# Patient Record
Sex: Female | Born: 1950 | Race: White | Hispanic: No | Marital: Single | Smoking: Former smoker
Health system: Southern US, Community
[De-identification: ages and names within clinical notes are randomized; demographics above are authoritative.]

## PROBLEM LIST (undated history)

## (undated) DIAGNOSIS — M1990 Unspecified osteoarthritis, unspecified site: Secondary | ICD-10-CM

## (undated) DIAGNOSIS — E785 Hyperlipidemia, unspecified: Secondary | ICD-10-CM

## (undated) DIAGNOSIS — K219 Gastro-esophageal reflux disease without esophagitis: Secondary | ICD-10-CM

## (undated) DIAGNOSIS — N840 Polyp of corpus uteri: Secondary | ICD-10-CM

## (undated) DIAGNOSIS — T884XXA Failed or difficult intubation, initial encounter: Secondary | ICD-10-CM

## (undated) DIAGNOSIS — F419 Anxiety disorder, unspecified: Secondary | ICD-10-CM

## (undated) DIAGNOSIS — R600 Localized edema: Secondary | ICD-10-CM

## (undated) HISTORY — DX: Polyp of corpus uteri: N84.0

## (undated) HISTORY — DX: Morbid (severe) obesity due to excess calories: E66.01

## (undated) HISTORY — PX: BREAST SURGERY: SHX581

## (undated) HISTORY — PX: KNEE ARTHROSCOPY: SUR90

## (undated) HISTORY — PX: DILATION AND CURETTAGE OF UTERUS: SHX78

## (undated) HISTORY — PX: HYSTEROSCOPY: SHX211

## (undated) HISTORY — PX: JOINT REPLACEMENT: SHX530

## (undated) HISTORY — PX: TONSILLECTOMY: SUR1361

## (undated) HISTORY — PX: BREAST CYST EXCISION: SHX579

---

## 2004-09-19 ENCOUNTER — Ambulatory Visit: Payer: Self-pay | Admitting: General Practice

## 2005-04-11 ENCOUNTER — Ambulatory Visit: Payer: Self-pay | Admitting: Anesthesiology

## 2006-09-23 ENCOUNTER — Ambulatory Visit: Payer: Self-pay | Admitting: Family Medicine

## 2007-02-19 ENCOUNTER — Ambulatory Visit: Payer: Self-pay | Admitting: General Practice

## 2007-07-06 ENCOUNTER — Ambulatory Visit: Payer: Self-pay | Admitting: General Practice

## 2009-05-15 ENCOUNTER — Ambulatory Visit: Payer: Self-pay | Admitting: General Practice

## 2009-05-30 ENCOUNTER — Inpatient Hospital Stay: Payer: Self-pay | Admitting: General Practice

## 2009-10-20 ENCOUNTER — Ambulatory Visit: Payer: Self-pay | Admitting: General Practice

## 2013-04-29 ENCOUNTER — Ambulatory Visit: Payer: Self-pay | Admitting: Internal Medicine

## 2016-10-24 ENCOUNTER — Encounter: Payer: Self-pay | Admitting: Certified Nurse Midwife

## 2016-10-24 ENCOUNTER — Ambulatory Visit (INDEPENDENT_AMBULATORY_CARE_PROVIDER_SITE_OTHER): Payer: Medicare HMO | Admitting: Certified Nurse Midwife

## 2016-10-24 VITALS — BP 129/56 | HR 83 | Ht 64.0 in | Wt 312.0 lb

## 2016-10-24 DIAGNOSIS — N950 Postmenopausal bleeding: Secondary | ICD-10-CM | POA: Insufficient documentation

## 2016-10-24 DIAGNOSIS — Z124 Encounter for screening for malignant neoplasm of cervix: Secondary | ICD-10-CM

## 2016-10-24 HISTORY — DX: Morbid (severe) obesity due to excess calories: E66.01

## 2016-10-24 NOTE — Patient Instructions (Signed)
Postmenopausal Bleeding Postmenopausal bleeding is any bleeding after menopause. Menopause is when a woman's period stops. Any type of bleeding after menopause is concerning. It should be checked by your doctor. Any treatment will depend on the cause. Follow these instructions at home: Watch your condition for any changes.  Avoid the use of tampons and douches as told by your doctor.  Change your pads often.  Get regular pelvic exams and Pap tests.  Keep all appointments for tests as told by your doctor.  Contact a doctor if:  Your bleeding lasts for more than 1 week.  You have belly (abdominal) pain.  You have bleeding after sex (intercourse). Get help right away if:  You have a fever, chills, a headache, dizziness, muscle aches, and bleeding.  You have strong pain with bleeding.  You have clumps of blood (blood clots) coming from your vagina.  You have bleeding and need more than 1 pad an hour.  You feel like you are going to pass out (faint). This information is not intended to replace advice given to you by your health care provider. Make sure you discuss any questions you have with your health care provider. Document Released: 03/12/2008 Document Revised: 11/09/2015 Document Reviewed: 12/31/2012 Elsevier Interactive Patient Education  2017 Elsevier Inc.  

## 2016-10-24 NOTE — Progress Notes (Signed)
New pt is here with c/o PMB. Started slow beginning of May and became heavier around 10/18/16 X1 week. Denies pain. LPS unknown.

## 2016-10-24 NOTE — Progress Notes (Addendum)
GYN ENCOUNTER NOTE  Subjective:       Michelle Marks is a 66 y.o. G37P1001 female is here for gynecologic evaluation of the following issues: Post menopausal bleeding. [name] became menopausal in her early forty's. On 5/4-5/5 had 2 heavy days of vaginal bleeding that then got lighter and has now stopped. She denies any cramps or aggrevating factors.  She states that she first thought it was blood in her urine. She went to her primary care to have her urine evaluated. She was then referred to [facility] for further evaluation.     Gynecologic History No LMP recorded. Patient is postmenopausal. Contraception: none Last Pap:" 6-8 yrs ago". Results were: normal per pt.  She denies any family history of endometrial cancer.   Obstetric Historyd OB History  Gravida Para Term Preterm AB Living  1 1 1     1   SAB TAB Ectopic Multiple Live Births          1    # Outcome Date GA Lbr Len/2nd Weight Sex Delivery Anes PTL Lv  1 Term 31    M Vag-Spont   LIV      History reviewed. No pertinent past medical history.  Past Surgical History:  Procedure Laterality Date  . DILATION AND CURETTAGE OF UTERUS    . HYSTEROSCOPY      No current outpatient prescriptions on file prior to visit.   No current facility-administered medications on file prior to visit.     Allergies  Allergen Reactions  . Sulfa Antibiotics     Social History   Social History  . Marital status: Single    Spouse name: N/A  . Number of children: N/A  . Years of education: N/A   Occupational History  . Not on file.   Social History Main Topics  . Smoking status: Former Games developer  . Smokeless tobacco: Never Used  . Alcohol use Yes     Comment: occas  . Drug use: No  . Sexual activity: Not Currently   Other Topics Concern  . Not on file   Social History Narrative  . No narrative on file    Family History  Problem Relation Age of Onset  . Cancer Mother   . Diabetes Mother   . Hypertension Mother   .  Cancer Sister     The following portions of the patient's history were reviewed and updated as appropriate: allergies, current medications, past family history, past medical history, past social history, past surgical history and problem list.  Review of Systems Review of Systems - General ROS: negative for - chills, fatigue, fever, hot flashes, malaise or night sweats Hematological and Lymphatic ROS: negative for - bleeding problems or swollen lymph nodes Gastrointestinal ROS: negative for - abdominal pain, blood in stools, change in bowel habits and nausea/vomiting Musculoskeletal ROS: negative for - joint pain, muscle pain or muscular weakness Genito-Urinary ROS: positive for post menopausal bleeding  Objective:   BP (!) 129/56   Pulse 83   Ht 5\' 4"  (1.626 m)   Wt (!) 312 lb (141.5 kg)   BMI 53.55 kg/m  CONSTITUTIONAL: Well-developed, morbidly obese female in no acute distress.  HENT:  Normocephalic, atraumatic.  NECK: Normal range of motion SKIN: Skin is warm and dry. No rash noted. Not diaphoretic. No erythema. No pallor. NEUROLGIC: Alert and oriented to person, place, and time.  PSYCHIATRIC: Normal mood and affect. Normal behavior. Normal judgment and thought content. CARDIOVASCULAR:Not Examined RESPIRATORY: Not Examined BREASTS: Not Examined  ABDOMEN: Soft, non distended; Non tender. Exam compromised due to body habitus PELVIC:  External Genitalia: Normal  Vagina: Normal, no blood visualized.   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 use of cane and swelling bilaterally on both legs.   Assessment:   Post menopausal bleeding   Plan:  Pap smear today, Schedule ultrasound as soon as possible. Discuss possible need for  endometrial biopsy. Will follow up with results.   Doreene BurkeAnnie Adri Schloss, CNM

## 2016-10-25 ENCOUNTER — Other Ambulatory Visit: Payer: Self-pay | Admitting: Certified Nurse Midwife

## 2016-10-25 DIAGNOSIS — N950 Postmenopausal bleeding: Secondary | ICD-10-CM

## 2016-10-29 ENCOUNTER — Encounter: Payer: Self-pay | Admitting: Certified Nurse Midwife

## 2016-10-29 LAB — PAP IG W/ RFLX HPV ASCU: PAP SMEAR COMMENT: 0

## 2016-10-30 ENCOUNTER — Ambulatory Visit (INDEPENDENT_AMBULATORY_CARE_PROVIDER_SITE_OTHER): Payer: Medicare HMO

## 2016-10-30 DIAGNOSIS — N950 Postmenopausal bleeding: Secondary | ICD-10-CM

## 2016-11-06 ENCOUNTER — Encounter: Payer: Self-pay | Admitting: Obstetrics and Gynecology

## 2016-11-06 ENCOUNTER — Ambulatory Visit (INDEPENDENT_AMBULATORY_CARE_PROVIDER_SITE_OTHER): Payer: Medicare HMO | Admitting: Obstetrics and Gynecology

## 2016-11-06 VITALS — BP 142/84 | HR 86 | Ht 64.0 in | Wt 312.0 lb

## 2016-11-06 DIAGNOSIS — N950 Postmenopausal bleeding: Secondary | ICD-10-CM | POA: Diagnosis not present

## 2016-11-06 NOTE — Progress Notes (Signed)
GYNECOLOGY PROGRESS NOTE  Subjective:    Patient ID: Michelle Marks, female    DOB: 1951-01-17, 66 y.o.   MRN: [id number]  HPI  Patient is a 66 y.o. G42P1001 female who presents for follow up of ultrasound results and further evaluation for postmenopausal bleeding. Patient was referred from Michelle Marks, CNM. Patient notes that she has had no further episodes since early May when the initial episode occurred.   The following portions of the patient's history were reviewed and updated as appropriate: allergies, current medications, past family history, past medical history, past social history, past surgical history and problem list.  Review of Systems Pertinent items noted in HPI and remainder of comprehensive ROS otherwise negative.   Objective:   Blood pressure (!) 142/84, pulse 86, height 5\' 4"  (1.626 m), weight (!) 312 lb (141.5 kg). Body mass index is 53.55 kg/m.  General appearance: alert and no distress, morbid obesity Abdomen: soft, non-tender; bowel sounds normal; no masses,  no organomegaly Pelvic: external genitalia normal, rectovaginal septum normal.  Vagina without discharge.  Cervix normal appearing, no lesions and no motion tenderness.  Uterus mobile, nontender, normal shape and size.  Adnexae non-palpable, nontender bilaterally.  Extremities: extremities normal, atraumatic, no cyanosis or edema Neurologic: Grossly normal    Cytology:  Pap (10/24/2016): NEGATIVE FOR INTRAEPITHELIAL LESION AND MALIGNANCY.    Imaging (Pelvic Ultrasound 10/30/2016):  ULTRASOUND REPORT  Location: [facility] Date of Service: 10/30/16   Indications: PMB Findings:  The uterus is anteverted and measures 9.8 x 5.9 x 7.0 cm, slightly enlarged for age. Echo texture is homogenous without evidence of focal masses.  The Endometrium is thickened and measures 26.4 mm.  Right and left ovaries were not visualized. Survey of the adnexa demonstrates no adnexal  masses. There is no free fluid in the cul de sac.  * Exam limited by patient body habitus*  Impression: 1. Thickened endometrium in a post menopausal patient.   Recommendations: 1.Clinical correlation with the patient's History and Physical Exam. 2. Patient to follow up with Dr. Valentino Saxon for possible endometrial biopsy.   Assessment:   Postmenopausal bleeding Morbid obesity  Plan:   Discussed etiologies of postmenopausal bleeding, concern about precancerous/hyperplasia or cancerous etiology (5 to 10% percent of cases). Also discussed role of unopposed estrogen exposure in leading to thickened or proliferative endometrium; and its possible correlation with endometrial hyperplasia/carcinoma.  Discussed that obesity is linked to endometrial pathology given that adipose cells produce extra estrogen (estrone) which can cause the endometrium to have a significant amount of estrogen exposure.  However, she was reassured that endometrial atrophy and endometrial polyps are the most common causes of postmenopausal bleeding.  Uterine bleeding in postmenopausal women is usually light and self-limited. Exclusion of cancer is the main objective; therefore, treatment is usually unnecessary once cancer has been excluded.  Discussed the need to evaluate with endometrial biopsy.     Endometrial Biopsy Procedure Note  The patient is positioned on the exam table in the dorsal lithotomy position. Bimanual exam confirms uterine position and size. A Graves speculum is placed into the vagina. A single toothed tenaculum is placed onto the anterior lip of the cervix. The pipette is placed into the endocervical canal and is advanced to the uterine fundus. Using a piston like technique, with vacuum created by withdrawing the stylus, the endometrial specimen is obtained and transferred to the biopsy container. Minimal bleeding is encountered. The procedure is well tolerated.   Uterine Position: posterior  Uterine  Length: 7  cm   Uterine Specimen:  Average   Post procedure instructions are given. The patient is scheduled for follow up appointment.     [name], [name], MD [facility]

## 2016-11-06 NOTE — Patient Instructions (Addendum)

## 2016-11-08 LAB — PATHOLOGY

## 2016-11-15 ENCOUNTER — Ambulatory Visit (INDEPENDENT_AMBULATORY_CARE_PROVIDER_SITE_OTHER): Payer: Medicare HMO | Admitting: Obstetrics and Gynecology

## 2016-11-15 VITALS — BP 138/69 | HR 89

## 2016-11-15 DIAGNOSIS — N950 Postmenopausal bleeding: Secondary | ICD-10-CM

## 2016-11-15 DIAGNOSIS — N840 Polyp of corpus uteri: Secondary | ICD-10-CM | POA: Diagnosis not present

## 2016-11-15 DIAGNOSIS — R938 Abnormal findings on diagnostic imaging of other specified body structures: Secondary | ICD-10-CM

## 2016-11-15 DIAGNOSIS — R9389 Abnormal findings on diagnostic imaging of other specified body structures: Secondary | ICD-10-CM

## 2016-11-15 NOTE — Patient Instructions (Signed)
You are scheduled for surgery on 12/02/2016.  Nothing to eat after midnight on day prior to surgery.  Do not take any medications unless recommended by your provider on day prior to surgery.  Do not take NSAIDs (Motrin, Aleve) or aspirin 7 days prior to surgery.  You may take Tylenol products for minor aches and pains.  You will receive a prescription for pain medications post-operatively.  You will be contacted by phone approximately 1 week prior to surgery to schedule pre-operative appointment.  Please call the office if you have any questions regarding your upcoming surgery.     Hysteroscopy Hysteroscopy is a procedure used for looking inside the womb (uterus). It may be done for various reasons, including:  To evaluate abnormal bleeding, fibroid (benign, noncancerous) tumors, polyps, scar tissue (adhesions), and possibly cancer of the uterus.  To look for lumps (tumors) and other uterine growths.  To look for causes of why a woman cannot get pregnant (infertility), causes of recurrent loss of pregnancy (miscarriages), or a lost intrauterine device (IUD).  To perform a sterilization by blocking the fallopian tubes from inside the uterus.  In this procedure, a thin, flexible tube with a tiny light and camera on the end of it (hysteroscope) is used to look inside the uterus. A hysteroscopy should be done right after a menstrual period to be sure you are not pregnant. LET [facility] CARE PROVIDER KNOW ABOUT:  Any allergies you have.  All medicines you are taking, including vitamins, herbs, eye drops, creams, and over-the-counter medicines.  Previous problems you or members of your family have had with the use of anesthetics.  Any blood disorders you have.  Previous surgeries you have had.  Medical conditions you have. RISKS AND COMPLICATIONS Generally, this is a safe procedure. However, as with any procedure, complications can occur. Possible complications include:  Putting a  hole in the uterus.  Excessive bleeding.  Infection.  Damage to the cervix.  Injury to other organs.  Allergic reaction to medicines.  Too much fluid used in the uterus for the procedure.  BEFORE THE PROCEDURE  Ask your health care provider about changing or stopping any regular medicines.  Do not take aspirin or blood thinners for 1 week before the procedure, or as directed by your health care provider. These can cause bleeding.  If you smoke, do not smoke for 2 weeks before the procedure.  In some cases, a medicine is placed in the cervix the day before the procedure. This medicine makes the cervix have a larger opening (dilate). This makes it easier for the instrument to be inserted into the uterus during the procedure.  Do not eat or drink anything for at least 8 hours before the surgery.  Arrange for someone to take you home after the procedure. PROCEDURE  You may be given a medicine to relax you (sedative). You may also be given one of the following: ? A medicine that numbs the area around the cervix (local anesthetic). ? A medicine that makes you sleep through the procedure (general anesthetic).  The hysteroscope is inserted through the vagina into the uterus. The camera on the hysteroscope sends a picture to a TV screen. This gives the surgeon a good view inside the uterus.  During the procedure, air or a liquid is put into the uterus, which allows the surgeon to see better.  Sometimes, tissue is gently scraped from inside the uterus. These tissue samples are sent to a lab for testing. What to  expect after the procedure  If you had a general anesthetic, you may be groggy for a couple hours after the procedure.  If you had a local anesthetic, you will be able to go home as soon as you are stable and feel ready.  You may have some cramping. This normally lasts for a couple days.  You may have bleeding, which varies from light spotting for a few days to  menstrual-like bleeding for 3-7 days. This is normal.  If your test results are not back during the visit, make an appointment with your health care provider to find out the results. This information is not intended to replace advice given to you by your health care provider. Make sure you discuss any questions you have with your health care provider. Document Released: 09/09/2000 Document Revised: 11/09/2015 Document Reviewed: 12/31/2012 Elsevier Interactive Patient Education  2017 [employer]

## 2016-11-15 NOTE — Progress Notes (Signed)
    GYNECOLOGY PROGRESS NOTE  Subjective:    Patient ID: Michelle Marks, female    DOB: 08-20-1950, 66 y.o.   MRN: [id number]  HPI  Patient is a 66 y.o. 391P1001 female who presents for f/u of endometrial biopsy results.  Patient currently undergoing workup for PMB  Ultrasound performed previously noted thickened endometrium. Patient notes she has had some light bleeding/spotting since the biopsy but "nothing major".   The following portions of the patient's history were reviewed and updated as appropriate: allergies, current medications, past family history, past medical history, past social history, past surgical history and problem list.  Review of Systems Pertinent items noted in HPI and remainder of comprehensive ROS otherwise negative.   Objective:   Blood pressure 138/69, pulse 89.    Wt Readings from Last 3 Encounters:  11/06/16 (!) 312 lb (141.5 kg)  10/24/16 (!) 312 lb (141.5 kg)    General appearance: alert and no distress, morbidly obese.  Remainder of exam deferred.    Pathology:  ENDOMETRIUM, BIOPSY:  POLYPOID FRAGMENTS OF PROLIFERATIVE TYPE ENDOMETRIUM SUGGESTIVE  OF BENIGN ENDOMETRIAL POLYPS. STRIPS OF BENIGN SURFACE  ENDOMETRIAL EPITHELIUM. NO HYPERPLASIA OR CARCINOMA  Assessment:   Postmenopausal bleeding Thickened endometrium Endometrial polyp  Plan:   Discussion had regarding endometrial biopsy results. Discussed that endometrial polyps are usually benign, however recommendations for removal are due to the small possibility of malignancy.  Also discussed that removal of the polyps would likely resolve her symptoms of PMB.  Discussed need for a Hysteroscopy D&C with polypectomy.  Discussed risks, benefits of procedure including bleeding, infection, uterine perforation, thromboembolic events, and need for further surgery.  Patient notes that she had difficulties with intubation with a prior surgery some time ago (notes small airway).  Discussed that it is  possible to perform under spinal anesthesia if necessary, but will need to be discussed with Anesthesiology.  The postoperative expectations were also discussed in detail. The patient also understands the alternative treatment options which were discussed in full. All questions were answered.  She was told that she will be contacted by our surgical scheduler regarding the time and date of her surgery; routine preoperative instructions of having nothing to eat or drink after midnight on the day prior to surgery and also coming to the hospital 1.5 hours prior to her time of surgery were also emphasized.  She was told she may be called for a preoperative appointment about a week prior to surgery and will be given further preoperative instructions at that visit. Printed patient education handouts about the procedure were given to the patient to review at home.  Surgery tentatively scheduled for 12/02/2016.    A total of 25 minutes were spent face-to-face with the patient during this encounter and over half of that time dealt with counseling and coordination of care.   [name], Tradarius Reinwald, MD [facility]

## 2016-11-17 ENCOUNTER — Encounter: Payer: Self-pay | Admitting: Obstetrics and Gynecology

## 2016-11-17 NOTE — H&P (Signed)
Subjective:    Patient is a 65 y.o. 21P1001 female scheduled for Hyste77roscopy D&C with polypectomy. Indications for procedure are post-menopausal bleeding, thickened endometrium, and endometrial polyps.   Pertinent Gynecological History: Menses: post-menopausal Bleeding: post menopausal bleeding Last mammogram: normal Date: 04/2013.  Ordered.  Last pap: normal Date: 10/24/2016  Discussed Blood/Blood Products: no   Menstrual History: OB History    Gravida Para Term Preterm AB Living   1 1 1     1    SAB TAB Ectopic Multiple Live Births           1      Menarche age: [age] No LMP recorded. Patient is postmenopausal.    Past Medical History:  Diagnosis Date  . Morbid obesity (HCC) 10/24/2016    Past Surgical History:  Procedure Laterality Date  . DILATION AND CURETTAGE OF UTERUS    . HYSTEROSCOPY      OB History  Gravida Para Term Preterm AB Living  1 1 1     1   SAB TAB Ectopic Multiple Live Births          1    # Outcome Date GA Lbr Len/2nd Weight Sex Delivery Anes PTL Lv  1 Term 631983    M Vag-Spont   LIV      Social History   Social History  . Marital status: Single    Spouse name: N/A  . Number of children: N/A  . Years of education: N/A   Social History Main Topics  . Smoking status: Former Games developermoker  . Smokeless tobacco: Never Used  . Alcohol use Yes     Comment: occas  . Drug use: No  . Sexual activity: Not Currently    Birth control/ protection: None   Other Topics Concern  . Not on file   Social History Narrative  . No narrative on file    Family History  Problem Relation Age of Onset  . Cancer Mother   . Diabetes Mother   . Hypertension Mother   . Cancer Sister      (Not in a hospital admission)  Allergies  Allergen Reactions  . Sulfa Antibiotics     Review of Systems Constitutional: No recent fever/chills/sweats Respiratory: No recent cough/bronchitis Cardiovascular: No chest pain Gastrointestinal: No recent  nausea/vomiting/diarrhea Genitourinary: No UTI symptoms Hematologic/lymphatic:No history of coagulopathy or recent blood thinner use    Objective:    BP 138/69 (BP Location: Right Arm, Patient Position: Sitting)   Pulse 89   Wt Readings from Last 3 Encounters:  11/06/16 (!) 312 lb (141.5 kg)  10/24/16 (!) 312 lb (141.5 kg)     General:   Normal, morbidly obese  Skin:   normal  HEENT:  Normal  Neck:  Supple without Adenopathy or Thyromegaly  Lungs:   Heart:              Breasts:   Abdomen:  Pelvis:  M/S   Extremeties:  Neuro:    clear to auscultation bilaterally   Normal without murmur   Not Examined   soft, non-tender; bowel sounds normal; no masses,  no organomegaly   Exam deferred to OR  No CVAT  Warm/Dry   Normal          Assessment:    Postmenopausal bleeding Thickened endometrium Endometrial polyp   Plan:   Counseling: Procedure, risks, reasons, benefits and complications (including injury to bowel, bladder, major blood vessel, ureter, bleeding, possibility of transfusion, infection, or fistula formation) reviewed in detail.  Consent to be signed. Preop testing ordered. Instructions reviewed, including NPO after midnight. Scheduled for surgery 12/02/2016.     Hildred Laser, MD [facility]

## 2016-11-26 ENCOUNTER — Encounter
Admission: RE | Admit: 2016-11-26 | Discharge: 2016-11-26 | Disposition: A | Discharge status: Home / Self Care | Payer: Medicare HMO | Source: Ambulatory Visit | Attending: Obstetrics and Gynecology | Admitting: Obstetrics and Gynecology

## 2016-11-26 ENCOUNTER — Ambulatory Visit (INDEPENDENT_AMBULATORY_CARE_PROVIDER_SITE_OTHER): Payer: Medicare HMO | Admitting: Obstetrics and Gynecology

## 2016-11-26 ENCOUNTER — Other Ambulatory Visit: Payer: Self-pay

## 2016-11-26 VITALS — BP 144/79 | HR 76 | Ht 64.0 in | Wt 312.0 lb

## 2016-11-26 DIAGNOSIS — I4510 Unspecified right bundle-branch block: Secondary | ICD-10-CM | POA: Diagnosis not present

## 2016-11-26 DIAGNOSIS — R938 Abnormal findings on diagnostic imaging of other specified body structures: Secondary | ICD-10-CM

## 2016-11-26 DIAGNOSIS — N950 Postmenopausal bleeding: Secondary | ICD-10-CM

## 2016-11-26 DIAGNOSIS — Z01812 Encounter for preprocedural laboratory examination: Secondary | ICD-10-CM | POA: Diagnosis not present

## 2016-11-26 DIAGNOSIS — N840 Polyp of corpus uteri: Secondary | ICD-10-CM

## 2016-11-26 DIAGNOSIS — Z01818 Encounter for other preprocedural examination: Secondary | ICD-10-CM | POA: Diagnosis not present

## 2016-11-26 DIAGNOSIS — Z01810 Encounter for preprocedural cardiovascular examination: Secondary | ICD-10-CM | POA: Diagnosis present

## 2016-11-26 DIAGNOSIS — R9389 Abnormal findings on diagnostic imaging of other specified body structures: Secondary | ICD-10-CM

## 2016-11-26 HISTORY — DX: Anxiety disorder, unspecified: F41.9

## 2016-11-26 HISTORY — DX: Gastro-esophageal reflux disease without esophagitis: K21.9

## 2016-11-26 HISTORY — DX: Hyperlipidemia, unspecified: E78.5

## 2016-11-26 HISTORY — DX: Unspecified osteoarthritis, unspecified site: M19.90

## 2016-11-26 HISTORY — DX: Localized edema: R60.0

## 2016-11-26 HISTORY — DX: Failed or difficult intubation, initial encounter: T88.4XXA

## 2016-11-26 LAB — CBC
HEMATOCRIT: 40.8 % (ref 35.0–47.0)
HEMOGLOBIN: 14 g/dL (ref 12.0–16.0)
MCH: 29.3 pg (ref 26.0–34.0)
MCHC: 34.3 g/dL (ref 32.0–36.0)
MCV: 85.5 fL (ref 80.0–100.0)
Platelets: 251 10*3/uL (ref 150–440)
RBC: 4.77 MIL/uL (ref 3.80–5.20)
RDW: 14.6 % — AB (ref 11.5–14.5)
WBC: 7.5 10*3/uL (ref 3.6–11.0)

## 2016-11-26 LAB — POTASSIUM: Potassium: 3.1 mmol/L — ABNORMAL LOW (ref 3.5–5.1)

## 2016-11-26 NOTE — Progress Notes (Signed)
    GYNECOLOGY PROGRESS NOTE  Subjective:    Patient ID: Michelle Marks, female    DOB: 09/05/1950, 66 y.o.   MRN: [id number]  HPI  Patient is a 66 y.o. 81P1001 female who presents for pre-operative exam.  Patient is scheduled for Hysteroscopy D&C with polypectomy for endometrial thickening, PMB, and endometrial polyp.  Patient denies complaints today.   The following portions of the patient's history were reviewed and updated as appropriate: allergies, current medications, past family history, past medical history, past social history, past surgical history and problem list.  Review of Systems Pertinent items noted in HPI and remainder of comprehensive ROS otherwise negative.   Objective:   Blood pressure (!) 144/79, pulse 76, height 5\' 4"  (1.626 m), weight (!) 312 lb (141.5 kg). General appearance: alert and no distress See H&P for detailed exam.    Assessment:    Postmenopausal bleeding Thickened endometrium Endometrial polyp Morbid obesity  Plan:   Counseling: Procedure, risks, reasons, benefits and complications (including injury to bowel, bladder, major blood vessel, bleeding, possibility of transfusion, infection, or fistula formation) reviewed in detail.  Preop testing ordered. Patient notes that she has had difficulties with intubation in the past, notes "narrow airway". Discussed possibility of performing under spinal anesthesia if necessary, but will need to discuss with Anesthesiology.  Instructions reviewed, including NPO after midnight.  Patient notes issues with certain pain medications in the past.  Notes that she can take Aleve and Percocet without difficulty. Will note when prescription medications needed.     [name], Duchess Armendarez, MD [facility]

## 2016-11-26 NOTE — H&P (Signed)
GYNECOLOGY PRE-OPERATIVE HISTORY AND PHYSICAL  Subjective:    Patient is a 66 y.o. G1P1046female scheduled for Hysteroscopy D&C with polypectomy. Indications for procedure are postmenopausal bleeding, thickened endometrium, and endometrial polyp.   Pertinent Gynecological History: Menses: post-menopausal Bleeding: post menopausal bleeding (1 episode, lasted several days) Last mammogram: normal Date: 04/2013 Last pap: normal Date: 10/2016  Discussed Blood/Blood Products: no   Menstrual History: Menarche age: 89 No LMP recorded. Patient is postmenopausal.    OB History  Gravida Para Term Preterm AB Living  1 1 1     1   SAB TAB Ectopic Multiple Live Births          1    # Outcome Date GA Lbr Len/2nd Weight Sex Delivery Anes PTL Lv  1 Term 58    M Vag-Spont   LIV      Past Medical History:  Diagnosis Date  . Morbid obesity (HCC) 10/24/2016    Past Surgical History:  Procedure Laterality Date  . DILATION AND CURETTAGE OF UTERUS    . HYSTEROSCOPY      Family History  Problem Relation Age of Onset  . Cancer Mother   . Diabetes Mother   . Hypertension Mother   . Cancer Sister   . Diabetes Father   . Pancreatitis Father     Social History   Social History  . Marital status: Single    Spouse name: N/A  . Number of children: N/A  . Years of education: N/A   Social History Main Topics  . Smoking status: Former Games developer  . Smokeless tobacco: Never Used  . Alcohol use Yes     Comment: occas  . Drug use: No  . Sexual activity: Not Currently    Birth control/ protection: None   Other Topics Concern  . Not on file   Social History Narrative  . No narrative on file    Current Outpatient Prescriptions on File Prior to Visit  Medication Sig Dispense Refill  . atorvastatin (LIPITOR) 10 MG tablet Take 10 mg by mouth every evening.     . RABEprazole (ACIPHEX) 20 MG tablet Take 20 mg by mouth every evening.     . torsemide (DEMADEX) 100 MG tablet Take 100  mg by mouth daily.     No current facility-administered medications on file prior to visit.      Allergies  Allergen Reactions  . Sulfa Antibiotics     Review of Systems Constitutional: No recent fever/chills/sweats Respiratory: No recent cough/bronchitis Cardiovascular: No chest pain Gastrointestinal: No recent nausea/vomiting/diarrhea Genitourinary: No UTI symptoms.  Positive for postmenopausal bleeding Hematologic/lymphatic:No history of coagulopathy or recent blood thinner use    Objective:    BP (!) 144/79 (BP Location: Left Arm, Patient Position: Sitting, Cuff Size: Large)   Pulse 76   Ht 5\' 4"  (1.626 m)   Wt (!) 312 lb (141.5 kg)   BMI 53.55 kg/m   General:   Normal, morbidly obese  Skin:   normal  HEENT:  Normal  Neck:  Supple without Adenopathy or Thyromegaly  Lungs:   Heart:              Breasts:   Abdomen:  Pelvis:  M/S   Extremeties:  Neuro:    clear to auscultation bilaterally   Normal without murmur   Not Examined   soft, non-tender; bowel sounds normal; no masses,  no organomegaly   Exam deferred to OR  No CVAT  Warm/Dry  Normal  Assessment:     Postmenopausal bleeding Thickened endometrium Endometrial polyp Morbid obesity  Plan:    Counseling: Procedure, risks, reasons, benefits and complications (including injury to bowel, bladder, major blood vessel, bleeding, possibility of transfusion, infection, or fistula formation) reviewed in detail. Consent signed. Preop testing ordered. Patient notes that she has had difficulties with intubation in the past, notes "narrow airway". Discussed possibility of performing under spinal anesthesia if necessary, but will need to discuss with Anesthesiology.  Instructions reviewed, including NPO after midnight.   [name], Jillyan Plitt, MD [facility]

## 2016-11-26 NOTE — Patient Instructions (Signed)
  Your procedure is scheduled on: December 02, 2016 [facility](MONDAY) Report to Same Day Surgery 2nd floor medical mall (Medical Mall Entrance-take elevator on left to 2nd floor.  Check in with surgery information desk.) To find out your arrival time please call [phone] between 1PM - 3PM on November 29, 2016 (FRIDAY)  Remember: Instructions that are not followed completely may result in serious medical risk, up to and including death, or upon the discretion of your surgeon and anesthesiologist your surgery may need to be rescheduled.    _x___ 1. Do not eat food or drink liquids after midnight. No gum chewing or hard candies                                 __x__ 2. No Alcohol for 24 hours before or after surgery.   __x__3. No Smoking for 24 prior to surgery.   ____  4. Bring all medications with you on the day of surgery if instructed.    __x__ 5. Notify your doctor if there is any change in your medical condition     (cold, fever, infections).     Do not wear jewelry, make-up, hairpins, clips or nail polish.  Do not wear lotions, powders, or perfumes. You may wear deodorant.  Do not shave 48 hours prior to surgery. Men may shave face and neck.  Do not bring valuables to the hospital.    [facility] is [facility] responsible for any belongings or valuables.               Contacts, dentures or bridgework may not be worn into surgery.  Leave your suitcase in the car. After surgery it may be brought to your room.  For patients admitted to the hospital, discharge time is determined by your  treatment team                       Patients discharged the day of surgery will not be allowed to drive home.  You will need someone to drive you home and stay with you the night of your procedure.    Please read over the following fact sheets that you were given:   [facility] Preparing for [facility] and or MRSA Information    _x___ Take the following medications with a sip of water the morning of surgery :  1.  ACIPHEX  2.  3.  4.  5.  6.  ____Fleets enema or Magnesium Citrate as directed.   ___ Use CHG Soap or sage wipes as directed on instruction sheet   ____ Use inhalers on the day of surgery and bring to hospital day of surgery  ____ Stop Metformin and Janumet 2 days prior to surgery.    ____ Take 1/2 of usual insulin dose the night before surgery and none on the morning surgery       _x___ Follow recommendations from Cardiologist, Pulmonologist or PCP regarding          stopping Aspirin, Coumadin, Pllavix ,Eliquis, Effient, or Pradaxa, and Pletal.  X____Stop Anti-inflammatories such as Advil, Aleve, Ibuprofen, Motrin, Naproxen, Naprosyn, Goodies powders or aspirin products. OK to take Tylenol   _x___ Stop supplements until after surgery.  But may continue Vitamin D, Vitamin B, and multivitamin.      ____ Bring C-Pap to the hospital.

## 2016-11-26 NOTE — Pre-Procedure Instructions (Signed)
Patient stated she was "difficult to intubate with previous hysteroscopy surgery " . Dr. Priscella MannPenwarden made aware , and also of patient BMI.

## 2016-11-27 NOTE — Pre-Procedure Instructions (Signed)
EKG COMPARED WITH OLD BY DR P CARROLL AND OK

## 2016-11-28 ENCOUNTER — Other Ambulatory Visit: Payer: Self-pay

## 2016-11-28 ENCOUNTER — Telehealth: Payer: Self-pay

## 2016-11-28 DIAGNOSIS — E876 Hypokalemia: Secondary | ICD-10-CM

## 2016-11-28 MED ORDER — POTASSIUM CHLORIDE CRYS ER 20 MEQ PO TBCR: 40.0000 meq | EXTENDED_RELEASE_TABLET | Freq: Every day | ORAL | 0 refills | Status: DC

## 2016-11-28 MED ORDER — POTASSIUM CHLORIDE ER 8 MEQ PO CPCR: 8.0000 meq | ORAL_CAPSULE | Freq: Every day | ORAL | 0 refills | Status: DC

## 2016-11-28 NOTE — Telephone Encounter (Signed)
Pt calls regarding rx that was sent in for her (potassium 20MEQ) pt states that due to a small throat she cannot take capsules. Pt asks can she take 8MEQ capsules 5 times daily as a substitute. After consulting with Dr.David Logan BoresEvans about this he states that this should be fine. New RX sent in for capsules. Pt and pharmacy informed.

## 2016-11-28 NOTE — Telephone Encounter (Signed)
-----   Message from Hildred LaserAnika Cherry, MD sent at 11/27/2016 11:33 PM EDT ----- Please inform patient of need for potassium supplementation prior to surgery.  Please have her take 40 mg of K-Dur daily x 3 days.

## 2016-11-28 NOTE — Telephone Encounter (Signed)
Called pt, no answer. LM for pt informing her of information below. RX sent in.

## 2016-12-02 ENCOUNTER — Ambulatory Visit: Payer: Medicare HMO | Admitting: Anesthesiology

## 2016-12-02 ENCOUNTER — Encounter
Admission: RE | Disposition: A | Discharge status: Home / Self Care | Payer: Self-pay | Source: Ambulatory Visit | Attending: Obstetrics and Gynecology

## 2016-12-02 ENCOUNTER — Ambulatory Visit
Admission: RE | Admit: 2016-12-02 | Discharge: 2016-12-02 | Disposition: A | Discharge status: Home / Self Care | Payer: Medicare HMO | Source: Ambulatory Visit | Attending: Obstetrics and Gynecology | Admitting: Obstetrics and Gynecology

## 2016-12-02 ENCOUNTER — Encounter: Payer: Self-pay | Admitting: *Deleted

## 2016-12-02 DIAGNOSIS — Z6841 Body Mass Index (BMI) 40.0 and over, adult: Secondary | ICD-10-CM | POA: Insufficient documentation

## 2016-12-02 DIAGNOSIS — N950 Postmenopausal bleeding: Secondary | ICD-10-CM

## 2016-12-02 DIAGNOSIS — Z79899 Other long term (current) drug therapy: Secondary | ICD-10-CM | POA: Insufficient documentation

## 2016-12-02 DIAGNOSIS — N840 Polyp of corpus uteri: Secondary | ICD-10-CM | POA: Diagnosis not present

## 2016-12-02 DIAGNOSIS — Z87891 Personal history of nicotine dependence: Secondary | ICD-10-CM | POA: Insufficient documentation

## 2016-12-02 DIAGNOSIS — R938 Abnormal findings on diagnostic imaging of other specified body structures: Secondary | ICD-10-CM | POA: Diagnosis not present

## 2016-12-02 HISTORY — PX: HYSTEROSCOPY WITH D & C: SHX1775

## 2016-12-02 HISTORY — PX: CERVICAL POLYPECTOMY: SHX88

## 2016-12-02 LAB — POCT I-STAT 4, (NA,K, GLUC, HGB,HCT)
Glucose, Bld: 125 mg/dL — ABNORMAL HIGH (ref 65–99)
HEMATOCRIT: 40 % (ref 36.0–46.0)
HEMOGLOBIN: 13.6 g/dL (ref 12.0–15.0)
Potassium: 3.6 mmol/L (ref 3.5–5.1)
SODIUM: 140 mmol/L (ref 135–145)

## 2016-12-02 SURGERY — DILATATION AND CURETTAGE /HYSTEROSCOPY
Anesthesia: General | Wound class: Clean Contaminated

## 2016-12-02 MED ORDER — FENTANYL CITRATE (PF) 100 MCG/2ML IJ SOLN
25.0000 ug | INTRAMUSCULAR | Status: DC | PRN
  Administered 2016-12-02 (×2): 25 ug via INTRAVENOUS

## 2016-12-02 MED ORDER — DEXAMETHASONE SODIUM PHOSPHATE 10 MG/ML IJ SOLN
INTRAMUSCULAR | Status: AC
  Filled 2016-12-02: qty 1

## 2016-12-02 MED ORDER — FENTANYL CITRATE (PF) 100 MCG/2ML IJ SOLN
INTRAMUSCULAR | Status: DC | PRN
  Administered 2016-12-02: 100 ug via INTRAVENOUS

## 2016-12-02 MED ORDER — MIDAZOLAM HCL 2 MG/2ML IJ SOLN
INTRAMUSCULAR | Status: DC | PRN
  Administered 2016-12-02 (×2): 1 mg via INTRAVENOUS

## 2016-12-02 MED ORDER — KETOROLAC TROMETHAMINE 30 MG/ML IJ SOLN
INTRAMUSCULAR | Status: AC
  Filled 2016-12-02: qty 1

## 2016-12-02 MED ORDER — ROCURONIUM BROMIDE 100 MG/10ML IV SOLN
INTRAVENOUS | Status: DC | PRN
  Administered 2016-12-02: 20 mg via INTRAVENOUS

## 2016-12-02 MED ORDER — DEXAMETHASONE SODIUM PHOSPHATE 10 MG/ML IJ SOLN
INTRAMUSCULAR | Status: DC | PRN
  Administered 2016-12-02: 5 mg via INTRAVENOUS

## 2016-12-02 MED ORDER — MEPERIDINE HCL 50 MG/ML IJ SOLN: 6.2500 mg | INTRAMUSCULAR | Status: DC | PRN

## 2016-12-02 MED ORDER — FENTANYL CITRATE (PF) 100 MCG/2ML IJ SOLN
INTRAMUSCULAR | Status: AC
  Administered 2016-12-02: 25 ug via INTRAVENOUS
  Filled 2016-12-02: qty 2

## 2016-12-02 MED ORDER — OXYCODONE-ACETAMINOPHEN 5-325 MG PO TABS: 1.0000 | ORAL_TABLET | Freq: Four times a day (QID) | ORAL | 0 refills | Status: DC | PRN

## 2016-12-02 MED ORDER — NAPROXEN 500 MG PO TABS: 500.0000 mg | ORAL_TABLET | Freq: Two times a day (BID) | ORAL | 1 refills | Status: DC

## 2016-12-02 MED ORDER — ACETAMINOPHEN 10 MG/ML IV SOLN
INTRAVENOUS | Status: DC | PRN
  Administered 2016-12-02: 1000 mg via INTRAVENOUS

## 2016-12-02 MED ORDER — PROPOFOL 10 MG/ML IV BOLUS
INTRAVENOUS | Status: AC
  Filled 2016-12-02: qty 20

## 2016-12-02 MED ORDER — PROPOFOL 10 MG/ML IV BOLUS
INTRAVENOUS | Status: DC | PRN
  Administered 2016-12-02: 120 mg via INTRAVENOUS

## 2016-12-02 MED ORDER — PROMETHAZINE HCL 25 MG/ML IJ SOLN: 6.2500 mg | INTRAMUSCULAR | Status: DC | PRN

## 2016-12-02 MED ORDER — OXYCODONE HCL 5 MG PO TABS: 5.0000 mg | ORAL_TABLET | Freq: Once | ORAL | Status: DC | PRN

## 2016-12-02 MED ORDER — OXYCODONE HCL 5 MG/5ML PO SOLN: 5.0000 mg | Freq: Once | ORAL | Status: DC | PRN

## 2016-12-02 MED ORDER — LACTATED RINGERS IV SOLN
INTRAVENOUS | Status: DC
  Administered 2016-12-02: 09:00:00 via INTRAVENOUS

## 2016-12-02 MED ORDER — SUCCINYLCHOLINE CHLORIDE 20 MG/ML IJ SOLN
INTRAMUSCULAR | Status: AC
  Filled 2016-12-02: qty 1

## 2016-12-02 MED ORDER — FENTANYL CITRATE (PF) 100 MCG/2ML IJ SOLN
INTRAMUSCULAR | Status: AC
  Filled 2016-12-02: qty 2

## 2016-12-02 MED ORDER — SUGAMMADEX SODIUM 500 MG/5ML IV SOLN
INTRAVENOUS | Status: AC
  Filled 2016-12-02: qty 5

## 2016-12-02 MED ORDER — LACTATED RINGERS IV SOLN: INTRAVENOUS | Status: DC

## 2016-12-02 MED ORDER — SUGAMMADEX SODIUM 500 MG/5ML IV SOLN
INTRAVENOUS | Status: DC | PRN
  Administered 2016-12-02: 280 mg via INTRAVENOUS

## 2016-12-02 MED ORDER — ONDANSETRON HCL 4 MG/2ML IJ SOLN
INTRAMUSCULAR | Status: AC
  Filled 2016-12-02: qty 2

## 2016-12-02 MED ORDER — MIDAZOLAM HCL 2 MG/2ML IJ SOLN
INTRAMUSCULAR | Status: AC
  Filled 2016-12-02: qty 2

## 2016-12-02 MED ORDER — KETOROLAC TROMETHAMINE 30 MG/ML IJ SOLN
INTRAMUSCULAR | Status: DC | PRN
  Administered 2016-12-02: 30 mg via INTRAVENOUS

## 2016-12-02 MED ORDER — ONDANSETRON HCL 4 MG/2ML IJ SOLN
INTRAMUSCULAR | Status: DC | PRN
  Administered 2016-12-02: 4 mg via INTRAVENOUS

## 2016-12-02 MED ORDER — SUCCINYLCHOLINE CHLORIDE 20 MG/ML IJ SOLN
INTRAMUSCULAR | Status: DC | PRN
  Administered 2016-12-02: 100 mg via INTRAVENOUS

## 2016-12-02 MED ORDER — ACETAMINOPHEN NICU IV SYRINGE 10 MG/ML
INTRAVENOUS | Status: AC
  Filled 2016-12-02: qty 1

## 2016-12-02 MED ORDER — LIDOCAINE HCL (CARDIAC) 20 MG/ML IV SOLN
INTRAVENOUS | Status: DC | PRN
  Administered 2016-12-02: 50 mg via INTRAVENOUS

## 2016-12-02 SURGICAL SUPPLY — 17 items
BAG INFUSER PRESSURE 100CC (MISCELLANEOUS) ×3 IMPLANT
CATH ROBINSON RED A/P 16FR (CATHETERS) ×3 IMPLANT
DRAPE INCISE 23X17 IOBAN STRL (DRAPES) ×4
DRAPE INCISE IOBAN 23X17 STRL (DRAPES) ×2 IMPLANT
GLOVE BIO SURGEON STRL SZ 6.5 (GLOVE) ×2 IMPLANT
GLOVE BIO SURGEONS STRL SZ 6.5 (GLOVE) ×1
GLOVE INDICATOR 7.0 STRL GRN (GLOVE) ×3 IMPLANT
GOWN STRL REUS W/ TWL LRG LVL3 (GOWN DISPOSABLE) ×2 IMPLANT
GOWN STRL REUS W/TWL LRG LVL3 (GOWN DISPOSABLE) ×4
IV LACTATED RINGERS 1000ML (IV SOLUTION) ×3 IMPLANT
KIT RM TURNOVER CYSTO AR (KITS) ×3 IMPLANT
PACK DNC HYST (MISCELLANEOUS) ×3 IMPLANT
PAD OB MATERNITY 4.3X12.25 (PERSONAL CARE ITEMS) ×3 IMPLANT
PAD PREP 24X41 OB/GYN DISP (PERSONAL CARE ITEMS) ×3 IMPLANT
SOL PREP PVP 2OZ (MISCELLANEOUS) ×3
SOLUTION PREP PVP 2OZ (MISCELLANEOUS) ×1 IMPLANT
SURGILUBE 2OZ TUBE FLIPTOP (MISCELLANEOUS) ×3 IMPLANT

## 2016-12-02 NOTE — Op Note (Signed)
Procedure(s): DILATATION AND CURETTAGE /HYSTEROSCOPY ENDOMETRIAL POLYPECTOMY Procedure Note  Michelle Marks female 66 y.o. 12/02/2016  Indications: The patient is a 66 y.o. 441P1001 female with endometrial polyp, thickened endometrium, postmenopausal bleeding, morbid obesity  Pre-operative Diagnosis: Endometrial polyp, thickened endometrium, postmenopausal bleeding, morbid obesity  Post-operative Diagnosis: Same  Surgeon: Hildred LaserAnika Phuong Moffatt, MD  Assistants: None  Anesthesia: General endotracheal anesthesia   Procedure Details: The patient was seen in the Holding Room. The risks, benefits, complications, treatment options, and expected outcomes were discussed with the patient.  The patient concurred with the proposed plan, giving informed consent.  The site of surgery properly noted/marked. The patient was taken to the Operating Room, identified as Michelle Marks and the procedure verified as Procedure(s) (LRB): DILATATION AND CURETTAGE /HYSTEROSCOPY (N/A), ENDOMETRIAL POLYPECTOMY (N/A). A Time Out was held and the above information confirmed.  The patient was then placed under general anesthesia without difficulty.  She was then prepped and draped in the normal sterile fashion, and placed in the dorsal lithotomy position.  An exam under anesthesia was performed with the findings noted above.  Straight catheterization was performed. A sterile speculum was inserted into vagina. A single-tooth tenaculum was used to grasp the anterior lip of the cervix. Cervical dilation was performed. A 5 mm hysteroscope was introduced into the uterus under direct visualization. The cavity was allowed to fill, and then the entire cavity was explored with the findings described above. The hysteroscope was removed, and polyp forceps were used to attempt to grasp the polypoid tissue.  Next, a sharp curette was then passed into the uterus and endometrial sampling was collected for pathology. The hysteroscope was then  re-introduced for final survey, with removal of the polyps noted.  The hysteroscope was removed from the patient's uterine cavity. The tenaculum was removed and excellent hemostasis was noted. The speculum was removed from the vagina.   All instrument and sponge counts were correct at the end of the procedure x 2.  The patient tolerated the procedure well.  She was awakened and taken to the PACU in stable condition.   Findings: Uterus sounded to 12.5 cm Proliferative endometrium.  Tubal ostia were not visualized bilaterally. Several endometrial polyps present.    Estimated Blood Loss:  20 ml      Drains: straight catheterization with 10 cc at start of procedure.          Total IV Fluids:  400 ml  Specimens:  Endometrial curettings, endometrial polyp         Implants: None         Complications:  None; patient tolerated the procedure well.         Disposition: PACU - hemodynamically stable.         Condition: stable   Hildred LaserAnika Irl Bodie, MD [facility]

## 2016-12-02 NOTE — H&P (Signed)
UPDATE TO PREVIOUS HISTORY AND PHYSICAL  The patient has been seen and examined.  H&P is up to date, no changes noted. Michelle Marks  is a 66 y.o. [id number] scheduled for Hysteroscopy D&C with polypectomy. Indications for procedure are postmenopausal bleeding, thickened endometrium, and endometrial polyp. All questions answered. Patient can proceed to the OR for scheduled procedure.   [name], Tyrease Vandeberg, MD 12/02/2016 9:59 AM

## 2016-12-02 NOTE — Anesthesia Postprocedure Evaluation (Signed)
Anesthesia Post Note  Patient: Michelle Marks  Procedure(s) Performed: Procedure(s) (LRB): DILATATION AND CURETTAGE /HYSTEROSCOPY (N/A) CERVICAL POLYPECTOMY (N/A)  Patient location during evaluation: PACU Anesthesia Type: General Level of consciousness: awake and alert and oriented Pain management: pain level controlled Vital Signs Assessment: post-procedure vital signs reviewed and stable Respiratory status: spontaneous breathing, nonlabored ventilation and respiratory function stable Cardiovascular status: blood pressure returned to baseline and stable Postop Assessment: no signs of nausea or vomiting Anesthetic complications: no     Last Vitals:  Vitals:   12/02/16 1208 12/02/16 1213  BP:  (!) 131/51  Pulse: 67 69  Resp: 12 17  Temp:  36.7 C    Last Pain:  Vitals:   12/02/16 1208  TempSrc:   PainSc: 4                  Flavio Lindroth

## 2016-12-02 NOTE — Anesthesia Preprocedure Evaluation (Signed)
Anesthesia Evaluation  Patient identified by MRN, date of birth, ID band Patient awake    Reviewed: Allergy & Precautions, NPO status , Patient's Chart, lab work & pertinent test results  History of Anesthesia Complications (+) DIFFICULT AIRWAY and history of anesthetic complications (hx of difficult intubation 20 yrs ago)  Airway Mallampati: II  TM Distance: <3 FB Neck ROM: Full    Dental  (+) Poor Dentition, Missing   Pulmonary neg sleep apnea, neg COPD, former smoker,    breath sounds clear to auscultation- rhonchi (-) wheezing      Cardiovascular Exercise Tolerance: Good (-) hypertension(-) CAD and (-) Past MI  Rhythm:Regular Rate:Normal - Systolic murmurs and - Diastolic murmurs    Neuro/Psych Anxiety negative neurological ROS     GI/Hepatic Neg liver ROS, GERD  ,  Endo/Other  negative endocrine ROSneg diabetes  Renal/GU negative Renal ROS     Musculoskeletal  (+) Arthritis ,   Abdominal (+) + obese,   Peds  Hematology negative hematology ROS (+)   Anesthesia Other Findings Past Medical History: No date: Anxiety No date: Arthritis No date: Difficult intubation No date: Edema, leg No date: GERD (gastroesophageal reflux disease) No date: Hyperlipidemia 10/24/2016: Morbid obesity (HCC)   Reproductive/Obstetrics                             Anesthesia Physical Anesthesia Plan  ASA: III  Anesthesia Plan: General   Post-op Pain Management:    Induction: Intravenous  PONV Risk Score and Plan: 2 and Ondansetron and Dexamethasone  Airway Management Planned: Oral ETT and Video Laryngoscope Planned  Additional Equipment:   Intra-op Plan:   Post-operative Plan: Extubation in OR  Informed Consent: I have reviewed the patients History and Physical, chart, labs and discussed the procedure including the risks, benefits and alternatives for the proposed anesthesia with the patient  or authorized representative who has indicated his/her understanding and acceptance.   Dental advisory given  Plan Discussed with: CRNA and Anesthesiologist  Anesthesia Plan Comments:         Anesthesia Quick Evaluation

## 2016-12-02 NOTE — Discharge Instructions (Signed)
Dilation and Curettage or Vacuum Curettage, Care After °These instructions give you information about caring for yourself after your procedure. Your doctor may also give you more specific instructions. Call your doctor if you have any problems or questions after your procedure. °Follow these instructions at home: °Activity °· Do not drive or use heavy machinery while taking prescription pain medicine. °· For 24 hours after your procedure, avoid driving. °· Take short walks often, followed by rest periods. Ask your doctor what activities are safe for you. After one or two days, you may be able to return to your normal activities. °· Do not lift anything that is heavier than 10 lb (4.5 kg) until your doctor approves. °· For at least 2 weeks, or as long as told by your doctor: °? Do not douche. °? Do not use tampons. °? Do not have sex. °General instructions °· Take over-the-counter and prescription medicines only as told by your doctor. This is very important if you take blood thinning medicine. °· Do not take baths, swim, or use a hot tub until your doctor approves. Take showers instead of baths. °· Wear compression stockings as told by your doctor. °· It is up to you to get the results of your procedure. Ask your doctor when your results will be ready. °· Keep all follow-up visits as told by your doctor. This is important. °Contact a doctor if: °· You have very bad cramps that get worse or do not get better with medicine. °· You have very bad pain in your belly (abdomen). °· You cannot drink fluids without throwing up (vomiting). °· You get pain in a different part of the area between your belly and thighs (pelvis). °· You have bad-smelling discharge from your vagina. °· You have a rash. °Get help right away if: °· You are bleeding a lot from your vagina. A lot of bleeding means soaking more than one sanitary pad in an hour, for 2 hours in a row. °· You have clumps of blood (blood clots) coming from your  vagina. °· You have a fever or chills. °· Your belly feels very tender or hard. °· You have chest pain. °· You have trouble breathing. °· You cough up blood. °· You feel dizzy. °· You feel light-headed. °· You pass out (faint). °· You have pain in your neck or shoulder area. °Summary °· Take short walks often, followed by rest periods. Ask your doctor what activities are safe for you. After one or two days, you may be able to return to your normal activities. °· Do not lift anything that is heavier than 10 lb (4.5 kg) until your doctor approves. °· Do not take baths, swim, or use a hot tub until your doctor approves. Take showers instead of baths. °· Contact your doctor if you have any symptoms of infection, like bad-smelling discharge from your vagina. °This information is not intended to replace advice given to you by your health care provider. Make sure you discuss any questions you have with your health care provider. °Document Released: 03/12/2008 Document Revised: 02/19/2016 Document Reviewed: 02/19/2016 °Elsevier Interactive Patient Education © 2017 [employer] °AMBULATORY SURGERY  °DISCHARGE INSTRUCTIONS ° ° °1) The drugs that you were given will stay in your system until tomorrow so for the next 24 hours you should not: ° °A) Drive an automobile °B) Make any legal decisions °C) Drink any alcoholic beverage ° ° °2) You may resume regular meals tomorrow.  Today it is better to start   with liquids and gradually work up to solid foods. ° °You may eat anything you prefer, but it is better to start with liquids, then soup and crackers, and gradually work up to solid foods. ° ° °3) Please notify your doctor immediately if you have any unusual bleeding, trouble breathing, redness and pain at the surgery site, drainage, fever, or pain not relieved by medication. ° ° ° °4) Additional Instructions: ° ° ° ° ° ° ° °Please contact your physician with any problems or Same Day Surgery at [phone], Monday through  Friday 6 am to 4 pm, or [facility] [place] Main number at [phone]. °

## 2016-12-02 NOTE — Anesthesia Procedure Notes (Signed)
Procedure Name: Intubation Date/Time: 12/02/2016 10:28 AM Performed by: Omer JackWEATHERLY, Michelle Marks Pre-anesthesia Checklist: Patient identified, Patient being monitored, Timeout performed, Emergency Drugs available and Suction available Patient Re-evaluated:Patient Re-evaluated prior to inductionOxygen Delivery Method: Circle system utilized Preoxygenation: Pre-oxygenation with 100% oxygen Intubation Type: IV induction Ventilation: Two handed mask ventilation required and Mask ventilation without difficulty Laryngoscope Size: 3 and McGraph Grade View: Grade I Tube type: Oral Tube size: 7.0 mm Number of attempts: 1 Airway Equipment and Method: Stylet Placement Confirmation: ETT inserted through vocal cords under direct vision,  positive ETCO2 and breath sounds checked- equal and bilateral Secured at: 22 cm Tube secured with: Tape Dental Injury: Teeth and Oropharynx as per pre-operative assessment

## 2016-12-02 NOTE — Transfer of Care (Signed)
Immediate Anesthesia Transfer of Care Note  Patient: Michelle Marks  Procedure(s) Performed: Procedure(s): DILATATION AND CURETTAGE /HYSTEROSCOPY (N/A) CERVICAL POLYPECTOMY (N/A)  Patient Location: PACU  Anesthesia Type:General  Level of Consciousness: awake, alert  and oriented  Airway & Oxygen Therapy: Patient Spontanous Breathing and Patient connected to face mask oxygen  Post-op Assessment: Report given to RN and Post -op Vital signs reviewed and stable  Post vital signs: Reviewed and stable  Last Vitals:  Vitals:   12/02/16 0859 12/02/16 1128  BP: 135/66 (!) 131/59  Pulse: 74 82  Resp: 20 14  Temp: 36.6 C 36.7 C    Last Pain:  Vitals:   12/02/16 0859  TempSrc: Oral         Complications: No apparent anesthesia complications

## 2016-12-02 NOTE — Anesthesia Post-op Follow-up Note (Cosign Needed)
Anesthesia QCDR form completed.        

## 2016-12-03 ENCOUNTER — Encounter: Payer: Self-pay | Admitting: Obstetrics and Gynecology

## 2016-12-03 LAB — SURGICAL PATHOLOGY

## 2016-12-10 ENCOUNTER — Encounter: Payer: Self-pay | Admitting: Obstetrics and Gynecology

## 2016-12-10 ENCOUNTER — Ambulatory Visit (INDEPENDENT_AMBULATORY_CARE_PROVIDER_SITE_OTHER): Payer: Medicare HMO | Admitting: Obstetrics and Gynecology

## 2016-12-10 VITALS — BP 132/80 | HR 90 | Ht 64.0 in | Wt 309.4 lb

## 2016-12-10 DIAGNOSIS — Z1231 Encounter for screening mammogram for malignant neoplasm of breast: Secondary | ICD-10-CM

## 2016-12-10 DIAGNOSIS — Z9889 Other specified postprocedural states: Secondary | ICD-10-CM

## 2016-12-10 DIAGNOSIS — Z4889 Encounter for other specified surgical aftercare: Secondary | ICD-10-CM

## 2016-12-10 DIAGNOSIS — Z1239 Encounter for other screening for malignant neoplasm of breast: Secondary | ICD-10-CM

## 2016-12-10 DIAGNOSIS — N840 Polyp of corpus uteri: Secondary | ICD-10-CM

## 2016-12-10 DIAGNOSIS — Z1211 Encounter for screening for malignant neoplasm of colon: Secondary | ICD-10-CM

## 2016-12-10 NOTE — Progress Notes (Signed)
    GYNECOLOGY PROGRESS NOTE  Subjective:    Patient ID: Michelle Marks, female    DOB: 01/30/1951, 66 y.o.   MRN: [id number]  HPI  Patient is a 66 y.o. 21P1001 female who presents for post-operative follow up.  She is 8 days s/p Hysteroscopy D&C with polypectomy, for endometrial polyps and postmenopausal bleeding.  Patient notes that she is still having some light spotting, but no heavy bleeding or pain.   The following portions of the patient's history were reviewed and updated as appropriate: allergies, current medications, past family history, past medical history, past social history, past surgical history and problem list.  Review of Systems Pertinent items noted in HPI and remainder of comprehensive ROS otherwise negative.   Objective:   Blood pressure 132/80, pulse 90, height 5\' 4"  (1.626 m), weight (!) 309 lb 6.4 oz (140.3 kg). General appearance: alert and no distress Abdomen: soft, non-tender; bowel sounds normal; no masses,  no organomegaly Pelvic: deferred   Pathology (12/02/16):  DIAGNOSIS:  A. ENDOMETRIAL CURETTINGS AND MULTIPLE POLYPS:  - ENDOMETRIAL POLYPS WITH FOCAL HYPERPLASIA; NEGATIVE FOR ATYPIA.  - INACTIVE BACKGROUND ENDOMETRIUM.  - ACUTELY INFLAMED ENDOCERVICAL GLANDULAR EPITHELIUM.  - NEGATIVE FOR MALIGNANCY.   B. ENDOMETRIAL POLYP:  - ENDOMETRIAL POLYP.  - NEGATIVE FOR ATYPIA AND MALIGNANCY.    Assessment:   Post-operative state, doing well s/p Hysteroscopy D&C with polypectomy Endometrial polyps PMB Breast cancer screening Colon cancer screening  Plan:    1. Continue any current medications. 2. Reviewed operative findings. Pathology report discussed.  3. Activity restrictions: none 4. Anticipated return to work: not applicable. 5. Patient notes that it has been ~ 2 years since her last mammogram, and has never had a colonoscopy. Routine screenings ordered.  6. Follow up: 10 months for annual exam, or as needed.     [name], Trea Latner,  MD [facility]

## 2016-12-25 ENCOUNTER — Telehealth: Payer: Self-pay | Admitting: Obstetrics and Gynecology

## 2016-12-25 NOTE — Telephone Encounter (Signed)
Patient is having PMB again - please call her she has a couple questions

## 2016-12-26 NOTE — Telephone Encounter (Signed)
Contacted patient, discussed her concerns of intermittent spotting, light vaginal bleeding after more strenuous activity (notes that this has happened twice since her D&C with polypectomy ~ 3 weeks ago).  Discussed that this can be normal for up to 4 weeks post-op, however since she did have benign atrophic tissue, she may be experiencing atrophic endometritis.  If the bleeding continues, she would likely benefit from short term therapy with progesterone (2 weeks of therapy x 3 months).  Patient notes understanding.  If bleeding continues she will call back for prescription.

## 2018-01-05 ENCOUNTER — Other Ambulatory Visit: Payer: Self-pay | Admitting: Family Medicine

## 2018-01-05 DIAGNOSIS — Z1231 Encounter for screening mammogram for malignant neoplasm of breast: Secondary | ICD-10-CM

## 2018-02-05 ENCOUNTER — Ambulatory Visit
Admission: RE | Admit: 2018-02-05 | Discharge: 2018-02-05 | Disposition: A | Discharge status: Home / Self Care | Payer: Medicare HMO | Source: Ambulatory Visit | Attending: Family Medicine | Admitting: Family Medicine

## 2018-02-05 DIAGNOSIS — Z1231 Encounter for screening mammogram for malignant neoplasm of breast: Secondary | ICD-10-CM | POA: Diagnosis not present

## 2018-02-10 ENCOUNTER — Other Ambulatory Visit: Payer: Self-pay | Admitting: Family Medicine

## 2018-02-10 DIAGNOSIS — N6489 Other specified disorders of breast: Secondary | ICD-10-CM

## 2018-02-10 DIAGNOSIS — R928 Other abnormal and inconclusive findings on diagnostic imaging of breast: Secondary | ICD-10-CM

## 2018-02-18 ENCOUNTER — Ambulatory Visit
Admission: RE | Admit: 2018-02-18 | Discharge: 2018-02-18 | Disposition: A | Discharge status: Home / Self Care | Payer: Medicare HMO | Source: Ambulatory Visit | Attending: Family Medicine | Admitting: Family Medicine

## 2018-02-18 DIAGNOSIS — N6489 Other specified disorders of breast: Secondary | ICD-10-CM | POA: Diagnosis present

## 2018-02-18 DIAGNOSIS — R928 Other abnormal and inconclusive findings on diagnostic imaging of breast: Secondary | ICD-10-CM

## 2018-02-19 ENCOUNTER — Other Ambulatory Visit: Payer: Self-pay | Admitting: Family Medicine

## 2018-02-19 DIAGNOSIS — N6489 Other specified disorders of breast: Secondary | ICD-10-CM

## 2018-02-19 DIAGNOSIS — R928 Other abnormal and inconclusive findings on diagnostic imaging of breast: Secondary | ICD-10-CM

## 2018-03-03 ENCOUNTER — Ambulatory Visit
Admission: RE | Admit: 2018-03-03 | Discharge: 2018-03-03 | Disposition: A | Discharge status: Home / Self Care | Payer: Medicare HMO | Source: Ambulatory Visit | Attending: Family Medicine | Admitting: Family Medicine

## 2018-03-03 DIAGNOSIS — N6489 Other specified disorders of breast: Secondary | ICD-10-CM

## 2018-03-03 DIAGNOSIS — R928 Other abnormal and inconclusive findings on diagnostic imaging of breast: Secondary | ICD-10-CM

## 2018-08-18 ENCOUNTER — Other Ambulatory Visit: Payer: Self-pay | Admitting: Family Medicine

## 2018-08-18 DIAGNOSIS — N6489 Other specified disorders of breast: Secondary | ICD-10-CM

## 2018-09-02 ENCOUNTER — Other Ambulatory Visit: Payer: Medicare HMO

## 2018-10-19 ENCOUNTER — Ambulatory Visit: Payer: Medicare HMO

## 2018-10-19 ENCOUNTER — Other Ambulatory Visit: Payer: Medicare HMO

## 2018-11-18 ENCOUNTER — Other Ambulatory Visit: Payer: Medicare HMO

## 2019-01-11 ENCOUNTER — Ambulatory Visit
Admission: RE | Admit: 2019-01-11 | Discharge: 2019-01-11 | Disposition: A | Discharge status: Home / Self Care | Payer: Medicare HMO | Source: Ambulatory Visit | Attending: Family Medicine | Admitting: Family Medicine

## 2019-01-11 ENCOUNTER — Other Ambulatory Visit: Payer: Self-pay | Admitting: Family Medicine

## 2019-01-11 DIAGNOSIS — N6489 Other specified disorders of breast: Secondary | ICD-10-CM

## 2019-09-19 IMAGING — MG MM DIGITAL SCREENING BILAT W/ TOMO W/ CAD
8 series · 8 of 24 positions shown · non-contrast
Comparison: Previous exam(s).

CLINICAL DATA: Screening.

EXAM:
DIGITAL SCREENING BILATERAL MAMMOGRAM WITH TOMO AND CAD

[L CC synth-2D]
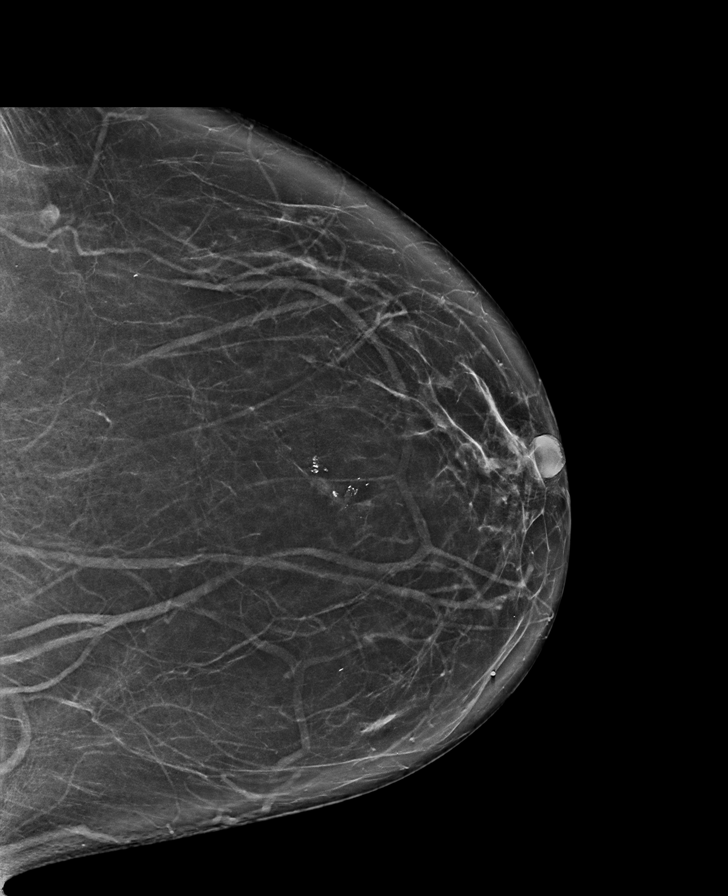

[R CC synth-2D]
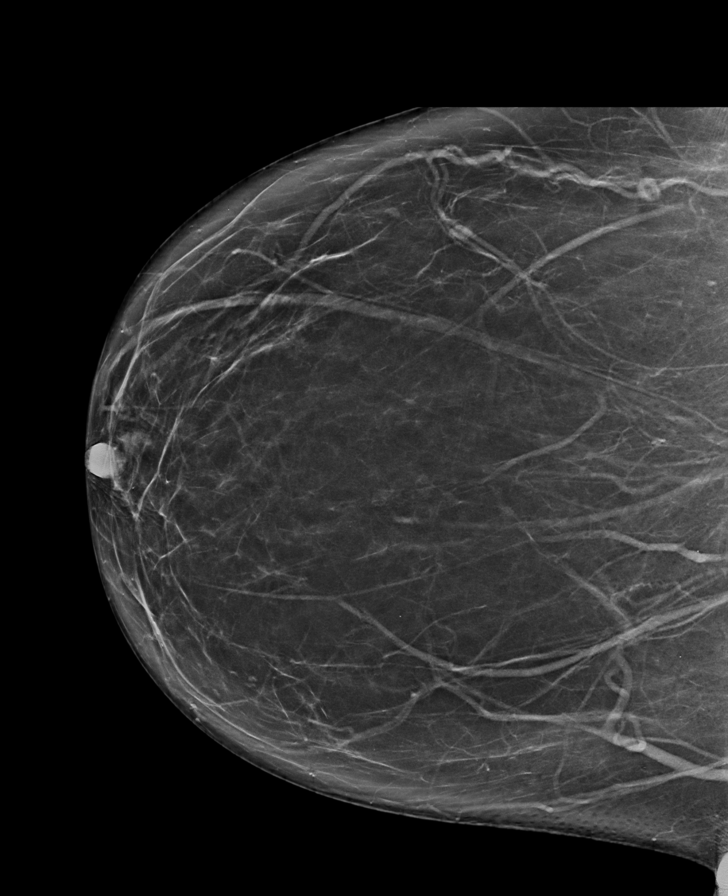

[L MLO synth-2D]
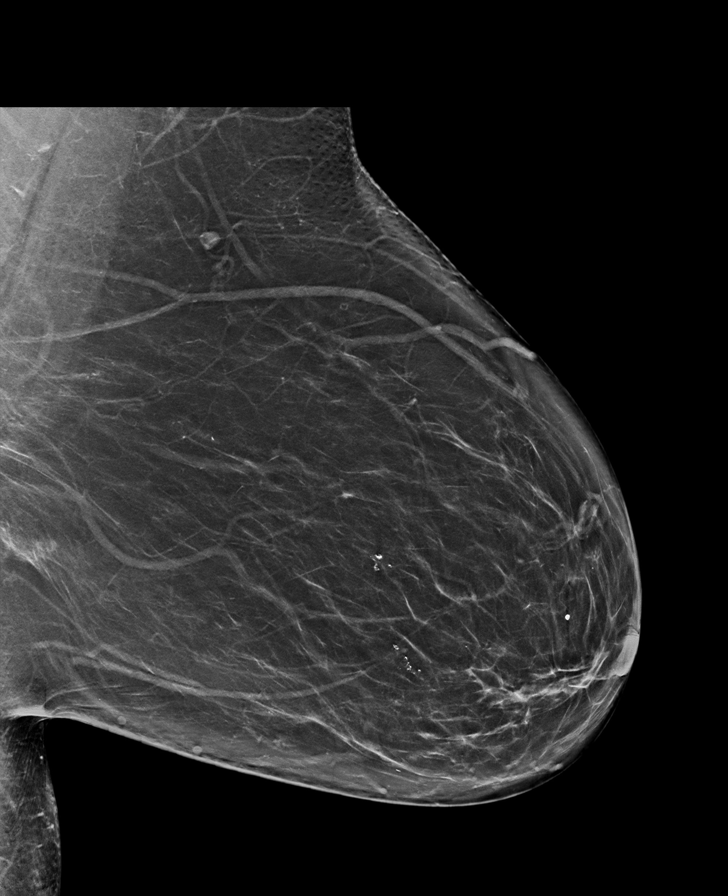

[R MLO synth-2D]
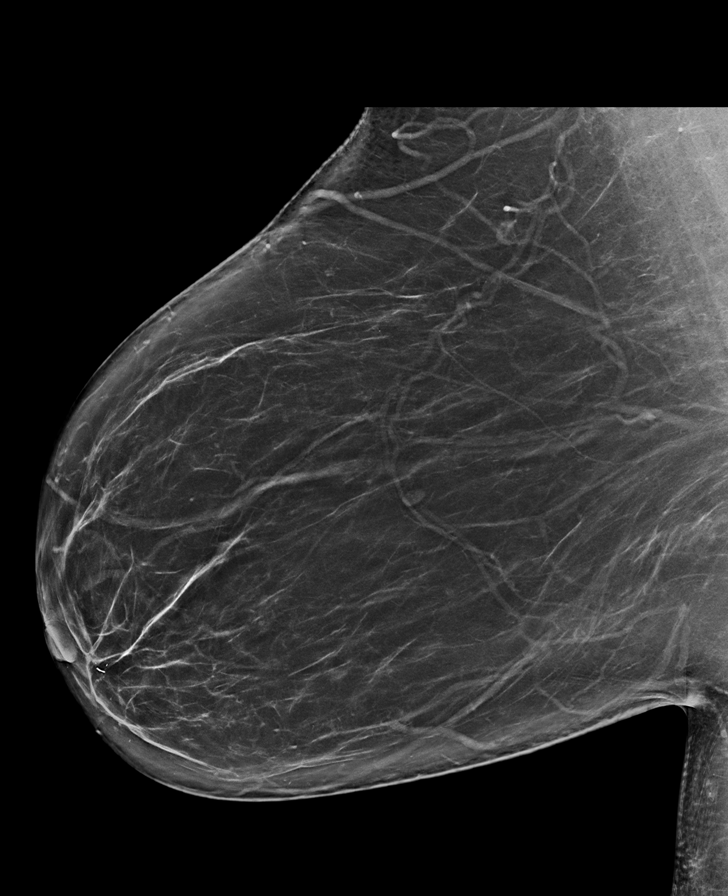

[R CC tomo · tomo slice 39/77.0]
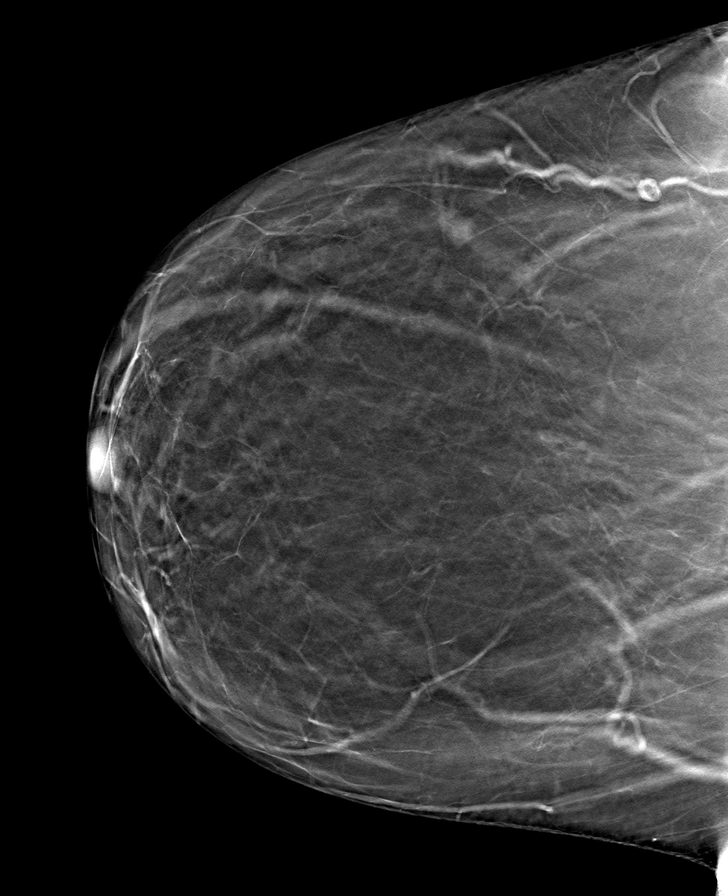

[R MLO tomo · tomo slice 47/93.0]
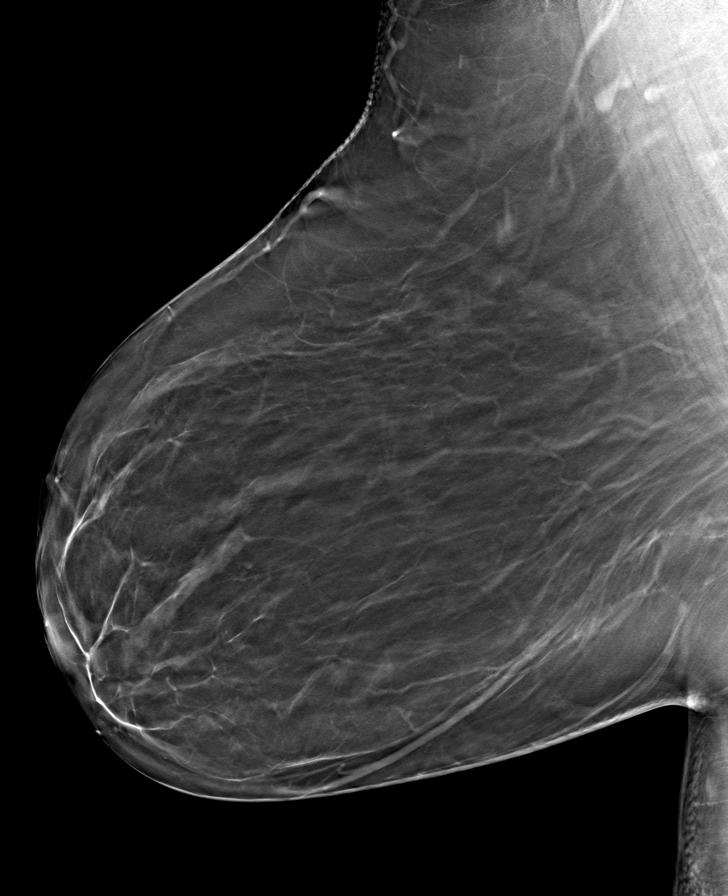

[L MLO tomo · tomo slice 45/90.0]
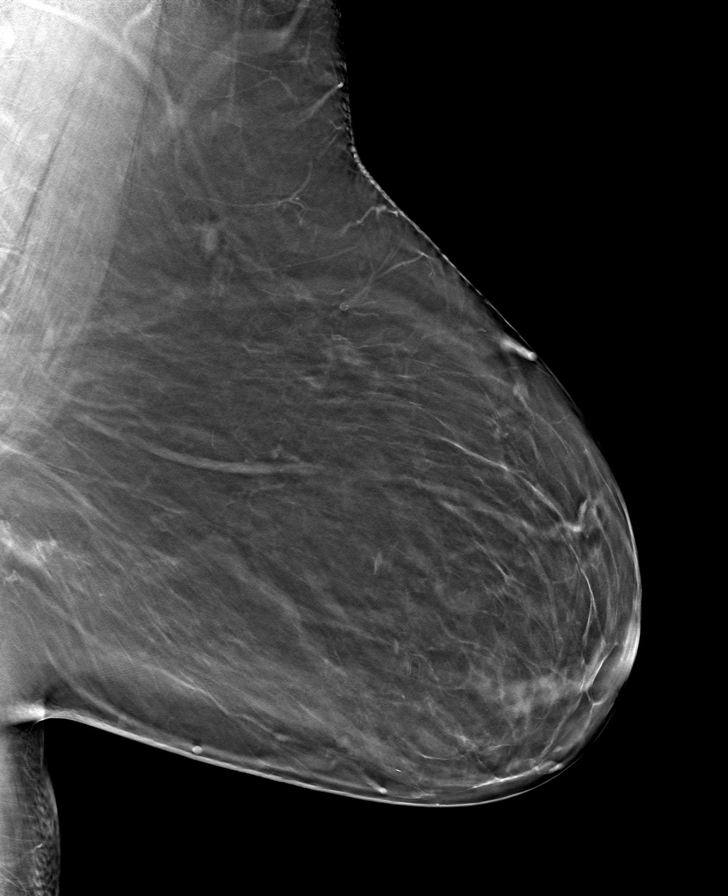

[L CC tomo · tomo slice 39/77.0]
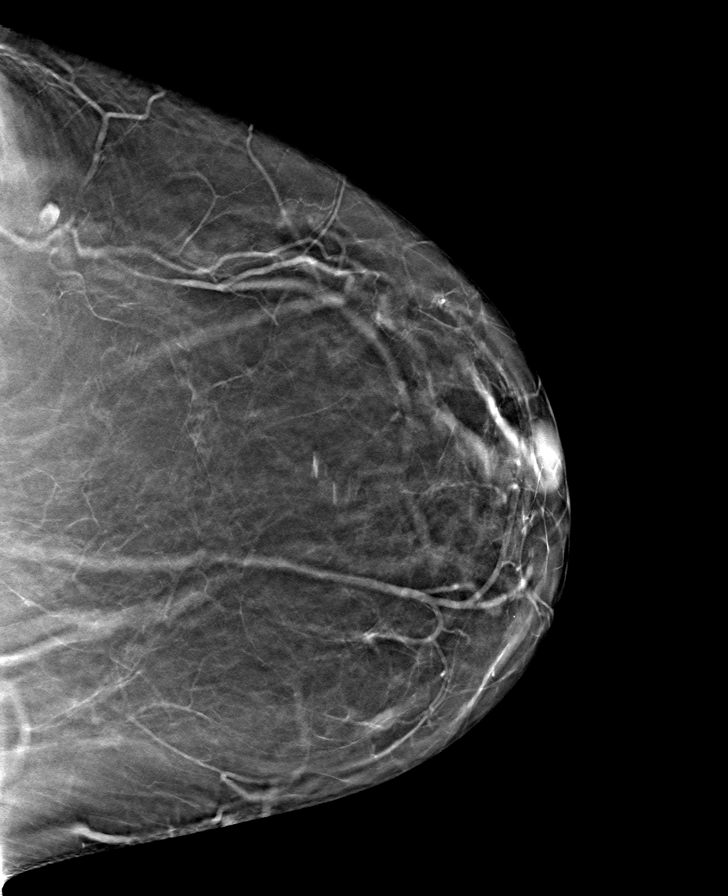

[8 of 24 positions shown; findings below may reference images not displayed]

ACR Breast Density Category b: There are scattered areas of
fibroglandular density.
FINDINGS: In the left breast, a possible asymmetry warrants further
evaluation. In the right breast, no findings suspicious for
malignancy. Images were processed with CAD.
IMPRESSION: Further evaluation is suggested for possible asymmetry in the left
breast.

RECOMMENDATION:
Diagnostic mammogram and possibly ultrasound of the left breast.
(Code:DI-5-LL4)

The patient will be contacted regarding the findings, and additional
imaging will be scheduled.

BI-RADS CATEGORY  0: Incomplete. Need additional imaging evaluation
and/or prior mammograms for comparison.

## 2020-04-11 ENCOUNTER — Other Ambulatory Visit: Payer: Self-pay | Admitting: Family Medicine

## 2020-04-11 DIAGNOSIS — N6489 Other specified disorders of breast: Secondary | ICD-10-CM

## 2020-05-04 ENCOUNTER — Other Ambulatory Visit: Payer: Medicare HMO

## 2020-05-19 ENCOUNTER — Other Ambulatory Visit: Payer: Medicare HMO

## 2020-06-23 ENCOUNTER — Other Ambulatory Visit: Payer: Medicare HMO

## 2020-07-10 ENCOUNTER — Ambulatory Visit
Admission: RE | Admit: 2020-07-10 | Discharge: 2020-07-10 | Disposition: A | Discharge status: Home / Self Care | Payer: Medicare HMO | Source: Ambulatory Visit | Attending: Family Medicine | Admitting: Family Medicine

## 2020-07-10 ENCOUNTER — Other Ambulatory Visit: Payer: Self-pay

## 2020-07-10 DIAGNOSIS — N6489 Other specified disorders of breast: Secondary | ICD-10-CM

## 2020-08-21 DIAGNOSIS — M1A0710 Idiopathic chronic gout, right ankle and foot, without tophus (tophi): Secondary | ICD-10-CM | POA: Insufficient documentation

## 2020-08-21 DIAGNOSIS — R768 Other specified abnormal immunological findings in serum: Secondary | ICD-10-CM | POA: Insufficient documentation

## 2020-08-21 DIAGNOSIS — E663 Overweight: Secondary | ICD-10-CM | POA: Insufficient documentation

## 2020-08-21 DIAGNOSIS — L409 Psoriasis, unspecified: Secondary | ICD-10-CM | POA: Insufficient documentation

## 2020-08-24 IMAGING — MG DIGITAL DIAGNOSTIC BILATERAL MAMMOGRAM WITH TOMO AND CAD
4 series · 4 of 12 positions shown · non-contrast
Comparison: Previous exam(s).

CLINICAL DATA: 67-year-old female presenting for follow-up of a
probably benign asymmetry in the left breast.

EXAM:
DIGITAL DIAGNOSTIC BILATERAL MAMMOGRAM WITH CAD AND TOMO

[L MLO synth-2D]
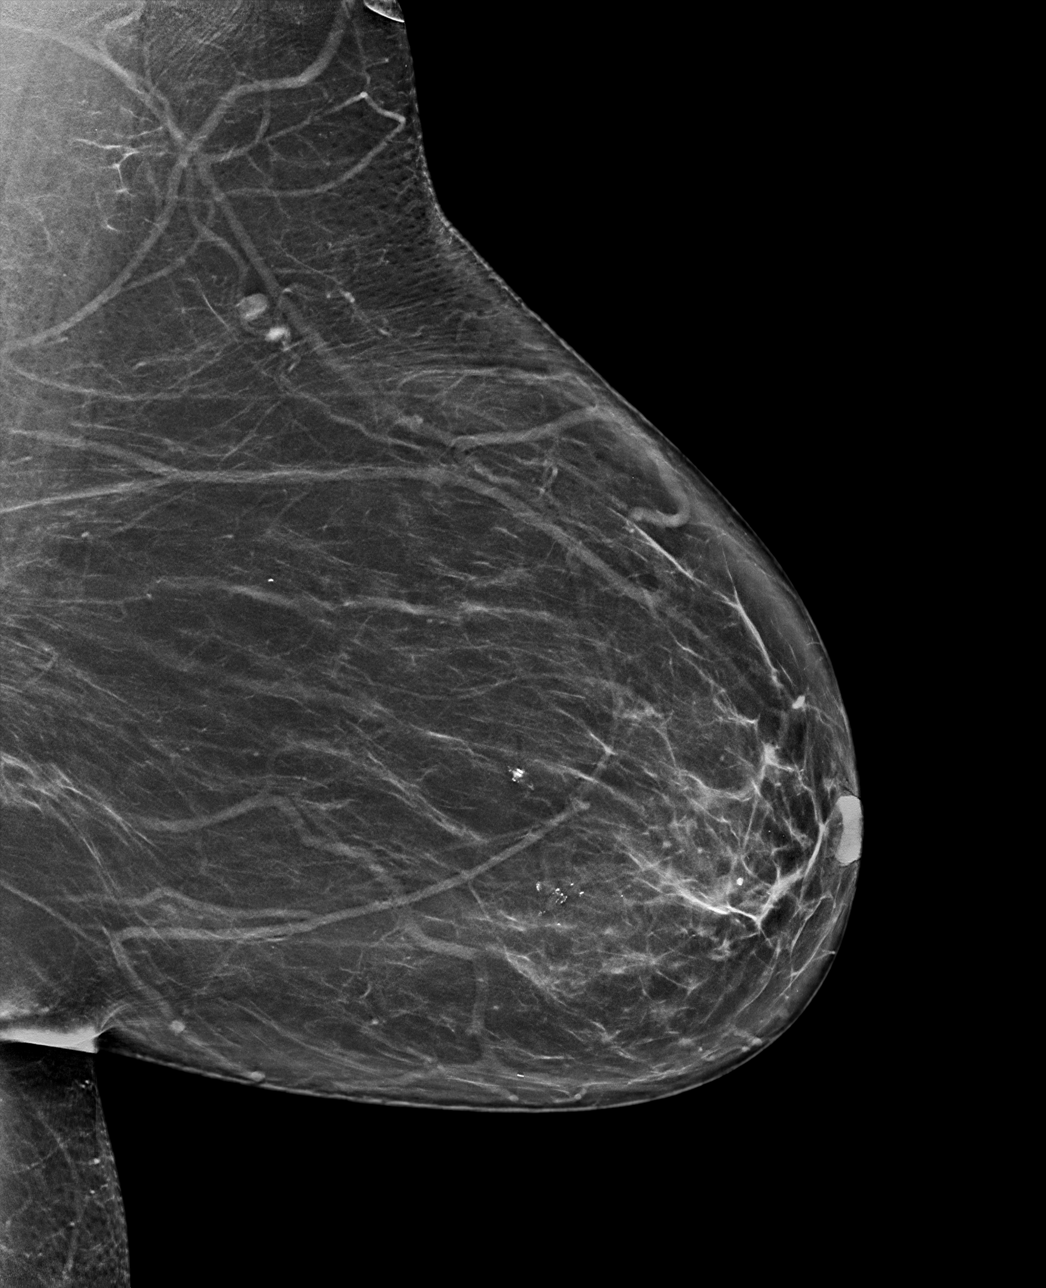

[L CC synth-2D]
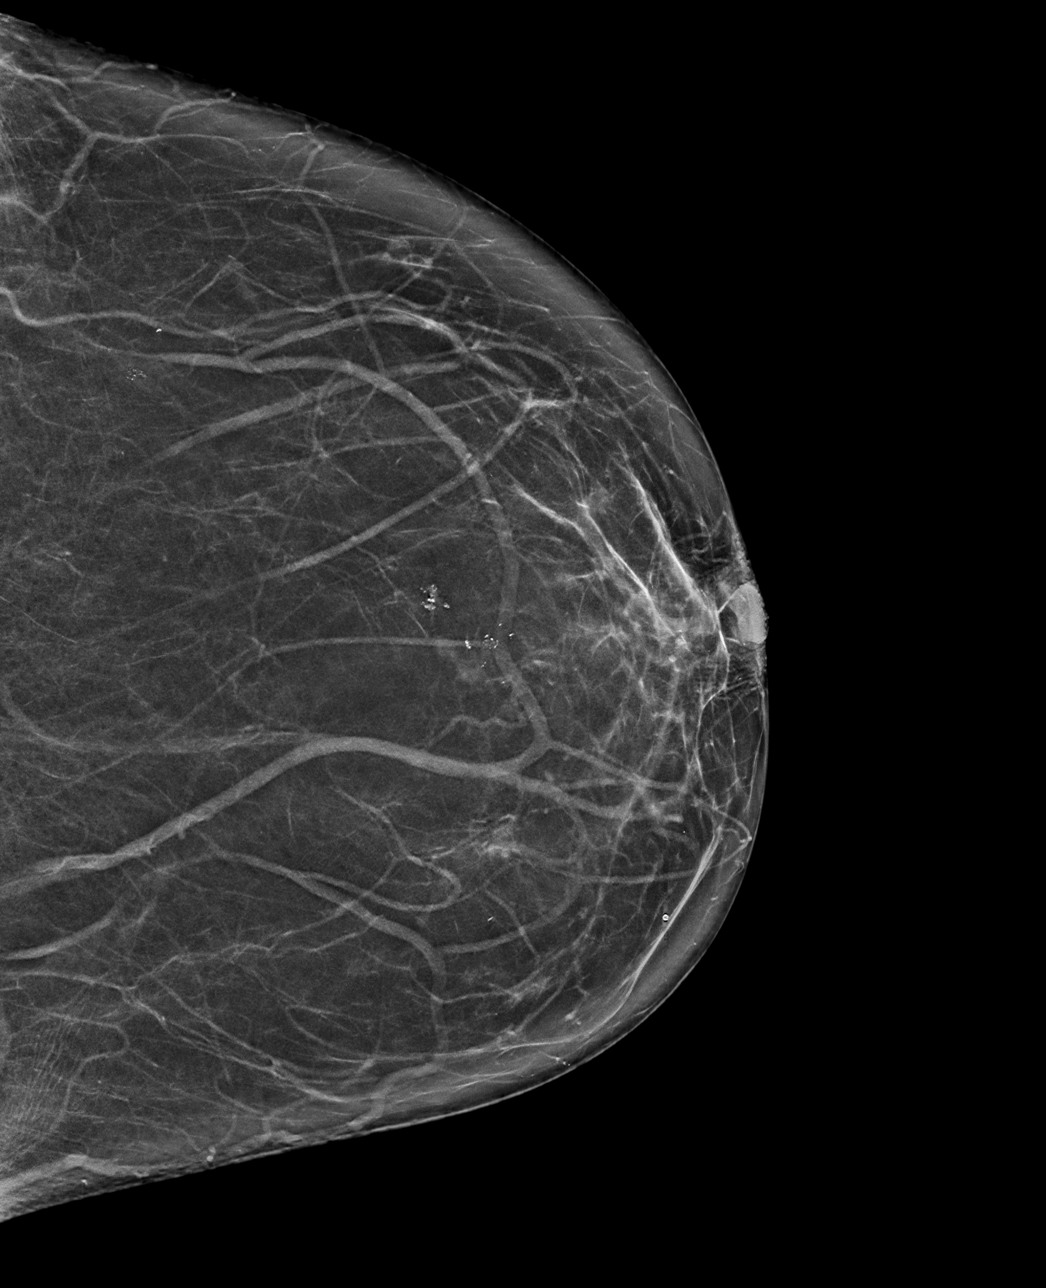

[L CC tomo · tomo slice 35/68.0]
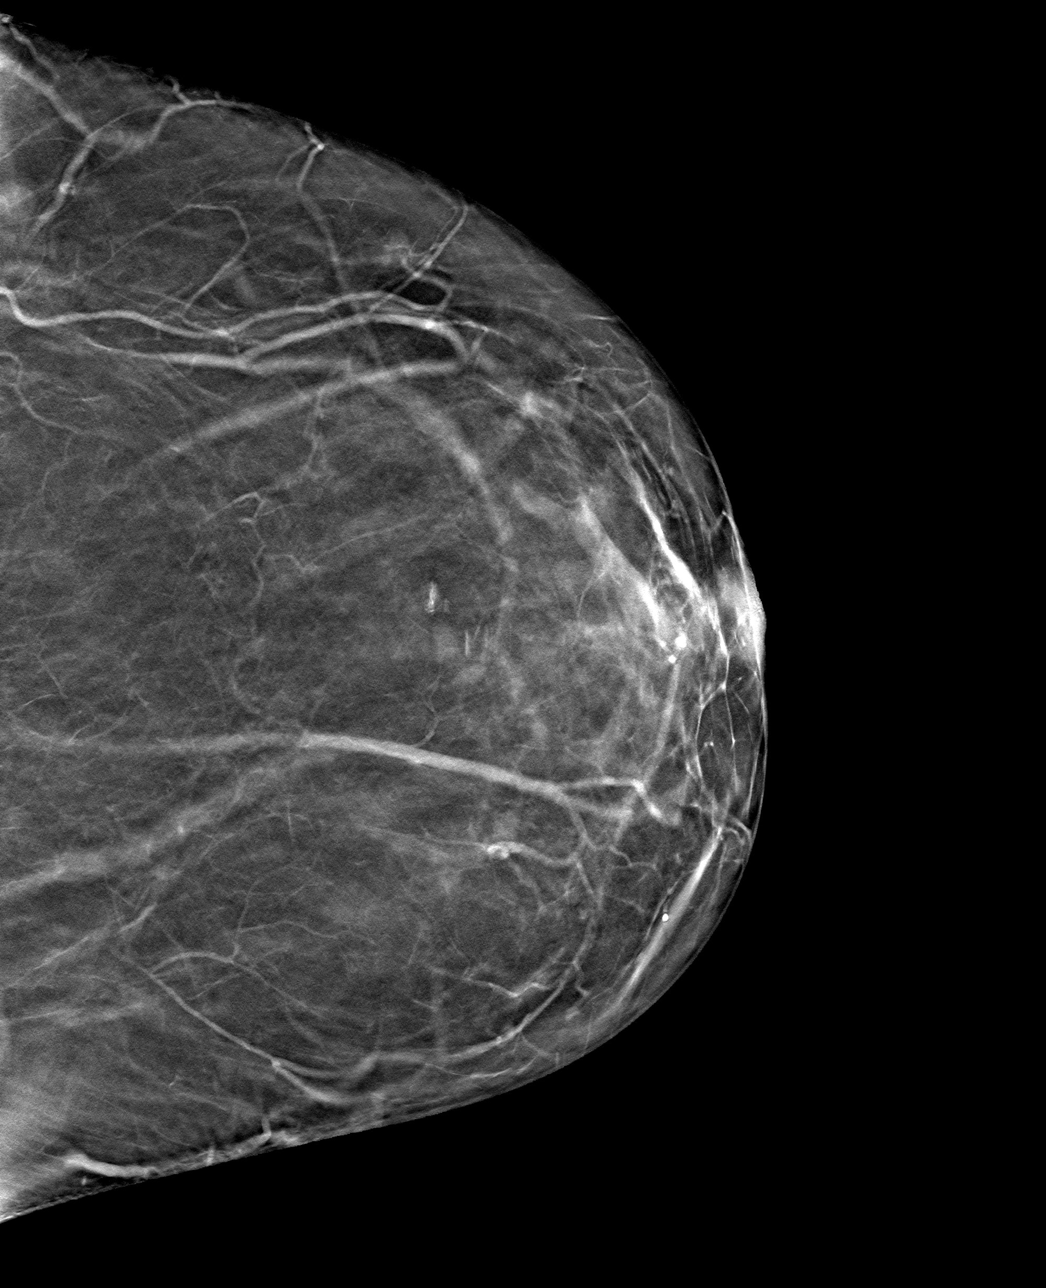

[L MLO tomo · tomo slice 43/85.0]
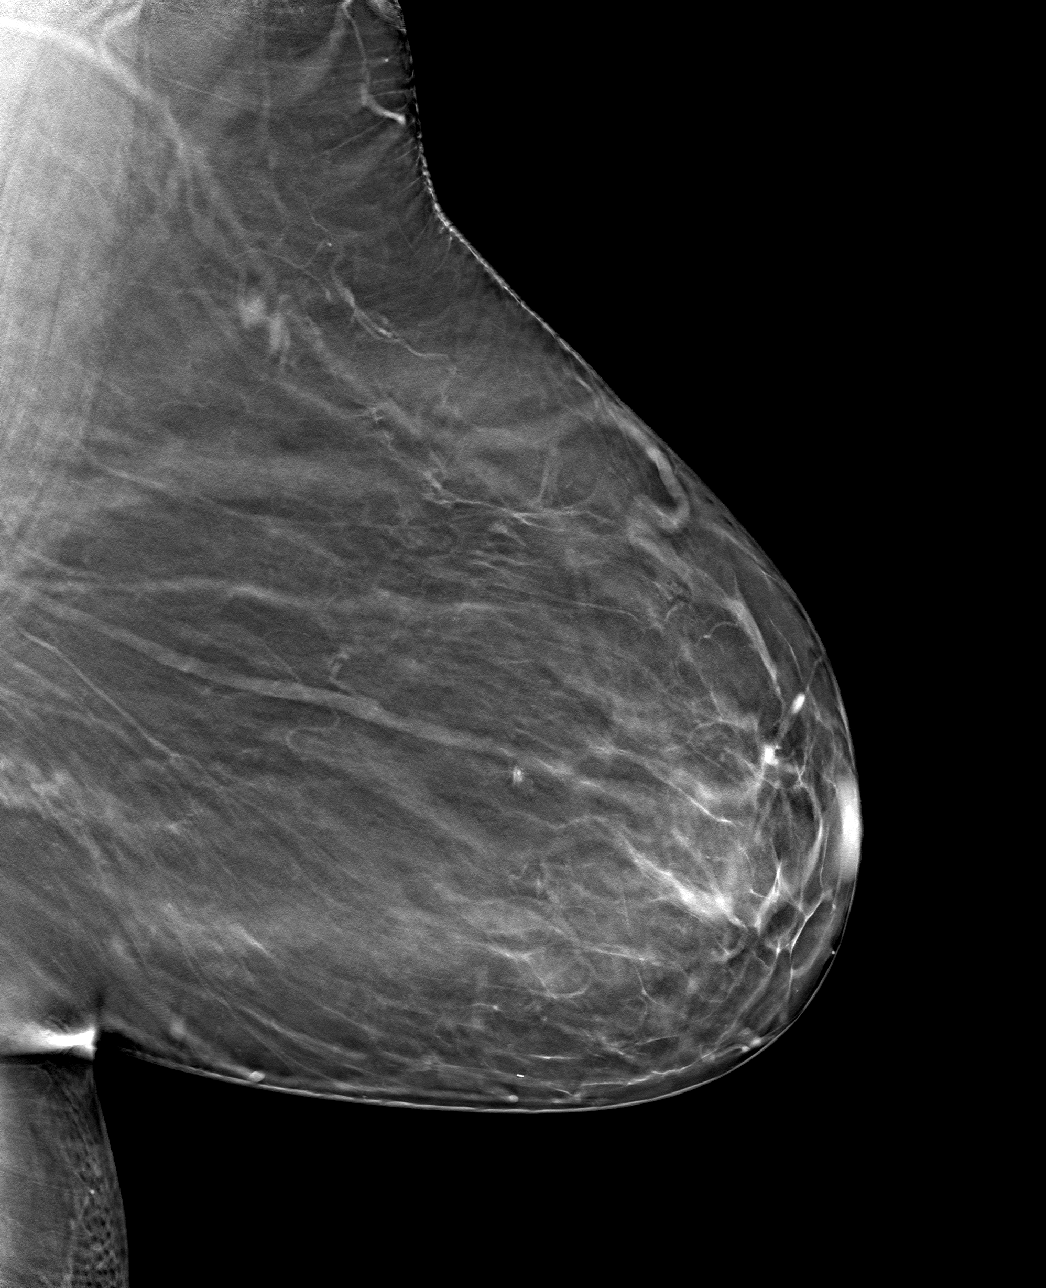

[4 of 12 positions shown; findings below may reference images not displayed]

ACR Breast Density Category b: There are scattered areas of
fibroglandular density.
FINDINGS: The asymmetry in the posterior left breast is mammographically
stable. No suspicious calcifications, masses or areas of distortion
are seen in the bilateral breasts.

Mammographic images were processed with CAD.
IMPRESSION: 1.  The left breast asymmetry is stable.

2.  No mammographic evidence of malignancy in the bilateral breasts.

RECOMMENDATION:
1.  Diagnostic mammogram is suggested in 1 year. (Code:[id number])

I have discussed the findings and recommendations with the patient.
Results were also provided in writing at the conclusion of the
visit. If applicable, a reminder letter will be sent to the patient
regarding the next appointment.

BI-RADS CATEGORY  3: Probably benign.

## 2021-01-12 ENCOUNTER — Ambulatory Visit: Payer: Self-pay | Admitting: *Deleted

## 2021-01-12 NOTE — Telephone Encounter (Signed)
Summary: stomach discomfort   Patient shares that they have been experiencing indigestion and discomfort   The patient shares they have a history of acid reflux   The patient has experienced discomfort since Tuesday 01/09/21      Reason for Disposition  Abdominal pains regularly occur about 1 hour after meals  Answer Assessment - Initial Assessment Questions 1. LOCATION: "Where does it hurt?"      Nausea, hot flushes,reflux discomfort 2. RADIATION: "Does the pain shoot anywhere else?" (e.g., chest, back)     No pain 3. ONSET: "When did the pain begin?" (e.g., minutes, hours or days ago)      Just this week- Mylanta seems to help 4. SUDDEN: "Gradual or sudden onset?"     sudden 5. PATTERN "Does the pain come and go, or is it constant?"    - If constant: "Is it getting better, staying the same, or worsening?"      (Note: Constant means the pain never goes away completely; most serious pain is constant and it progresses)     - If intermittent: "How long does it last?" "Do you have pain now?"     (Note: Intermittent means the pain goes away completely between bouts)     Comes and goes- patient thinks her medication has stopped working- pantoprazole  6. SEVERITY: "How bad is the pain?"  (e.g., Scale 1-10; mild, moderate, or severe)    - MILD (1-3): doesn't interfere with normal activities, abdomen soft and not tender to touch     - MODERATE (4-7): interferes with normal activities or awakens from sleep, abdomen tender to touch     - SEVERE (8-10): excruciating pain, doubled over, unable to do any normal activities       Mild/moderate 7. RECURRENT SYMPTOM: "Have you ever had this type of stomach pain before?" If Yes, ask: "When was the last time?" and "What happened that time?"      Yes- Reflux 8. AGGRAVATING FACTORS: "Does anything seem to cause this pain?" (e.g., foods, stress, alcohol)     Not sure 9. CARDIAC SYMPTOMS: "Do you have any of the following symptoms: chest pain, difficulty  breathing, sweating, nausea?"     Nausea, flushing 10. OTHER SYMPTOMS: "Do you have any other symptoms?" (e.g., back pain, diarrhea, fever, urination pain, vomiting)       burping 11. PREGNANCY: "Is there any chance you are pregnant?" "When was your last menstrual period?"       N/a  Protocols used: Abdominal Pain - Upper-A-AH

## 2021-01-15 NOTE — Telephone Encounter (Signed)
Routing to provider to advise. Would it be ok to move the patient's new patient appointment sooner if possible?

## 2021-01-15 NOTE — Telephone Encounter (Signed)
If patient is OK seeing another provider, I think it would be fine to move her up as able

## 2021-01-16 NOTE — Telephone Encounter (Signed)
Appointment scheduled.

## 2021-01-19 ENCOUNTER — Ambulatory Visit: Payer: Medicare HMO | Admitting: Internal Medicine

## 2021-02-09 ENCOUNTER — Encounter: Payer: Self-pay | Admitting: Nurse Practitioner

## 2021-02-09 DIAGNOSIS — E559 Vitamin D deficiency, unspecified: Secondary | ICD-10-CM | POA: Insufficient documentation

## 2021-02-09 DIAGNOSIS — E782 Mixed hyperlipidemia: Secondary | ICD-10-CM | POA: Insufficient documentation

## 2021-02-09 DIAGNOSIS — E669 Obesity, unspecified: Secondary | ICD-10-CM | POA: Insufficient documentation

## 2021-02-09 DIAGNOSIS — E039 Hypothyroidism, unspecified: Secondary | ICD-10-CM | POA: Insufficient documentation

## 2021-02-09 DIAGNOSIS — E785 Hyperlipidemia, unspecified: Secondary | ICD-10-CM | POA: Insufficient documentation

## 2021-02-09 DIAGNOSIS — E1169 Type 2 diabetes mellitus with other specified complication: Secondary | ICD-10-CM | POA: Insufficient documentation

## 2021-02-09 DIAGNOSIS — K219 Gastro-esophageal reflux disease without esophagitis: Secondary | ICD-10-CM | POA: Insufficient documentation

## 2021-02-12 ENCOUNTER — Ambulatory Visit (INDEPENDENT_AMBULATORY_CARE_PROVIDER_SITE_OTHER): Payer: Medicare HMO | Admitting: Nurse Practitioner

## 2021-02-12 ENCOUNTER — Other Ambulatory Visit: Payer: Self-pay

## 2021-02-12 ENCOUNTER — Encounter: Payer: Self-pay | Admitting: Nurse Practitioner

## 2021-02-12 VITALS — BP 135/71 | HR 80 | Temp 98.4°F | Ht 63.0 in | Wt 292.6 lb

## 2021-02-12 DIAGNOSIS — E1169 Type 2 diabetes mellitus with other specified complication: Secondary | ICD-10-CM | POA: Insufficient documentation

## 2021-02-12 DIAGNOSIS — I890 Lymphedema, not elsewhere classified: Secondary | ICD-10-CM

## 2021-02-12 DIAGNOSIS — Z7689 Persons encountering health services in other specified circumstances: Secondary | ICD-10-CM | POA: Diagnosis not present

## 2021-02-12 DIAGNOSIS — R030 Elevated blood-pressure reading, without diagnosis of hypertension: Secondary | ICD-10-CM | POA: Insufficient documentation

## 2021-02-12 DIAGNOSIS — E039 Hypothyroidism, unspecified: Secondary | ICD-10-CM

## 2021-02-12 DIAGNOSIS — I10 Essential (primary) hypertension: Secondary | ICD-10-CM

## 2021-02-12 DIAGNOSIS — E559 Vitamin D deficiency, unspecified: Secondary | ICD-10-CM

## 2021-02-12 DIAGNOSIS — E782 Mixed hyperlipidemia: Secondary | ICD-10-CM | POA: Diagnosis not present

## 2021-02-12 DIAGNOSIS — E669 Obesity, unspecified: Secondary | ICD-10-CM | POA: Insufficient documentation

## 2021-02-12 DIAGNOSIS — R7309 Other abnormal glucose: Secondary | ICD-10-CM

## 2021-02-12 DIAGNOSIS — K219 Gastro-esophageal reflux disease without esophagitis: Secondary | ICD-10-CM

## 2021-02-12 DIAGNOSIS — R768 Other specified abnormal immunological findings in serum: Secondary | ICD-10-CM

## 2021-02-12 DIAGNOSIS — L409 Psoriasis, unspecified: Secondary | ICD-10-CM

## 2021-02-12 DIAGNOSIS — M1A0710 Idiopathic chronic gout, right ankle and foot, without tophus (tophi): Secondary | ICD-10-CM

## 2021-02-12 MED ORDER — ESOMEPRAZOLE MAGNESIUM 40 MG PO CPDR: 40.0000 mg | DELAYED_RELEASE_CAPSULE | Freq: Every day | ORAL | 4 refills | Status: DC

## 2021-02-12 MED ORDER — LEVOTHYROXINE SODIUM 75 MCG PO TABS: 75.0000 ug | ORAL_TABLET | Freq: Every day | ORAL | 4 refills | Status: DC

## 2021-02-12 MED ORDER — ERGOCALCIFEROL 1.25 MG (50000 UT) PO CAPS
50000.0000 [IU] | ORAL_CAPSULE | ORAL | 4 refills | Status: DC
Start: 2021-02-12 — End: 2022-05-01

## 2021-02-12 MED ORDER — ACCU-CHEK SOFTCLIX LANCETS MISC: 12 refills | Status: DC

## 2021-02-12 MED ORDER — TORSEMIDE 100 MG PO TABS: 100.0000 mg | ORAL_TABLET | Freq: Every day | ORAL | 4 refills | Status: DC

## 2021-02-12 MED ORDER — GLUCOSE BLOOD VI STRP: ORAL_STRIP | 12 refills | Status: DC

## 2021-02-12 MED ORDER — ATORVASTATIN CALCIUM 10 MG PO TABS: 10.0000 mg | ORAL_TABLET | Freq: Every day | ORAL | 4 refills | Status: DC

## 2021-02-12 NOTE — Assessment & Plan Note (Addendum)
Chronic, ongoing.  At this time continue Nexium as recently started this and will adjust as needed.  She does have a hiatal hernia, discussed with her.  May benefit from referral to GI for further assessment due to ongoing GERD issues with poor response to PPI -- may benefit from EGD.  Pt wishes to hold off at this time.  Mag level today.

## 2021-02-12 NOTE — Assessment & Plan Note (Signed)
Chronic, ongoing.  Continue weekly Vitamin D, refills sent in.  Check Vit D level today and assess if DEXA has been performed, did not see on past records.

## 2021-02-12 NOTE — Assessment & Plan Note (Signed)
Chronic, ongoing.  Continue current medication regimen and check TSH, Free T4, and thyroid antibody today.  Adjust regimen as needed.

## 2021-02-12 NOTE — Patient Instructions (Signed)

## 2021-02-12 NOTE — Assessment & Plan Note (Signed)
Chronic, stable, no current flares.  Monitor and treat as needed. ?

## 2021-02-12 NOTE — Assessment & Plan Note (Signed)
At this time continue collaboration with rheumatology.

## 2021-02-12 NOTE — Assessment & Plan Note (Signed)
Chronic, ongoing.  At this time continue Torsemide as ordered by previous provider, although limited evidence for this being beneficial in lymphedema.  May benefit referral to vascular in future and assist on obtaining leg pumps, also may benefit from massage therapy.

## 2021-02-12 NOTE — Progress Notes (Signed)
New Patient Office Visit  Subjective:  Patient ID: Michelle Marks, female    DOB: 09-23-1950  Age: 70 y.o. MRN: [id number]  CC:  Chief Complaint  Patient presents with   Establish Care    Patient is here to establish care. Patient states she was treated unfair at the last practice. Patient states she is happy that we could see her and help make her life easier.     HPI Michelle Marks presents for new patient visit to establish care.  Introduced to Publishing rights managernurse practitioner role and practice setting.  All questions answered.  Discussed provider/patient relationship and expectations. Left last practice due to feeling she was treated unfairly --  Preferred Primary Care in [place].  On review of past notes noted T2DM diagnosis, she reports that she does not have diabetes -- but A1c has been 6% the past 10 years.  Fasting sugars are often 110-120 she reports.  She would like refill on AccuChek supplies.  GOUT History of elevation in Rf on review past labs and saw rheumatology on 08/21/20 = no signs of RA.  They offered to start her on Allopurinol for elevation in uric acid levels, but she declined at that time, continues to decline today.  She reports no pain since March.  Has psoriasis, has had since in her 6420's.  Does not want to take medication for this.  Goes to dermatology, Dr. Roseanne KaufmanIsenstein.   Duration:chronic Right 1st metatarsophalangeal pain: no Left 1st metatarsophalangeal pain: yes in past Right knee pain: no Left knee pain: no Swelling: no Redness: no Trauma: no Recent dietary change or indiscretion: no Fevers: no Nausea/vomiting: no Aggravating factors nothing Alleviating factors: nothing Status:  better Treatments attempted: nothing  HYPOTHYROIDISM Continues on Levothyroxine 75 MCG daily.   Thyroid control status:controlled Satisfied with current treatment? yes Medication side effects: no Medication compliance: good compliance Etiology of hypothyroidism:  Recent dose  adjustment:no Fatigue: no Cold intolerance: no Heat intolerance: no Weight gain: no Weight loss: no Constipation: no Diarrhea/loose stools: no Palpitations: no Lower extremity edema: no Anxiety/depressed mood: no  HYPERTENSION / HYPERLIPIDEMIA Continues on Torsemide for lymphedema and BP and Atorvastatin. Satisfied with current treatment? yes Duration of hypertension: chronic BP monitoring frequency: not  checking BP range:  BP medication side effects: no Duration of hyperlipidemia: chronic Cholesterol medication side effects: no Cholesterol supplements: none Medication compliance: good compliance Aspirin: no Recent stressors: no Recurrent headaches: no Visual changes: no Palpitations: no Dyspnea: no Chest pain: no Lower extremity edema: yes Dizzy/lightheaded: no    GERD Previously was on Protonix, but recently stopped working.  Was seen in urgent care on 01/17/21 for this and changed to Nexium 40 MG daily.  She reports feeling a bit better with Nexium.  Has a hiatal hernia. GERD control status: exacerbated Satisfied with current treatment? yes Heartburn frequency: varies Medication side effects: no  Medication compliance: fluctuating Previous GERD medications: Protonix Antacid use frequency:  Mylanta if needed Nature: nausea Location: epigastric Heartburn duration: 30 minutes Alleviatiating factors:  nothing Aggravating factors: foods Dysphagia: no Odynophagia:  no Hematemesis: no Blood in stool: no EGD: no   Past Medical History:  Diagnosis Date   Anxiety    Arthritis    Difficult intubation    Edema, leg    Endometrial polyp    s/p Hysteroscopy D&C with polypectomy in 11/2016   GERD (gastroesophageal reflux disease)    Hyperlipidemia    Morbid obesity (HCC) 10/24/2016    Past Surgical History:  Procedure  Laterality Date   BREAST CYST EXCISION Right age 85   benign   BREAST SURGERY Right    Cyst Removal   CERVICAL POLYPECTOMY N/A 12/02/2016    Procedure: CERVICAL POLYPECTOMY;  Surgeon: Hildred Laser, MD;  Location: [facility] OR[facility];  Service: Gynecology;  Laterality: N/A;   DILATION AND CURETTAGE OF UTERUS     HYSTEROSCOPY     HYSTEROSCOPY WITH D & C N/A 12/02/2016   Procedure: DILATATION AND CURETTAGE /HYSTEROSCOPY;  Surgeon: Hildred Laser, MD;  Location: [facility] OR[facility];  Service: Gynecology;  Laterality: N/A;   JOINT REPLACEMENT Left    Total Knee Replacement, Dr. Ernest Pine, [facility]   KNEE ARTHROSCOPY Left    TONSILLECTOMY     as a child    Family History  Problem Relation Age of Onset   Cancer Mother    Diabetes Mother    Hypertension Mother    Diabetes Father    Pancreatitis Father    Cancer Sister     Social History   Socioeconomic History   Marital status: Single    Spouse name: Not on file   Number of children: Not on file   Years of education: Not on file   Highest education level: Not on file  Occupational History   Not on file  Tobacco Use   Smoking status: Former    Types: Cigarettes    Quit date: 09/16/2003    Years since quitting: 17.4   Smokeless tobacco: Never  Vaping Use   Vaping Use: Never used  Substance and Sexual Activity   Alcohol use: Yes    Comment: occas   Drug use: No   Sexual activity: Not Currently    Birth control/protection: None  Other Topics Concern   Not on file  Social History Narrative   Not on file   Social Determinants of Health   Financial Resource Strain: Low Risk    Difficulty of Paying Living Expenses: Not hard at all  Food Insecurity: No Food Insecurity   Worried About Programme researcher, broadcasting/film/video in the Last Year: Never true   Ran Out of Food in the Last Year: Never true  Transportation Needs: No Transportation Needs   Lack of Transportation (Medical): No   Lack of Transportation (Non-Medical): No  Physical Activity: Insufficiently Active   Days of Exercise per Week: 2 days   Minutes of Exercise per Session: 20 min  Stress: No Stress Concern Present   Feeling of Stress : Not at all   Social Connections: Socially Isolated   Frequency of Communication with Friends and Family: More than three times a week   Frequency of Social Gatherings with Friends and Family: More than three times a week   Attends Religious Services: Never   Database administrator or Organizations: No   Attends Engineer, structural: Never   Marital Status: Never married  Catering manager Violence: Not At Risk   Fear of Current or Ex-Partner: No   Emotionally Abused: No   Physically Abused: No   Sexually Abused: No    ROS Review of Systems  Constitutional:  Negative for activity change, appetite change, diaphoresis, fatigue and fever.  Respiratory:  Negative for cough, chest tightness and shortness of breath.   Cardiovascular:  Positive for leg swelling. Negative for chest pain and palpitations.  Gastrointestinal: Negative.   Endocrine: Negative for cold intolerance, heat intolerance, polydipsia, polyphagia and polyuria.  Neurological: Negative.   Psychiatric/Behavioral: Negative.     Objective:   Today's Vitals: BP  135/71   Pulse 80   Temp 98.4 F (36.9 C) (Oral)   Ht 5\' 3"  (1.6 m)   Wt 292 lb 9.6 oz (132.7 kg)   SpO2 98%   BMI 51.83 kg/m   Physical Exam Vitals and nursing note reviewed.  Constitutional:      General: She is awake. She is not in acute distress.    Appearance: She is well-developed and well-groomed. She is obese. She is not ill-appearing or toxic-appearing.  HENT:     Head: Normocephalic.     Right Ear: Hearing normal.     Left Ear: Hearing normal.  Eyes:     General: Lids are normal.        Right eye: No discharge.        Left eye: No discharge.     Conjunctiva/sclera: Conjunctivae normal.     Pupils: Pupils are equal, round, and reactive to light.  Neck:     Thyroid: No thyromegaly.     Vascular: No carotid bruit.  Cardiovascular:     Rate and Rhythm: Normal rate and regular rhythm.     Heart sounds: Normal heart sounds. No murmur heard.   No  gallop.  Pulmonary:     Effort: Pulmonary effort is normal. No accessory muscle usage or respiratory distress.     Breath sounds: Normal breath sounds.  Abdominal:     General: Bowel sounds are normal.     Palpations: Abdomen is soft. There is no hepatomegaly.     Tenderness: There is no abdominal tenderness.  Musculoskeletal:     Cervical back: Normal range of motion and neck supple.     Right lower leg: 2+ Edema present.     Left lower leg: 2+ Edema present.  Skin:    General: Skin is warm and dry.  Neurological:     Mental Status: She is alert and oriented to person, place, and time.  Psychiatric:        Attention and Perception: Attention normal.        Mood and Affect: Mood normal.        Speech: Speech normal.        Behavior: Behavior normal. Behavior is cooperative.        Thought Content: Thought content normal.   Assessment & Plan:   Problem List Items Addressed This Visit       Cardiovascular and Mediastinum   Essential hypertension    Chronic, stable with BP at goal in office today.  Recommend she monitor BP at least a few mornings a week at home and document.  DASH diet at home.  Continue current medication regimen and adjust as needed.  Labs today: CMP, CBC, TSH.  Return in 6 months.       Relevant Medications   atorvastatin (LIPITOR) 10 MG tablet   torsemide (DEMADEX) 100 MG tablet     Digestive   GERD without esophagitis    Chronic, ongoing.  At this time continue Nexium as recently started this and will adjust as needed.  She does have a hiatal hernia, discussed with her.  May benefit from referral to GI for further assessment due to ongoing GERD issues with poor response to PPI -- may benefit from EGD.  Pt wishes to hold off at this time.  Mag level today.      Relevant Medications   esomeprazole (NEXIUM) 40 MG capsule   Other Relevant Orders   Magnesium     Endocrine   Hypothyroidism  Chronic, ongoing.  Continue current medication regimen and  check TSH, Free T4, and thyroid antibody today.  Adjust regimen as needed.      Relevant Medications   levothyroxine (SYNTHROID) 75 MCG tablet   Other Relevant Orders   TSH   T4, free   Thyroid peroxidase antibody     Musculoskeletal and Integument   Idiopathic chronic gout of right foot without tophus    Chronic, stable.  Denies any current pain.  Recheck uric acid level today.  Would benefit from Allopurinol, but refuses this at this time. Return to rheumatology as scheduled.      Relevant Orders   CBC with Differential/Platelet   Uric acid   Psoriasis    Chronic, stable, no current flares.  Monitor and treat as needed.        Other   Morbid obesity (HCC)    BMI 51.83.  Recommended eating smaller high protein, low fat meals more frequently and exercising 30 mins a day 5 times a week with a goal of 10-15lb weight loss in the next 3 months. Patient voiced their understanding and motivation to adhere to these recommendations.       Rheumatoid factor positive    At this time continue collaboration with rheumatology.      Mixed hyperlipidemia    Chronic, ongoing.  Continue current medication regimen and adjust as needed.  Lipid panel and CMP today.        Relevant Medications   atorvastatin (LIPITOR) 10 MG tablet   torsemide (DEMADEX) 100 MG tablet   Other Relevant Orders   Lipid Panel w/o Chol/HDL Ratio   Vitamin D deficiency    Chronic, ongoing.  Continue weekly Vitamin D, refills sent in.  Check Vit D level today and assess if DEXA has been performed, did not see on past records.      Relevant Orders   VITAMIN D 25 Hydroxy (Vit-D Deficiency, Fractures)   Elevated hemoglobin A1c measurement    Recheck A1c today and continue diet focus.  Initiate medication as needed.      Relevant Orders   HgB A1c   Comprehensive metabolic panel   Lymphedema in adult patient    Chronic, ongoing.  At this time continue Torsemide as ordered by previous provider, although limited  evidence for this being beneficial in lymphedema.  May benefit referral to vascular in future and assist on obtaining leg pumps, also may benefit from massage therapy.      Other Visit Diagnoses     Encounter to establish care    -  Primary       Outpatient Encounter Medications as of 02/12/2021  Medication Sig   Accu-Chek Softclix Lancets lancets To check blood sugar daily prior to eating in morning, with goal less the 130.   glucose blood test strip To check blood sugar daily prior to eating in morning, with goal less the 130.   naproxen (NAPROSYN) 500 MG tablet Take 1 tablet (500 mg total) by mouth 2 (two) times daily with a meal. As needed for pain   [DISCONTINUED] atorvastatin (LIPITOR) 10 MG tablet Take by mouth.   [DISCONTINUED] ergocalciferol (VITAMIN D2) 1.25 MG (50000 UT) capsule Take by mouth.   [DISCONTINUED] esomeprazole (NEXIUM) 40 MG capsule Take by mouth.   [DISCONTINUED] levothyroxine (SYNTHROID) 75 MCG tablet Take by mouth.   [DISCONTINUED] torsemide (DEMADEX) 100 MG tablet Take 100 mg by mouth daily.   atorvastatin (LIPITOR) 10 MG tablet Take 1 tablet (10 mg total) by  mouth daily.   ergocalciferol (VITAMIN D2) 1.25 MG (50000 UT) capsule Take 1 capsule (50,000 Units total) by mouth once a week.   esomeprazole (NEXIUM) 40 MG capsule Take 1 capsule (40 mg total) by mouth daily.   levothyroxine (SYNTHROID) 75 MCG tablet Take 1 tablet (75 mcg total) by mouth daily before breakfast.   Potassium Chloride CR (MICRO-K) 8 MEQ CPCR capsule CR  (Patient not taking: Reported on 02/12/2021)   torsemide (DEMADEX) 100 MG tablet Take 1 tablet (100 mg total) by mouth daily.   [DISCONTINUED] atorvastatin (LIPITOR) 10 MG tablet Take 10 mg by mouth every evening.  (Patient not taking: Reported on 02/12/2021)   [DISCONTINUED] oxyCODONE-acetaminophen (PERCOCET/ROXICET) 5-325 MG tablet Take 1 tablet by mouth every 6 (six) hours as needed for moderate pain or severe pain. (Patient not taking:  Reported on 02/12/2021)   [DISCONTINUED] RABEprazole (ACIPHEX) 20 MG tablet Take 20 mg by mouth every evening.  (Patient not taking: Reported on 02/12/2021)   No facility-administered encounter medications on file as of 02/12/2021.    Follow-up: Return in about 6 weeks (around 03/26/2021) for GERD.   Marjie Skiff, NP

## 2021-02-12 NOTE — Assessment & Plan Note (Signed)
BMI 51.83.  Recommended eating smaller high protein, low fat meals more frequently and exercising 30 mins a day 5 times a week with a goal of 10-15lb weight loss in the next 3 months. Patient voiced their understanding and motivation to adhere to these recommendations.  

## 2021-02-12 NOTE — Assessment & Plan Note (Signed)
Chronic, ongoing.  Continue current medication regimen and adjust as needed.  Lipid panel and CMP today.    

## 2021-02-12 NOTE — Assessment & Plan Note (Signed)
Chronic, stable with BP at goal in office today.  Recommend she monitor BP at least a few mornings a week at home and document.  DASH diet at home.  Continue current medication regimen and adjust as needed.  Labs today: CMP, CBC, TSH.  Return in 6 months.

## 2021-02-12 NOTE — Assessment & Plan Note (Signed)
Chronic, stable.  Denies any current pain.  Recheck uric acid level today.  Would benefit from Allopurinol, but refuses this at this time. Return to rheumatology as scheduled.

## 2021-02-12 NOTE — Assessment & Plan Note (Signed)
Recheck A1c today and continue diet focus.  Initiate medication as needed.

## 2021-02-13 LAB — T4, FREE: Free T4: 1.38 ng/dL (ref 0.82–1.77)

## 2021-02-13 LAB — THYROID PEROXIDASE ANTIBODY: Thyroperoxidase Ab SerPl-aCnc: 34 IU/mL (ref 0–34)

## 2021-02-13 LAB — VITAMIN D 25 HYDROXY (VIT D DEFICIENCY, FRACTURES): Vit D, 25-Hydroxy: 41.2 ng/mL (ref 30.0–100.0)

## 2021-02-13 LAB — CBC WITH DIFFERENTIAL/PLATELET
Basophils Absolute: 0 10*3/uL (ref 0.0–0.2)
Basos: 1 %
EOS (ABSOLUTE): 0.1 10*3/uL (ref 0.0–0.4)
Eos: 2 %
Hematocrit: 46.3 % (ref 34.0–46.6)
Hemoglobin: 14.8 g/dL (ref 11.1–15.9)
Immature Grans (Abs): 0 10*3/uL (ref 0.0–0.1)
Immature Granulocytes: 0 %
Lymphocytes Absolute: 1.5 10*3/uL (ref 0.7–3.1)
Lymphs: 24 %
MCH: 28.7 pg (ref 26.6–33.0)
MCHC: 32 g/dL (ref 31.5–35.7)
MCV: 90 fL (ref 79–97)
Monocytes Absolute: 0.4 10*3/uL (ref 0.1–0.9)
Monocytes: 6 %
Neutrophils Absolute: 4.3 10*3/uL (ref 1.4–7.0)
Neutrophils: 67 %
Platelets: 242 10*3/uL (ref 150–450)
RBC: 5.15 x10E6/uL (ref 3.77–5.28)
RDW: 13 % (ref 11.7–15.4)
WBC: 6.4 10*3/uL (ref 3.4–10.8)

## 2021-02-13 LAB — URIC ACID: Uric Acid: 8.7 mg/dL — ABNORMAL HIGH (ref 3.0–7.2)

## 2021-02-13 LAB — COMPREHENSIVE METABOLIC PANEL
ALT: 12 IU/L (ref 0–32)
AST: 16 IU/L (ref 0–40)
Albumin/Globulin Ratio: 1.7 (ref 1.2–2.2)
Albumin: 4.5 g/dL (ref 3.8–4.8)
Alkaline Phosphatase: 139 IU/L — ABNORMAL HIGH (ref 44–121)
BUN/Creatinine Ratio: 25 (ref 12–28)
BUN: 17 mg/dL (ref 8–27)
Bilirubin Total: 0.5 mg/dL (ref 0.0–1.2)
CO2: 28 mmol/L (ref 20–29)
Calcium: 9.6 mg/dL (ref 8.7–10.3)
Chloride: 98 mmol/L (ref 96–106)
Creatinine, Ser: 0.68 mg/dL (ref 0.57–1.00)
Globulin, Total: 2.6 g/dL (ref 1.5–4.5)
Glucose: 111 mg/dL — ABNORMAL HIGH (ref 65–99)
Potassium: 3.8 mmol/L (ref 3.5–5.2)
Sodium: 143 mmol/L (ref 134–144)
Total Protein: 7.1 g/dL (ref 6.0–8.5)
eGFR: 94 mL/min/{1.73_m2} (ref >59)

## 2021-02-13 LAB — TSH: TSH: 2.49 u[IU]/mL (ref 0.450–4.500)

## 2021-02-13 LAB — HEMOGLOBIN A1C
Est. average glucose Bld gHb Est-mCnc: 137 mg/dL
Hgb A1c MFr Bld: 6.4 % — ABNORMAL HIGH (ref 4.8–5.6)

## 2021-02-13 LAB — LIPID PANEL W/O CHOL/HDL RATIO
Cholesterol, Total: 204 mg/dL — ABNORMAL HIGH (ref 100–199)
HDL: 53 mg/dL (ref >39)
LDL Chol Calc (NIH): 125 mg/dL — ABNORMAL HIGH (ref 0–99)
Triglycerides: 145 mg/dL (ref 0–149)
VLDL Cholesterol Cal: 26 mg/dL (ref 5–40)

## 2021-02-13 LAB — MAGNESIUM: Magnesium: 2.1 mg/dL (ref 1.6–2.3)

## 2021-02-13 NOTE — Progress Notes (Signed)
Contacted via MyChart   Good morning Jennafer, it was a pleasure to meet you, your labs have returned: - CBC shows no anemia.  Kidney function, creatinine and eGFR, is normal. Liver function, AST and ALT, is normal. - Thyroid labs remain stable, can continue your current Levothyroxine dosing. - Vitamin D level normal, as is magnesium. - Uric acid levels is elevated, I would recommend we start you on a low dose of Allopurinol daily to help lower these levels, as rheumatology also recommended.  This may help avoid any gouty arthritis pain in future and help prevent flares. Goal is to get this less then 6.  Let me know if you would like to start this. - Cholesterol levels show LDL above goal, would like to see less then 70 for stroke prevention.  Are you taking your Atorvastatin daily?  Would you be okay if we increased this to 20 MG for better control? - The A1C is the diabetes testing we talked about, this looks at your blood sugars over the past 3 months and turns the average into a number.  Your number is 6.4%, meaning you are prediabetic.  Any number 5.7 to 6.4 is considered prediabetes and any number 6.5 or greater is considered diabetes.   I would recommend heavy focus on decreasing foods high in sugar and your intake of things like bread products, pasta, and rice.  The American Diabetes Association online has a large amount of information on diet changes to make.  We will recheck this number in 3 months to ensure you are not continuing to trend upwards and move into diabetes.  Have a good day.  Please let me know about questions above. Keep being stellar!!  Thank you for allowing me to participate in your care.  I appreciate you. Kindest regards, Shivangi Lutz

## 2021-02-20 ENCOUNTER — Other Ambulatory Visit: Payer: Self-pay | Admitting: Nurse Practitioner

## 2021-02-20 NOTE — Telephone Encounter (Signed)
Copied from CRM [id number]. Topic: Quick Communication - Rx Refill/Question >> Feb 20, 2021  2:32 PM Gaetana Michaelis A wrote: Medication: Accu-Chek FastClix Lancets lancets   Has the patient contacted their pharmacy? Yes.   (Agent: If no, request that the patient contact the pharmacy for the refill.) (Agent: If yes, when and what did the pharmacy advise?)  Preferred Pharmacy (with phone number or street name): [id number] [place], [place] - [address] AT [facility] OF [address] [facility]  Phone:  [phone] Fax:  [phone]  Agent: Please be advised that RX refills may take up to 3 business days. We ask that you follow-up with your pharmacy.

## 2021-02-20 NOTE — Telephone Encounter (Signed)
[facility] called and spoke to Michelle Marks, Michelle Marks about the refill(s) Accu-Check FastClix Lancets requested. The pharmacist says the Softclix Lancets was received and the patient picked up on 02/16/21. She says if these are not what the patient uses a new Rx will need to be sent in. I called the patient to ask about the Rx that was picked up. She says someone else picked up her medicines and she looked at the box and realized they were not the right one's. She says she will need the Accucheck Fast Clix Lancets.

## 2021-02-21 MED ORDER — ACCU-CHEK FASTCLIX LANCETS MISC: 1.0000 | Freq: Every day | 3 refills | Status: DC

## 2021-02-21 NOTE — Telephone Encounter (Signed)
RX started. Routing to provider for approval.

## 2021-03-27 ENCOUNTER — Other Ambulatory Visit: Payer: Self-pay

## 2021-03-27 ENCOUNTER — Ambulatory Visit (INDEPENDENT_AMBULATORY_CARE_PROVIDER_SITE_OTHER): Payer: Medicare HMO | Admitting: Nurse Practitioner

## 2021-03-27 ENCOUNTER — Encounter: Payer: Self-pay | Admitting: Nurse Practitioner

## 2021-03-27 VITALS — BP 121/75 | HR 75 | Temp 98.1°F | Wt 290.0 lb

## 2021-03-27 DIAGNOSIS — E782 Mixed hyperlipidemia: Secondary | ICD-10-CM | POA: Diagnosis not present

## 2021-03-27 DIAGNOSIS — K219 Gastro-esophageal reflux disease without esophagitis: Secondary | ICD-10-CM

## 2021-03-27 MED ORDER — ATORVASTATIN CALCIUM 20 MG PO TABS: 20.0000 mg | ORAL_TABLET | Freq: Every day | ORAL | 4 refills | Status: DC

## 2021-03-27 NOTE — Assessment & Plan Note (Signed)
Chronic, ongoing.  Increase Atorvastatin to 20 MG daily and adjust further as needed.  Lipid panel and CMP on return visit.

## 2021-03-27 NOTE — Progress Notes (Signed)
BP 121/75   Pulse 75   Temp 98.1 F (36.7 C) (Oral)   Wt 290 lb (131.5 kg)   SpO2 99%   BMI 51.37 kg/m    Subjective:    Patient ID: Michelle Marks, female    DOB: December 18, 1950, 70 y.o.   MRN: [id number]  HPI: Michelle Marks is a 70 y.o. female  Chief Complaint  Patient presents with   Gastroesophageal Reflux    Patient states she is here for follow up on GERD. Patient states she does have some questions for the provider. Patient states she does occasionally gets an upset stomach/diarrhea/discomfort and thinks it may be the Nexium.    GERD Currently taking Nexium which was changed to this on 01/17/21 at urgent care visit.  Previously was on Protonix, but felt this stopped working.  Has a hiatal hernia.  She reports occasional stomach discomfort and diarrhea at first with Nexium -- but at this time heart burn totally better. GERD control status: stable Satisfied with current treatment? yes Heartburn frequency: varies Medication side effects: no  Medication compliance: fluctuating Previous GERD medications: Protonix Antacid use frequency:  Mylanta if needed Nature: nausea Location: epigastric Heartburn duration: 30 minutes Alleviatiating factors:  nothing Aggravating factors: foods Dysphagia: no Odynophagia:  no Hematemesis: no Blood in stool: no EGD: no   HYPERLIPIDEMIA Currently Atorvastatin 10 MG daily == recent LDL above goal. Hyperlipidemia status: good compliance Satisfied with current treatment?  yes Side effects:  no Medication compliance: good compliance Past cholesterol meds: Atorvastatin Supplements: none Aspirin:  no The 10-year ASCVD risk score (Arnett DK, et al., 2019) is: 21%   Values used to calculate the score:     Age: 10 years     Sex: Female     Is Non-Hispanic African American: No     Diabetic: Yes     Tobacco smoker: No     Systolic Blood Pressure: 008 mmHg     Is BP treated: Yes     HDL Cholesterol: 53 mg/dL     Total Cholesterol: 204  mg/dL Chest pain:  no Coronary artery disease:  no Family history CAD:  no Family history early CAD:  no   Relevant past medical, surgical, family and social history reviewed and updated as indicated. Interim medical history since our last visit reviewed. Allergies and medications reviewed and updated.  Review of Systems  Constitutional:  Negative for activity change, appetite change, diaphoresis, fatigue and fever.  Respiratory:  Negative for cough, chest tightness and shortness of breath.   Cardiovascular:  Negative for chest pain and palpitations.  Gastrointestinal: Negative.   Endocrine: Negative for cold intolerance, heat intolerance, polydipsia, polyphagia and polyuria.  Neurological: Negative.   Psychiatric/Behavioral: Negative.     Per HPI unless specifically indicated above     Objective:    BP 121/75   Pulse 75   Temp 98.1 F (36.7 C) (Oral)   Wt 290 lb (131.5 kg)   SpO2 99%   BMI 51.37 kg/m   Wt Readings from Last 3 Encounters:  03/27/21 290 lb (131.5 kg)  02/12/21 292 lb 9.6 oz (132.7 kg)  12/10/16 (!) 309 lb 6.4 oz (140.3 kg)    Physical Exam Vitals and nursing note reviewed.  Constitutional:      General: She is awake. She is not in acute distress.    Appearance: She is well-developed and well-groomed. She is obese. She is not ill-appearing or toxic-appearing.  HENT:     Head: Normocephalic.  Right Ear: Hearing normal.     Left Ear: Hearing normal.  Eyes:     General: Lids are normal.        Right eye: No discharge.        Left eye: No discharge.     Conjunctiva/sclera: Conjunctivae normal.     Pupils: Pupils are equal, round, and reactive to light.  Neck:     Thyroid: No thyromegaly.     Vascular: No carotid bruit.  Cardiovascular:     Rate and Rhythm: Normal rate and regular rhythm.     Heart sounds: Normal heart sounds. No murmur heard.   No gallop.  Pulmonary:     Effort: Pulmonary effort is normal. No accessory muscle usage or  respiratory distress.     Breath sounds: Normal breath sounds.  Abdominal:     General: Bowel sounds are normal.     Palpations: Abdomen is soft. There is no hepatomegaly.     Tenderness: There is no abdominal tenderness.  Musculoskeletal:     Cervical back: Normal range of motion and neck supple.     Right lower leg: 2+ Edema present.     Left lower leg: 2+ Edema present.  Skin:    General: Skin is warm and dry.  Neurological:     Mental Status: She is alert and oriented to person, place, and time.  Psychiatric:        Attention and Perception: Attention normal.        Mood and Affect: Mood normal.        Speech: Speech normal.        Behavior: Behavior normal. Behavior is cooperative.        Thought Content: Thought content normal.    Results for orders placed or performed in visit on 02/12/21  HgB A1c  Result Value Ref Range   Hgb A1c MFr Bld 6.4 (H) 4.8 - 5.6 %   Est. average glucose Bld gHb Est-mCnc 137 mg/dL  CBC with Differential/Platelet  Result Value Ref Range   WBC 6.4 3.4 - 10.8 x10E3/uL   RBC 5.15 3.77 - 5.28 x10E6/uL   Hemoglobin 14.8 11.1 - 15.9 g/dL   Hematocrit 46.3 34.0 - 46.6 %   MCV 90 79 - 97 fL   MCH 28.7 26.6 - 33.0 pg   MCHC 32.0 31.5 - 35.7 g/dL   RDW 13.0 11.7 - 15.4 %   Platelets 242 150 - 450 x10E3/uL   Neutrophils 67 Not Estab. %   Lymphs 24 Not Estab. %   Monocytes 6 Not Estab. %   Eos 2 Not Estab. %   Basos 1 Not Estab. %   Neutrophils Absolute 4.3 1.4 - 7.0 x10E3/uL   Lymphocytes Absolute 1.5 0.7 - 3.1 x10E3/uL   Monocytes Absolute 0.4 0.1 - 0.9 x10E3/uL   EOS (ABSOLUTE) 0.1 0.0 - 0.4 x10E3/uL   Basophils Absolute 0.0 0.0 - 0.2 x10E3/uL   Immature Granulocytes 0 Not Estab. %   Immature Grans (Abs) 0.0 0.0 - 0.1 x10E3/uL  Comprehensive metabolic panel  Result Value Ref Range   Glucose 111 (H) 65 - 99 mg/dL   BUN 17 8 - 27 mg/dL   Creatinine, Ser 0.68 0.57 - 1.00 mg/dL   eGFR 94 >59 mL/min/1.73   BUN/Creatinine Ratio 25 12 - 28    Sodium 143 134 - 144 mmol/L   Potassium 3.8 3.5 - 5.2 mmol/L   Chloride 98 96 - 106 mmol/L   CO2 28 20 - 29  mmol/L   Calcium 9.6 8.7 - 10.3 mg/dL   Total Protein 7.1 6.0 - 8.5 g/dL   Albumin 4.5 3.8 - 4.8 g/dL   Globulin, Total 2.6 1.5 - 4.5 g/dL   Albumin/Globulin Ratio 1.7 1.2 - 2.2   Bilirubin Total 0.5 0.0 - 1.2 mg/dL   Alkaline Phosphatase 139 (H) 44 - 121 IU/L   AST 16 0 - 40 IU/L   ALT 12 0 - 32 IU/L  Lipid Panel w/o Chol/HDL Ratio  Result Value Ref Range   Cholesterol, Total 204 (H) 100 - 199 mg/dL   Triglycerides 145 0 - 149 mg/dL   HDL 53 >39 mg/dL   VLDL Cholesterol Cal 26 5 - 40 mg/dL   LDL Chol Calc (NIH) 125 (H) 0 - 99 mg/dL  TSH  Result Value Ref Range   TSH 2.490 0.450 - 4.500 uIU/mL  T4, free  Result Value Ref Range   Free T4 1.38 0.82 - 1.77 ng/dL  Thyroid peroxidase antibody  Result Value Ref Range   Thyroperoxidase Ab SerPl-aCnc 34 0 - 34 IU/mL  Uric acid  Result Value Ref Range   Uric Acid 8.7 (H) 3.0 - 7.2 mg/dL  VITAMIN D 25 Hydroxy (Vit-D Deficiency, Fractures)  Result Value Ref Range   Vit D, 25-Hydroxy 41.2 30.0 - 100.0 ng/mL  Magnesium  Result Value Ref Range   Magnesium 2.1 1.6 - 2.3 mg/dL      Assessment & Plan:   Problem List Items Addressed This Visit       Digestive   GERD without esophagitis    Chronic, ongoing.  At this time continue Nexium is offering benefit.  She does have a hiatal hernia, discussed with her.  May benefit from referral to GI if ongoing GERD issues with poor response to medications -- may benefit from EGD.  Pt wishes to hold off at this time.  Mag level up to date.        Other   Mixed hyperlipidemia    Chronic, ongoing.  Increase Atorvastatin to 20 MG daily and adjust further as needed.  Lipid panel and CMP on return visit.      Relevant Medications   atorvastatin (LIPITOR) 20 MG tablet     Follow up plan: Return in about 6 months (around 09/25/2021) for HTN/HLD, PREDIABETES, GERD, URIC ACID, VITAMIN  D.

## 2021-03-27 NOTE — Assessment & Plan Note (Addendum)
Chronic, ongoing.  At this time continue Nexium is offering benefit.  She does have a hiatal hernia, discussed with her.  May benefit from referral to GI if ongoing GERD issues with poor response to medications -- may benefit from EGD.  Pt wishes to hold off at this time.  Mag level up to date.

## 2021-03-27 NOTE — Patient Instructions (Signed)
Prediabetes Eating Plan °Prediabetes is a condition that causes blood sugar (glucose) levels to be higher than normal. This increases the risk for developing type 2 diabetes (type 2 diabetes mellitus). Working with a health care provider or nutrition specialist (dietitian) to make diet and lifestyle changes can help prevent the onset of diabetes. These changes may help you: °Control your blood glucose levels. °Improve your cholesterol levels. °Manage your blood pressure. °What are tips for following this plan? °Reading food labels °Read food labels to check the amount of fat, salt (sodium), and sugar in prepackaged foods. Avoid foods that have: °Saturated fats. °Trans fats. °Added sugars. °Avoid foods that have more than 300 milligrams (mg) of sodium per serving. Limit your sodium intake to less than 2,300 mg each day. °Shopping °Avoid buying pre-made and processed foods. °Avoid buying drinks with added sugar. °Cooking °Cook with olive oil. Do not use butter, lard, or ghee. °Bake, broil, grill, steam, or boil foods. Avoid frying. °Meal planning ° °Work with your dietitian to create an eating plan that is right for you. This may include tracking how many calories you take in each day. Use a food diary, notebook, or mobile application to track what you eat at each meal. °Consider following a Mediterranean diet. This includes: °Eating several servings of fresh fruits and vegetables each day. °Eating fish at least twice a week. °Eating one serving each day of whole grains, beans, nuts, and seeds. °Using olive oil instead of other fats. °Limiting alcohol. °Limiting red meat. °Using nonfat or low-fat dairy products. °Consider following a plant-based diet. This includes dietary choices that focus on eating mostly vegetables and fruit, grains, beans, nuts, and seeds. °If you have high blood pressure, you may need to limit your sodium intake or follow a diet such as the DASH (Dietary Approaches to Stop Hypertension) eating  plan. The DASH diet aims to lower high blood pressure. °Lifestyle °Set weight loss goals with help from your health care team. It is recommended that most people with prediabetes lose 7% of their body weight. °Exercise for at least 30 minutes 5 or more days a week. °Attend a support group or seek support from a mental health counselor. °Take over-the-counter and prescription medicines only as told by your health care provider. °What foods are recommended? °Fruits °Berries. Bananas. Apples. Oranges. Grapes. Papaya. Mango. Pomegranate. Kiwi. Grapefruit. Cherries. °Vegetables °Lettuce. Spinach. Peas. Beets. Cauliflower. Cabbage. Broccoli. Carrots. Tomatoes. Squash. Eggplant. Herbs. Peppers. Onions. Cucumbers. Brussels sprouts. °Grains °Whole grains, such as whole-wheat or whole-grain breads, crackers, cereals, and pasta. Unsweetened oatmeal. Bulgur. Barley. Quinoa. Brown rice. Corn or whole-wheat flour tortillas or taco shells. °Meats and other proteins °Seafood. Poultry without skin. Lean cuts of pork and beef. Tofu. Eggs. Nuts. Beans. °Dairy °Low-fat or fat-free dairy products, such as yogurt, cottage cheese, and cheese. °Beverages °Water. Tea. Coffee. Sugar-free or diet soda. Seltzer water. Low-fat or nonfat milk. Milk alternatives, such as soy or almond milk. °Fats and oils °Olive oil. Canola oil. Sunflower oil. Grapeseed oil. Avocado. Walnuts. °Sweets and desserts °Sugar-free or low-fat pudding. Sugar-free or low-fat ice cream and other frozen treats. °Seasonings and condiments °Herbs. Sodium-free spices. Mustard. Relish. Low-salt, low-sugar ketchup. Low-salt, low-sugar barbecue sauce. Low-fat or fat-free mayonnaise. °The items listed above may not be a complete list of recommended foods and beverages. Contact a dietitian for more information. °What foods are not recommended? °Fruits °Fruits canned with syrup. °Vegetables °Canned vegetables. Frozen vegetables with butter or cream sauce. °Grains °Refined white  flour and flour   products, such as bread, pasta, snack foods, and cereals. °Meats and other proteins °Fatty cuts of meat. Poultry with skin. Breaded or fried meat. Processed meats. °Dairy °Full-fat yogurt, cheese, or milk. °Beverages °Sweetened drinks, such as iced tea and soda. °Fats and oils °Butter. Lard. Ghee. °Sweets and desserts °Baked goods, such as cake, cupcakes, pastries, cookies, and cheesecake. °Seasonings and condiments °Spice mixes with added salt. Ketchup. Barbecue sauce. Mayonnaise. °The items listed above may not be a complete list of foods and beverages that are not recommended. Contact a dietitian for more information. °Where to find more information °American Diabetes Association: www.diabetes.org °Summary °You may need to make diet and lifestyle changes to help prevent the onset of diabetes. These changes can help you control blood sugar, improve cholesterol levels, and manage blood pressure. °Set weight loss goals with help from your health care team. It is recommended that most people with prediabetes lose 7% of their body weight. °Consider following a Mediterranean diet. This includes eating plenty of fresh fruits and vegetables, whole grains, beans, nuts, seeds, fish, and low-fat dairy, and using olive oil instead of other fats. °This information is not intended to replace advice given to you by your health care provider. Make sure you discuss any questions you have with your health care provider. °Document Revised: 09/02/2019 Document Reviewed: 09/02/2019 °[employer] Patient Education © 2022 [employer] ° °

## 2021-05-29 ENCOUNTER — Ambulatory Visit (INDEPENDENT_AMBULATORY_CARE_PROVIDER_SITE_OTHER): Payer: Medicare HMO | Admitting: *Deleted

## 2021-05-29 DIAGNOSIS — Z0000 Encounter for general adult medical examination without abnormal findings: Secondary | ICD-10-CM

## 2021-05-29 NOTE — Progress Notes (Signed)
Subjective:   Michelle Marks is a 70 y.o. female who presents for Medicare Annual (Subsequent) preventive examination.  I connected with  [name] on 05/29/21 by a telephone enabled telemedicine application and verified that I am speaking with the correct person using two identifiers.   I discussed the limitations of evaluation and management by telemedicine. The patient expressed understanding and agreed to proceed.  Patient location: home  Provider location: Tele-Health  not in office    Review of Systems     Cardiac Risk Factors include: advanced age (>83men, >35 women);hypertension;obesity (BMI >30kg/m2);sedentary lifestyle     Objective:    Today's Vitals   There is no height or weight on file to calculate BMI.  Advanced Directives 05/29/2021 12/02/2016 11/26/2016  Does Patient Have a Medical Advance Directive? Yes No No  Type of Advance Directive Living will - -  Would patient like information on creating a medical advance directive? - No - Patient declined No - Patient declined    Current Medications (verified) Outpatient Encounter Medications as of 05/29/2021  Medication Sig   Accu-Chek FastClix Lancets MISC 1 each by Does not apply route daily before breakfast.   atorvastatin (LIPITOR) 20 MG tablet Take 1 tablet (20 mg total) by mouth daily.   ergocalciferol (VITAMIN D2) 1.25 MG (50000 UT) capsule Take 1 capsule (50,000 Units total) by mouth once a week.   esomeprazole (NEXIUM) 40 MG capsule Take 1 capsule (40 mg total) by mouth daily.   glucose blood test strip To check blood sugar daily prior to eating in morning, with goal less the 130.   levothyroxine (SYNTHROID) 75 MCG tablet Take 1 tablet (75 mcg total) by mouth daily before breakfast.   naproxen (NAPROSYN) 500 MG tablet Take 1 tablet (500 mg total) by mouth 2 (two) times daily with a meal. As needed for pain   torsemide (DEMADEX) 100 MG tablet Take 1 tablet (100 mg total) by mouth daily.   No  facility-administered encounter medications on file as of 05/29/2021.    Allergies (verified) Sulfa antibiotics   History: Past Medical History:  Diagnosis Date   Anxiety    Arthritis    Difficult intubation    Edema, leg    Endometrial polyp    s/p Hysteroscopy D&C with polypectomy in 11/2016   GERD (gastroesophageal reflux disease)    Hyperlipidemia    Morbid obesity (HCC) 10/24/2016   Past Surgical History:  Procedure Laterality Date   BREAST CYST EXCISION Right age 80   benign   BREAST SURGERY Right    Cyst Removal   CERVICAL POLYPECTOMY N/A 12/02/2016   Procedure: CERVICAL POLYPECTOMY;  Surgeon: Hildred Laser, MD;  Location: [facility] ORS;  Service: Gynecology;  Laterality: N/A;   DILATION AND CURETTAGE OF UTERUS     HYSTEROSCOPY     HYSTEROSCOPY WITH D & C N/A 12/02/2016   Procedure: DILATATION AND CURETTAGE /HYSTEROSCOPY;  Surgeon: Hildred Laser, MD;  Location: [facility] ORS;  Service: Gynecology;  Laterality: N/A;   JOINT REPLACEMENT Left    Total Knee Replacement, Dr. Ernest Pine, [facility]   KNEE ARTHROSCOPY Left    TONSILLECTOMY     as a child   Family History  Problem Relation Age of Onset   Cancer Mother    Diabetes Mother    Hypertension Mother    Diabetes Father    Pancreatitis Father    Cancer Sister    Social History   Socioeconomic History   Marital status: Single  Spouse name: Not on file   Number of children: Not on file   Years of education: Not on file   Highest education level: Not on file  Occupational History   Not on file  Tobacco Use   Smoking status: Former    Types: Cigarettes    Quit date: 09/16/2003    Years since quitting: 17.7   Smokeless tobacco: Never  Vaping Use   Vaping Use: Never used  Substance and Sexual Activity   Alcohol use: Yes    Comment: occas   Drug use: No   Sexual activity: Not Currently    Birth control/protection: None  Other Topics Concern   Not on file  Social History Narrative   Not on file   Social Determinants  of Health   Financial Resource Strain: Low Risk    Difficulty of Paying Living Expenses: Not hard at all  Food Insecurity: No Food Insecurity   Worried About Programme researcher, broadcasting/film/video in the Last Year: Never true   Ran Out of Food in the Last Year: Never true  Transportation Needs: No Transportation Needs   Lack of Transportation (Medical): No   Lack of Transportation (Non-Medical): No  Physical Activity: Inactive   Days of Exercise per Week: 0 days   Minutes of Exercise per Session: 0 min  Stress: No Stress Concern Present   Feeling of Stress : Only a little  Social Connections: Socially Isolated   Frequency of Communication with Friends and Family: More than three times a week   Frequency of Social Gatherings with Friends and Family: Twice a week   Attends Religious Services: Never   Diplomatic Services operational officer: No   Attends Engineer, structural: Never   Marital Status: Never married    Tobacco Counseling Counseling given: Not Answered   Clinical Intake:  Pre-visit preparation completed: No  Pain : No/denies pain     Nutritional Risks: None Diabetes: No  How often do you need to have someone help you when you read instructions, pamphlets, or other written materials from your doctor or pharmacy?: 1 - Never  Diabetic?  no  Interpreter Needed?: No  Information entered by :: Remi Haggard LPN   Activities of Daily Living In your present state of health, do you have any difficulty performing the following activities: 05/29/2021 02/12/2021  Hearing? N N  Vision? N N  Difficulty concentrating or making decisions? N N  Walking or climbing stairs? Y N  Dressing or bathing? N N  Doing errands, shopping? N N  Using the Toilet? N -  In the past six months, have you accidently leaked urine? N -  Do you have problems with loss of bowel control? N -  Managing your Medications? N -  Managing your Finances? N -  Housekeeping or managing your Housekeeping? N -   Some recent data might be hidden    Patient Care Team: Marjie Skiff, NP as PCP - General (Nurse Practitioner)  Indicate any recent Medical Services you may have received from other than [facility] providers in the past year (date may be approximate).     Assessment:   This is a routine wellness examination for [place].  Hearing/Vision screen Hearing Screening - Comments:: No trouble hearing Vision Screening - Comments:: Clydene Pugh Up to date  Dietary issues and exercise activities discussed: Current Exercise Habits: The patient does not participate in regular exercise at present, Exercise limited by: orthopedic condition(s)   Goals Addressed  This Visit's Progress    Weight (lb) < 200 lb (90.7 kg)         Depression Screen PHQ 2/9 Scores 05/29/2021 02/12/2021  PHQ - 2 Score 0 0    Fall Risk Fall Risk  05/29/2021 03/27/2021  Falls in the past year? 0 0  Number falls in past yr: 0 0  Injury with Fall? 0 0  Risk for fall due to : - No Fall Risks  Follow up Falls evaluation completed;Education provided;Falls prevention discussed Falls evaluation completed    FALL RISK PREVENTION PERTAINING TO THE HOME:  Any stairs in or around the home? No  If so, are there any without handrails? No  Home free of loose throw rugs in walkways, pet beds, electrical cords, etc? Yes  Adequate lighting in your home to reduce risk of falls? Yes   ASSISTIVE DEVICES UTILIZED TO PREVENT FALLS:  Life alert? No  Use of a cane, walker or w/c? Yes  Grab bars in the bathroom? Yes  Shower chair or bench in shower? No  Elevated toilet seat or a handicapped toilet? No   TIMED UP AND GO:  Was the test performed? No .    Cognitive Function:  Normal cognitive status assessed by direct observation by this Nurse Health Advisor. No abnormalities found.          Immunizations Immunization History  Administered Date(s) Administered   PFIZER(Purple Top)SARS-COV-2 Vaccination  09/21/2019, 10/19/2019, 05/20/2020, 05/21/2021    TDAP status: Due, Education has been provided regarding the importance of this vaccine. Advised may receive this vaccine at local pharmacy or Health Dept. Aware to provide a copy of the vaccination record if obtained from local pharmacy or Health Dept. Verbalized acceptance and understanding.  Flu Vaccine status: Due, Education has been provided regarding the importance of this vaccine. Advised may receive this vaccine at local pharmacy or Health Dept. Aware to provide a copy of the vaccination record if obtained from local pharmacy or Health Dept. Verbalized acceptance and understanding.  Pneumococcal vaccine status: Due, Education has been provided regarding the importance of this vaccine. Advised may receive this vaccine at local pharmacy or Health Dept. Aware to provide a copy of the vaccination record if obtained from local pharmacy or Health Dept. Verbalized acceptance and understanding.  Covid-19 vaccine status: Completed vaccines  Qualifies for Shingles Vaccine? No   Zostavax completed No   Shingrix Completed?: No.    Education has been provided regarding the importance of this vaccine. Patient has been advised to call insurance company to determine out of pocket expense if they have not yet received this vaccine. Advised may also receive vaccine at local pharmacy or Health Dept. Verbalized acceptance and understanding.  Screening Tests Health Maintenance  Topic Date Due   Pneumonia Vaccine 41+ Years old (1 - PCV) Never done   URINE MICROALBUMIN  Never done   Hepatitis C Screening  Never done   TETANUS/TDAP  Never done   Fecal DNA (Cologuard)  Never done   DEXA SCAN  Never done   Zoster Vaccines- Shingrix (1 of 2) 08/27/2021 (Originally 03/11/2001)   COVID-19 Vaccine (5 - Booster for Pfizer series) 07/16/2021   HEMOGLOBIN A1C  08/14/2021   MAMMOGRAM  07/10/2022   HPV VACCINES  Aged Out   INFLUENZA VACCINE  Discontinued   FOOT EXAM   Discontinued   OPHTHALMOLOGY EXAM  Discontinued    Health Maintenance  Health Maintenance Due  Topic Date Due   Pneumonia Vaccine 25+ Years old (  1 - PCV) Never done   URINE MICROALBUMIN  Never done   Hepatitis C Screening  Never done   TETANUS/TDAP  Never done   Fecal DNA (Cologuard)  Never done   DEXA SCAN  Never done    Colonoscopy  will discuss with pcp in April  Mammogram status: Completed  . Repeat every year  Bone Density  will speak to pcp April  Lung Cancer Screening: (Low Dose CT Chest recommended if Age 64-80 years, 30 pack-year currently smoking OR have quit w/in 15years.) does not qualify.   Lung Cancer Screening Referral:   Additional Screening:  Hepatitis C Screening: does not qualify; Completed   Vision Screening: Recommended annual ophthalmology exams for early detection of glaucoma and other disorders of the eye. Is the patient up to date with their annual eye exam?  Yes  Who is the provider or what is the name of the office in which the patient attends annual eye exams? [facility] If pt is not establish with a provider, would they like to be referred to a provider to establish care? No .   Dental Screening: Recommended annual dental exams for proper oral hygiene  Community Resource Referral / Chronic Care Management: CRR required this visit?  No   CCM required this visit?  No      Plan:     I have personally reviewed and noted the following in the patients chart:   Medical and social history Use of alcohol, tobacco or illicit drugs  Current medications and supplements including opioid prescriptions.  Functional ability and status Nutritional status Physical activity Advanced directives List of other physicians Hospitalizations, surgeries, and ER visits in previous 12 months Vitals Screenings to include cognitive, depression, and falls Referrals and appointments  In addition, I have reviewed and discussed with patient certain preventive  protocols, quality metrics, and best practice recommendations. A written personalized care plan for preventive services as well as general preventive health recommendations were provided to patient.     Remi Haggard, LPN   09/81/1914   Nurse Notes:

## 2021-05-29 NOTE — Patient Instructions (Signed)
Michelle Marks , Thank you for taking time to come for your Medicare Wellness Visit. I appreciate your ongoing commitment to your health goals. Please review the following plan we discussed and let me know if I can assist you in the future.   Screening recommendations/referrals: Colonoscopy: Education provided Mammogram: up to date Bone Density: Education provided Recommended yearly ophthalmology/optometry visit for glaucoma screening and checkup Recommended yearly dental visit for hygiene and checkup  Vaccinations: Influenza vaccine: Education provided Pneumococcal vaccine: Education provided Tdap vaccine: Education provided Shingles vaccine: Education provided    Advanced directives: Education provided  Conditions/risks identified:   Next appointment: 09-25-2021  @ 1:00 [employer]   Preventive Care 65 Years and Older, Female Preventive care refers to lifestyle choices and visits with your health care provider that can promote health and wellness. What does preventive care include? A yearly physical exam. This is also called an annual well check. Dental exams once or twice a year. Routine eye exams. Ask your health care provider how often you should have your eyes checked. Personal lifestyle choices, including: Daily care of your teeth and gums. Regular physical activity. Eating a healthy diet. Avoiding tobacco and drug use. Limiting alcohol use. Practicing safe sex. Taking low-dose aspirin every day. Taking vitamin and mineral supplements as recommended by your health care provider. What happens during an annual well check? The services and screenings done by your health care provider during your annual well check will depend on your age, overall health, lifestyle risk factors, and family history of disease. Counseling  Your health care provider may ask you questions about your: Alcohol use. Tobacco use. Drug use. Emotional well-being. Home and relationship  well-being. Sexual activity. Eating habits. History of falls. Memory and ability to understand (cognition). Work and work Astronomer. Reproductive health. Screening  You may have the following tests or measurements: Height, weight, and BMI. Blood pressure. Lipid and cholesterol levels. These may be checked every 5 years, or more frequently if you are over 48 years old. Skin check. Lung cancer screening. You may have this screening every year starting at age 65 if you have a 30-pack-year history of smoking and currently smoke or have quit within the past 15 years. Fecal occult blood test (FOBT) of the stool. You may have this test every year starting at age 45. Flexible sigmoidoscopy or colonoscopy. You may have a sigmoidoscopy every 5 years or a colonoscopy every 10 years starting at age 67. Hepatitis C blood test. Hepatitis B blood test. Sexually transmitted disease (STD) testing. Diabetes screening. This is done by checking your blood sugar (glucose) after you have not eaten for a while (fasting). You may have this done every 1-3 years. Bone density scan. This is done to screen for osteoporosis. You may have this done starting at age 10. Mammogram. This may be done every 1-2 years. Talk to your health care provider about how often you should have regular mammograms. Talk with your health care provider about your test results, treatment options, and if necessary, the need for more tests. Vaccines  Your health care provider may recommend certain vaccines, such as: Influenza vaccine. This is recommended every year. Tetanus, diphtheria, and acellular pertussis (Tdap, Td) vaccine. You may need a Td booster every 10 years. Zoster vaccine. You may need this after age 26. Pneumococcal 13-valent conjugate (PCV13) vaccine. One dose is recommended after age 79. Pneumococcal polysaccharide (PPSV23) vaccine. One dose is recommended after age 44. Talk to your health care provider about which  screenings  and vaccines you need and how often you need them. This information is not intended to replace advice given to you by your health care provider. Make sure you discuss any questions you have with your health care provider. Document Released: 06/30/2015 Document Revised: 02/21/2016 Document Reviewed: 04/04/2015 Elsevier Interactive Patient Education  2017 [employer].  Fall Prevention in the Home Falls can cause injuries. They can happen to people of all ages. There are many things you can do to make your home safe and to help prevent falls. What can I do on the outside of my home? Regularly fix the edges of walkways and driveways and fix any cracks. Remove anything that might make you trip as you walk through a door, such as a raised step or threshold. Trim any bushes or trees on the path to your home. Use bright outdoor lighting. Clear any walking paths of anything that might make someone trip, such as rocks or tools. Regularly check to see if handrails are loose or broken. Make sure that both sides of any steps have handrails. Any raised decks and porches should have guardrails on the edges. Have any leaves, snow, or ice cleared regularly. Use sand or salt on walking paths during winter. Clean up any spills in your garage right away. This includes oil or grease spills. What can I do in the bathroom? Use night lights. Install grab bars by the toilet and in the tub and shower. Do not use towel bars as grab bars. Use non-skid mats or decals in the tub or shower. If you need to sit down in the shower, use a plastic, non-slip stool. Keep the floor dry. Clean up any water that spills on the floor as soon as it happens. Remove soap buildup in the tub or shower regularly. Attach bath mats securely with double-sided non-slip rug tape. Do not have throw rugs and other things on the floor that can make you trip. What can I do in the bedroom? Use night lights. Make sure that you have a  light by your bed that is easy to reach. Do not use any sheets or blankets that are too big for your bed. They should not hang down onto the floor. Have a firm chair that has side arms. You can use this for support while you get dressed. Do not have throw rugs and other things on the floor that can make you trip. What can I do in the kitchen? Clean up any spills right away. Avoid walking on wet floors. Keep items that you use a lot in easy-to-reach places. If you need to reach something above you, use a strong step stool that has a grab bar. Keep electrical cords out of the way. Do not use floor polish or wax that makes floors slippery. If you must use wax, use non-skid floor wax. Do not have throw rugs and other things on the floor that can make you trip. What can I do with my stairs? Do not leave any items on the stairs. Make sure that there are handrails on both sides of the stairs and use them. Fix handrails that are broken or loose. Make sure that handrails are as long as the stairways. Check any carpeting to make sure that it is firmly attached to the stairs. Fix any carpet that is loose or worn. Avoid having throw rugs at the top or bottom of the stairs. If you do have throw rugs, attach them to the floor with carpet tape. Make  sure that you have a light switch at the top of the stairs and the bottom of the stairs. If you do not have them, ask someone to add them for you. What else can I do to help prevent falls? Wear shoes that: Do not have high heels. Have rubber bottoms. Are comfortable and fit you well. Are closed at the toe. Do not wear sandals. If you use a stepladder: Make sure that it is fully opened. Do not climb a closed stepladder. Make sure that both sides of the stepladder are locked into place. Ask someone to hold it for you, if possible. Clearly mark and make sure that you can see: Any grab bars or handrails. First and last steps. Where the edge of each step  is. Use tools that help you move around (mobility aids) if they are needed. These include: Canes. Walkers. Scooters. Crutches. Turn on the lights when you go into a dark area. Replace any light bulbs as soon as they burn out. Set up your furniture so you have a clear path. Avoid moving your furniture around. If any of your floors are uneven, fix them. If there are any pets around you, be aware of where they are. Review your medicines with your doctor. Some medicines can make you feel dizzy. This can increase your chance of falling. Ask your doctor what other things that you can do to help prevent falls. This information is not intended to replace advice given to you by your health care provider. Make sure you discuss any questions you have with your health care provider. Document Released: 03/30/2009 Document Revised: 11/09/2015 Document Reviewed: 07/08/2014 Elsevier Interactive Patient Education  2017 [employer].

## 2021-07-25 ENCOUNTER — Ambulatory Visit: Payer: Self-pay

## 2021-07-25 NOTE — Telephone Encounter (Signed)
°  Chief Complaint: neck pain Symptoms: neck pain constant and gets worse when bending and turning  Frequency: 2 days since got new mattress Pertinent Negatives: NA Disposition: [] ED /[] Urgent Care (no appt availability in office) / [x] Appointment(In office/virtual)/ []  [facility] [facility]/ [] Home Care/ [] Refused Recommended Disposition /[] [facility]/ []  Follow-up with PCP Additional Notes:    Reason for Disposition  [1] MODERATE neck pain (e.g., interferes with normal activities) AND [2] present > 3 days  Answer Assessment - Initial Assessment Questions 1. ONSET: "When did the pain begin?"      2 days 2. LOCATION: "Where does it hurt?"      Bottom of neck and shoulder area 3. PATTERN "Does the pain come and go, or has it been constant since it started?"      Constant 4. SEVERITY: "How bad is the pain?"  (Scale 1-10; or mild, moderate, severe)   - NO PAIN (0): no pain or only slight stiffness    - MILD (1-3): doesn't interfere with normal activities    - MODERATE (4-7): interferes with normal activities or awakens from sleep    - SEVERE (8-10):  excruciating pain, unable to do any normal activities      6/7 5. RADIATION: "Does the pain go anywhere else, shoot into your arms?"     No 6. CORD SYMPTOMS: "Any weakness or numbness of the arms or legs?"     No 7. CAUSE: "What do you think is causing the neck pain?"     Unsure if from getting new mattress 8. NECK OVERUSE: "Any recent activities that involved turning or twisting the neck?"     No 9. OTHER SYMPTOMS: "Do you have any other symptoms?" (e.g., headache, fever, chest pain, difficulty breathing, neck swelling)     No  Protocols used: Neck Pain or Stiffness-A-AH

## 2021-07-26 ENCOUNTER — Telehealth: Payer: Medicare HMO | Admitting: Internal Medicine

## 2021-09-22 NOTE — Patient Instructions (Addendum)
Can take Tylenol 1000 MG by mouth three times a day as needed.  I would recommend taking 1000 MG at night, every night, for pain. ? ?Scheduled bone density with your mammogram. ? ?Hypothyroidism ?Hypothyroidism is when the thyroid gland does not make enough of certain hormones (it is underactive). The thyroid gland is a small gland located in the lower front part of the neck, just in front of the windpipe (trachea). This gland makes hormones that help control how the body uses food for energy (metabolism) as well as how the heart and brain function. These hormones also play a role in keeping your bones strong. When the thyroid is underactive, it produces too little of the hormones thyroxine (T4) and triiodothyronine (T3). ?What are the causes? ?This condition may be caused by: ?Hashimoto's disease. This is a disease in which the body's disease-fighting system (immune system) attacks the thyroid gland. This is the most common cause. ?Viral infections. ?Pregnancy. ?Certain medicines. ?Birth defects. ?Past radiation treatments to the head or neck for cancer. ?Past treatment with radioactive iodine. ?Past exposure to radiation in the environment. ?Past surgical removal of part or all of the thyroid. ?Problems with a gland in the center of the brain (pituitary gland). ?Lack of enough iodine in the diet. ?What increases the risk? ?You are more likely to develop this condition if: ?You are female. ?You have a family history of thyroid conditions. ?You use a medicine called lithium. ?You take medicines that affect the immune system (immunosuppressants). ?What are the signs or symptoms? ?Symptoms of this condition include: ?Feeling as though you have no energy (lethargy). ?Not being able to tolerate cold. ?Weight gain that is not explained by a change in diet or exercise habits. ?Lack of appetite. ?Dry skin. ?Coarse hair. ?Menstrual irregularity. ?Slowing of thought processes. ?Constipation. ?Sadness or depression. ?How is  this diagnosed? ?This condition may be diagnosed based on: ?Your symptoms, your medical history, and a physical exam. ?Blood tests. ?You may also have imaging tests, such as an ultrasound or MRI. ?How is this treated? ?This condition is treated with medicine that replaces the thyroid hormones that your body does not make. After you begin treatment, it may take several weeks for symptoms to go away. ?Follow these instructions at home: ?Take over-the-counter and prescription medicines only as told by your health care provider. ?If you start taking any new medicines, tell your health care provider. ?Keep all follow-up visits as told by your health care provider. This is important. ?As your condition improves, your dosage of thyroid hormone medicine may change. ?You will need to have blood tests regularly so that your health care provider can monitor your condition. ?Contact a health care provider if: ?Your symptoms do not get better with treatment. ?You are taking thyroid hormone replacement medicine and you: ?Sweat a lot. ?Have tremors. ?Feel anxious. ?Lose weight rapidly. ?Cannot tolerate heat. ?Have emotional swings. ?Have diarrhea. ?Feel weak. ?Get help right away if you have: ?Chest pain. ?An irregular heartbeat. ?A rapid heartbeat. ?Difficulty breathing. ?Summary ?Hypothyroidism is when the thyroid gland does not make enough of certain hormones (it is underactive). ?When the thyroid is underactive, it produces too little of the hormones thyroxine (T4) and triiodothyronine (T3). ?The most common cause is Hashimoto's disease, a disease in which the body's disease-fighting system (immune system) attacks the thyroid gland. The condition can also be caused by viral infections, medicine, pregnancy, or past radiation treatment to the head or neck. ?Symptoms may include weight gain, dry skin, constipation,  feeling as though you do not have energy, and not being able to tolerate cold. ?This condition is treated with  medicine to replace the thyroid hormones that your body does not make. ?This information is not intended to replace advice given to you by your health care provider. Make sure you discuss any questions you have with your health care provider. ?Document Revised: 02/08/2021 Document Reviewed: 02/17/2020 ?Elsevier Patient Education ? 2022 [employer] ? ?

## 2021-09-25 ENCOUNTER — Ambulatory Visit (INDEPENDENT_AMBULATORY_CARE_PROVIDER_SITE_OTHER): Payer: Medicare HMO | Admitting: Nurse Practitioner

## 2021-09-25 ENCOUNTER — Encounter: Payer: Self-pay | Admitting: Nurse Practitioner

## 2021-09-25 DIAGNOSIS — Z1159 Encounter for screening for other viral diseases: Secondary | ICD-10-CM

## 2021-09-25 DIAGNOSIS — L409 Psoriasis, unspecified: Secondary | ICD-10-CM

## 2021-09-25 DIAGNOSIS — R808 Other proteinuria: Secondary | ICD-10-CM

## 2021-09-25 DIAGNOSIS — Z1231 Encounter for screening mammogram for malignant neoplasm of breast: Secondary | ICD-10-CM

## 2021-09-25 DIAGNOSIS — I890 Lymphedema, not elsewhere classified: Secondary | ICD-10-CM

## 2021-09-25 DIAGNOSIS — M1A0710 Idiopathic chronic gout, right ankle and foot, without tophus (tophi): Secondary | ICD-10-CM | POA: Diagnosis not present

## 2021-09-25 DIAGNOSIS — Z1211 Encounter for screening for malignant neoplasm of colon: Secondary | ICD-10-CM

## 2021-09-25 DIAGNOSIS — E1129 Type 2 diabetes mellitus with other diabetic kidney complication: Secondary | ICD-10-CM | POA: Insufficient documentation

## 2021-09-25 DIAGNOSIS — R7309 Other abnormal glucose: Secondary | ICD-10-CM

## 2021-09-25 DIAGNOSIS — R030 Elevated blood-pressure reading, without diagnosis of hypertension: Secondary | ICD-10-CM | POA: Diagnosis not present

## 2021-09-25 DIAGNOSIS — R809 Proteinuria, unspecified: Secondary | ICD-10-CM | POA: Insufficient documentation

## 2021-09-25 DIAGNOSIS — E782 Mixed hyperlipidemia: Secondary | ICD-10-CM | POA: Diagnosis not present

## 2021-09-25 DIAGNOSIS — K219 Gastro-esophageal reflux disease without esophagitis: Secondary | ICD-10-CM

## 2021-09-25 DIAGNOSIS — Z780 Asymptomatic menopausal state: Secondary | ICD-10-CM

## 2021-09-25 DIAGNOSIS — E039 Hypothyroidism, unspecified: Secondary | ICD-10-CM

## 2021-09-25 LAB — MICROALBUMIN, URINE WAIVED
Creatinine, Urine Waived: 100 mg/dL (ref 10–300)
Microalb, Ur Waived: 80 mg/L — ABNORMAL HIGH (ref 0–19)

## 2021-09-25 LAB — BAYER DCA HB A1C WAIVED: HB A1C (BAYER DCA - WAIVED): 6.2 % — ABNORMAL HIGH (ref 4.8–5.6)

## 2021-09-25 MED ORDER — ALLOPURINOL 100 MG PO TABS: 100.0000 mg | ORAL_TABLET | Freq: Every day | ORAL | 12 refills | Status: DC

## 2021-09-25 NOTE — Assessment & Plan Note (Signed)
Chronic, ongoing.  Continue current medication regimen and check TSH, Free T4 today.  Adjust regimen as needed. ?

## 2021-09-25 NOTE — Assessment & Plan Note (Signed)
Chronic, ongoing.  Denies any current pain.  Recheck uric acid level today.  She agrees to trial of Allopurinol today, which suspect will help some of her pain -- will start Allopurinol 100 MG daily and recheck all levels next visit. ?

## 2021-09-25 NOTE — Assessment & Plan Note (Signed)
Urine ALB 80 on labs today, may benefit from lose dose ACE or ARB in future for this.  Recheck at physical and further discuss. ?

## 2021-09-25 NOTE — Assessment & Plan Note (Signed)
BMI 51.87.  Recommended eating smaller high protein, low fat meals more frequently and exercising 30 mins a day 5 times a week with a goal of 10-15lb weight loss in the next 3 months. Patient voiced their understanding and motivation to adhere to these recommendations. ? ?

## 2021-09-25 NOTE — Assessment & Plan Note (Signed)
Chronic, ongoing.  At this time continue Torsemide as ordered by previous provider, although limited evidence for this being beneficial in lymphedema. Recommend continue compression hose at home and take Tylenol 1000 MG TID as needed.  Referral to vascular to see if candidate for compression pumps. ?

## 2021-09-25 NOTE — Assessment & Plan Note (Signed)
Chronic, ongoing.  Continue current medication regimen and adjust as needed.  Lipid panel and CMP today.    

## 2021-09-25 NOTE — Assessment & Plan Note (Signed)
A1c downward trend today at 6.2% and urine ALB 80, may benefit from low dose ACE or ARB in future for this.  Continue diet focus.  Initiate medication as needed. ?

## 2021-09-25 NOTE — Assessment & Plan Note (Signed)
Chronic, stable with BP at goal in office today.  Recommend she monitor BP at least a few mornings a week at home and document.  DASH diet at home.  Continue current medication regimen and adjust as needed.  Labs today: CMP, CBC, TSH.  Return in 6 months.  

## 2021-09-25 NOTE — Assessment & Plan Note (Signed)
Chronic, ongoing.  At this time continue Nexium is offering benefit.  She does have a hiatal hernia, discussed with her.  May benefit from referral to GI if ongoing GERD issues with poor response to medications -- may benefit from EGD.  Pt wishes to hold off at this time.  Mag level up to date. 

## 2021-09-25 NOTE — Progress Notes (Signed)
? ?BP 135/72   Pulse 73   Temp 97.6 ?F (36.4 ?C) (Oral)   Ht 5' 3"  (1.6 m)   Wt 292 lb 12.8 oz (132.8 kg)   SpO2 95%   BMI 51.87 kg/m?   ? ?Subjective:  ? ? Patient ID: Michelle Marks, female    DOB: 07-19-1950, 71 y.o.   MRN: [id number] ? ?HPI: ?Michelle Marks is a 71 y.o. female ? ?Chief Complaint  ?Patient presents with  ? Hypertension  ? Hyperlipidemia  ? Gastroesophageal Reflux  ? ?HYPERTENSION / HYPERLIPIDEMIA ?Continues on Torsemide for lymphedema and BP and Atorvastatin. She has tried compression hose without much benefit and struggles continually getting these on and Tylenol does not always offer benefit. ? ?A1c last visit 6.4%, is working on diet. ?Satisfied with current treatment? yes ?Duration of hypertension: chronic ?BP monitoring frequency: not  checking ?BP range:  ?BP medication side effects: no ?Duration of hyperlipidemia: chronic ?Cholesterol medication side effects: no ?Cholesterol supplements: none ?Medication compliance: good compliance ?Aspirin: no ?Recent stressors: no ?Recurrent headaches: no ?Visual changes: no ?Palpitations: no ?Dyspnea: no ?Chest pain: no ?Lower extremity edema: yes ?Dizzy/lightheaded: no   ?  ?GERD ?Has been taking Nexium.  Has a hiatal hernia. ?GERD control status: exacerbated ?Satisfied with current treatment? yes ?Heartburn frequency: varies ?Medication side effects: no  ?Medication compliance: fluctuating ?Previous GERD medications: Protonix ?Antacid use frequency:  Mylanta if needed ?Nature: nausea ?Location: epigastric ?Heartburn duration: 30 minutes ?Alleviatiating factors:  nothing ?Aggravating factors: foods ?Dysphagia: no ?Odynophagia:  no ?Hematemesis: no ?Blood in stool: no ?EGD: no  ? ?GOUT ?No current medications for this, last uric acid level was 8.7 on 02/12/21.  She declined medications. ? ?History of elevation in Rf on review past labs and saw rheumatology on 08/21/20 = no signs of RA.  They offered to start her on Allopurinol for elevation in uric  acid levels, but she declined at that time. ?  ?Has psoriasis, has had since in her 82's.  Does not want to take medication for this.  Goes to dermatology, Dr. Kellie Moor.   ?Duration:chronic ?Right 1st metatarsophalangeal pain: no ?Left 1st metatarsophalangeal pain: yes in past ?Right knee pain: no ?Left knee pain: no ?Swelling: no ?Redness: no ?Trauma: no ?Recent dietary change or indiscretion: no ?Fevers: no ?Nausea/vomiting: no ?Aggravating factors nothing ?Alleviating factors: nothing ?Status:  better ?Treatments attempted: nothing ?  ?HYPOTHYROIDISM ?Continues on Levothyroxine 75 MCG daily.   ?Thyroid control status:controlled ?Satisfied with current treatment? yes ?Medication side effects: no ?Medication compliance: good compliance ?Etiology of hypothyroidism:  ?Recent dose adjustment:no ?Fatigue: no ?Cold intolerance: no ?Heat intolerance: no ?Weight gain: no ?Weight loss: no ?Constipation: no ?Diarrhea/loose stools: no ?Palpitations: no ?Lower extremity edema: no ?Anxiety/depressed mood: no ? ?Relevant past medical, surgical, family and social history reviewed and updated as indicated. Interim medical history since our last visit reviewed. ?Allergies and medications reviewed and updated. ? ?Review of Systems  ?Constitutional:  Negative for activity change, appetite change, diaphoresis, fatigue and fever.  ?Respiratory:  Negative for cough, chest tightness and shortness of breath.   ?Cardiovascular:  Positive for leg swelling. Negative for chest pain and palpitations.  ?Gastrointestinal: Negative.   ?Endocrine: Negative for cold intolerance, heat intolerance, polydipsia, polyphagia and polyuria.  ?Musculoskeletal:  Positive for arthralgias.  ?Neurological: Negative.   ?Psychiatric/Behavioral: Negative.    ? ?Per HPI unless specifically indicated above ? ?   ?Objective:  ?  ?BP 135/72   Pulse 73   Temp 97.6 ?F (36.4 ?  C) (Oral)   Ht 5' 3"  (1.6 m)   Wt 292 lb 12.8 oz (132.8 kg)   SpO2 95%   BMI 51.87 kg/m?    ?Wt Readings from Last 3 Encounters:  ?09/25/21 292 lb 12.8 oz (132.8 kg)  ?03/27/21 290 lb (131.5 kg)  ?02/12/21 292 lb 9.6 oz (132.7 kg)  ?  ?Physical Exam ?Vitals and nursing note reviewed.  ?Constitutional:   ?   General: She is awake. She is not in acute distress. ?   Appearance: She is well-developed and well-groomed. She is obese. She is not ill-appearing or toxic-appearing.  ?HENT:  ?   Head: Normocephalic.  ?   Right Ear: Hearing normal.  ?   Left Ear: Hearing normal.  ?Eyes:  ?   General: Lids are normal.     ?   Right eye: No discharge.     ?   Left eye: No discharge.  ?   Conjunctiva/sclera: Conjunctivae normal.  ?   Pupils: Pupils are equal, round, and reactive to light.  ?Neck:  ?   Thyroid: No thyromegaly.  ?   Vascular: No carotid bruit.  ?Cardiovascular:  ?   Rate and Rhythm: Normal rate and regular rhythm.  ?   Heart sounds: Normal heart sounds. No murmur heard. ?  No gallop.  ?Pulmonary:  ?   Effort: Pulmonary effort is normal. No accessory muscle usage or respiratory distress.  ?   Breath sounds: Normal breath sounds.  ?Abdominal:  ?   General: Bowel sounds are normal.  ?   Palpations: Abdomen is soft. There is no hepatomegaly.  ?   Tenderness: There is no abdominal tenderness.  ?Musculoskeletal:  ?   Cervical back: Normal range of motion and neck supple.  ?   Right lower leg: 2+ Edema present.  ?   Left lower leg: 2+ Edema present.  ?Skin: ?   General: Skin is warm and dry.  ?Neurological:  ?   Mental Status: She is alert and oriented to person, place, and time.  ?Psychiatric:     ?   Attention and Perception: Attention normal.     ?   Mood and Affect: Mood normal.     ?   Speech: Speech normal.     ?   Behavior: Behavior normal. Behavior is cooperative.     ?   Thought Content: Thought content normal.  ? ?Results for orders placed or performed in visit on 09/25/21  ?Bayer DCA Hb A1c Waived  ?Result Value Ref Range  ? HB A1C (BAYER DCA - WAIVED) 6.2 (H) 4.8 - 5.6 %  ?Microalbumin, Urine Waived   ?Result Value Ref Range  ? Microalb, Ur Waived 80 (H) 0 - 19 mg/L  ? Creatinine, Urine Waived 100 10 - 300 mg/dL  ? Microalb/Creat Ratio 30-300 (H) <30 mg/g  ? ?   ?Assessment & Plan:  ? ?Problem List Items Addressed This Visit   ? ?  ? Cardiovascular and Mediastinum  ? 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  Chronic, stable with BP at goal in office today.  Recommend she monitor BP at least a few mornings a week at home and document.  DASH diet at home.  Continue current medication regimen and adjust as needed.  Labs today: CMP, CBC, TSH.  Return in 6 months. ? ?  ?  ? Relevant Orders  ? Comprehensive metabolic panel  ? CBC with Differential/Platelet  ?  ? Digestive  ? GERD  without esophagitis  ?  Chronic, ongoing.  At this time continue Nexium is offering benefit.  She does have a hiatal hernia, discussed with her.  May benefit from referral to GI if ongoing GERD issues with poor response to medications -- may benefit from EGD.  Pt wishes to hold off at this time.  Mag level up to date. ?  ?  ?  ? Endocrine  ? Hypothyroidism  ?  Chronic, ongoing.  Continue current medication regimen and check TSH, Free T4 today.  Adjust regimen as needed. ?  ?  ? Relevant Orders  ? T4, free  ? TSH  ?  ? Musculoskeletal and Integument  ? Idiopathic chronic gout of right foot without tophus  ?  Chronic, ongoing.  Denies any current pain.  Recheck uric acid level today.  She agrees to trial of Allopurinol today, which suspect will help some of her pain -- will start Allopurinol 100 MG daily and recheck all levels next visit. ?  ?  ? Relevant Medications  ? allopurinol (ZYLOPRIM) 100 MG tablet  ? Other Relevant Orders  ? Uric acid  ? ANA w/Reflex if Positive  ? C-reactive protein  ? Sed Rate (ESR)  ? Psoriasis  ?  Chronic, stable, no current flares.  Monitor and treat as needed. ?  ?  ?  ? Other  ? Elevated hemoglobin A1c measurement  ?  A1c downward trend today at 6.2% and urine ALB 80, may benefit from low dose  ACE or ARB in future for this.  Continue diet focus.  Initiate medication as needed. ?  ?  ? Relevant Orders  ? Bayer DCA Hb A1c Waived (Completed)  ? Microalbumin, Urine Waived (Completed)  ? Lymphedema in adult

## 2021-09-25 NOTE — Assessment & Plan Note (Signed)
Chronic, stable, no current flares.  Monitor and treat as needed. ?

## 2021-09-26 LAB — COMPREHENSIVE METABOLIC PANEL
ALT: 13 IU/L (ref 0–32)
AST: 17 IU/L (ref 0–40)
Albumin/Globulin Ratio: 1.7 (ref 1.2–2.2)
Albumin: 4.4 g/dL (ref 3.8–4.8)
Alkaline Phosphatase: 145 IU/L — ABNORMAL HIGH (ref 44–121)
BUN/Creatinine Ratio: 28 (ref 12–28)
BUN: 16 mg/dL (ref 8–27)
Bilirubin Total: 0.5 mg/dL (ref 0.0–1.2)
CO2: 30 mmol/L — ABNORMAL HIGH (ref 20–29)
Calcium: 9.6 mg/dL (ref 8.7–10.3)
Chloride: 96 mmol/L (ref 96–106)
Creatinine, Ser: 0.58 mg/dL (ref 0.57–1.00)
Globulin, Total: 2.6 g/dL (ref 1.5–4.5)
Glucose: 105 mg/dL — ABNORMAL HIGH (ref 70–99)
Potassium: 3.6 mmol/L (ref 3.5–5.2)
Sodium: 140 mmol/L (ref 134–144)
Total Protein: 7 g/dL (ref 6.0–8.5)
eGFR: 97 mL/min/{1.73_m2} (ref >59)

## 2021-09-26 LAB — CBC WITH DIFFERENTIAL/PLATELET
Basophils Absolute: 0 10*3/uL (ref 0.0–0.2)
Basos: 1 %
EOS (ABSOLUTE): 0.2 10*3/uL (ref 0.0–0.4)
Eos: 3 %
Hematocrit: 43.7 % (ref 34.0–46.6)
Hemoglobin: 14.8 g/dL (ref 11.1–15.9)
Immature Grans (Abs): 0 10*3/uL (ref 0.0–0.1)
Immature Granulocytes: 0 %
Lymphocytes Absolute: 1.6 10*3/uL (ref 0.7–3.1)
Lymphs: 25 %
MCH: 29.3 pg (ref 26.6–33.0)
MCHC: 33.9 g/dL (ref 31.5–35.7)
MCV: 87 fL (ref 79–97)
Monocytes Absolute: 0.4 10*3/uL (ref 0.1–0.9)
Monocytes: 6 %
Neutrophils Absolute: 4.3 10*3/uL (ref 1.4–7.0)
Neutrophils: 65 %
Platelets: 257 10*3/uL (ref 150–450)
RBC: 5.05 x10E6/uL (ref 3.77–5.28)
RDW: 13 % (ref 11.7–15.4)
WBC: 6.5 10*3/uL (ref 3.4–10.8)

## 2021-09-26 LAB — T4, FREE: Free T4: 1.35 ng/dL (ref 0.82–1.77)

## 2021-09-26 LAB — LIPID PANEL W/O CHOL/HDL RATIO
Cholesterol, Total: 200 mg/dL — ABNORMAL HIGH (ref 100–199)
HDL: 57 mg/dL (ref >39)
LDL Chol Calc (NIH): 107 mg/dL — ABNORMAL HIGH (ref 0–99)
Triglycerides: 209 mg/dL — ABNORMAL HIGH (ref 0–149)
VLDL Cholesterol Cal: 36 mg/dL (ref 5–40)

## 2021-09-26 LAB — URIC ACID: Uric Acid: 8.1 mg/dL — ABNORMAL HIGH (ref 3.0–7.2)

## 2021-09-26 LAB — ANA W/REFLEX IF POSITIVE: Anti Nuclear Antibody (ANA): NEGATIVE

## 2021-09-26 LAB — C-REACTIVE PROTEIN: CRP: 3 mg/L (ref 0–10)

## 2021-09-26 LAB — SEDIMENTATION RATE: Sed Rate: 40 mm/hr (ref 0–40)

## 2021-09-26 LAB — HEPATITIS C ANTIBODY: Hep C Virus Ab: NONREACTIVE

## 2021-09-26 LAB — TSH: TSH: 2.03 u[IU]/mL (ref 0.450–4.500)

## 2021-09-26 NOTE — Progress Notes (Signed)
Contacted via [place] ?The 10-year ASCVD risk score (Arnett DK, et al., 2019) is: 25% ?  Values used to calculate the score: ?    Age: 71 years ?    Sex: Female ?    Is Non-Hispanic African American: No ?    Diabetic: Yes ?    Tobacco smoker: No ?    Systolic Blood Pressure: 562 mmHg ?    Is BP treated: Yes ?    HDL Cholesterol: 57 mg/dL ?    Total Cholesterol: 200 mg/dL ?Good evening Michelle Marks, your labs have returned: ?- Kidney function, creatinine and eGFR, remains normal, as is liver function, AST and ALT.  Alkaline phosphatase is a little elevated, we will monitor this.  Do you still have your gall bladder? ?- Cholesterol labs are still above goal, I recommend increasing your Atorvastatin to 40 MG, would you be okay with this?  If so let me know and I will send in dose increase. ?- Uric acid level is still high, which I suspect is causing some of your pain as ANA, CRP, and ESR are normal (these look for other inflammatory arthritis causes).  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  Start Allopurinol to have lower these uric acid levels and in long run goal to improve pain. ?- Thyroid levels stable, continue Levothyroxine dosing. ?- Hep C is negative:)  Any questions? ?Keep being amazing!!  Thank you for allowing me to participate in your care.  I appreciate you. ?Kindest regards, ?Michelle Marks ? ? ?

## 2021-10-09 DIAGNOSIS — Z1211 Encounter for screening for malignant neoplasm of colon: Secondary | ICD-10-CM | POA: Diagnosis not present

## 2021-10-18 LAB — COLOGUARD: COLOGUARD: NEGATIVE

## 2021-10-18 NOTE — Progress Notes (Signed)
Contacted via [place] ? ? ?Good afternoon Lashaye, your Cologuard has returned and this is negative!!  [place] news!!  Repeat in 3 years.:) ?Keep being incredible!!  Thank you for allowing me to participate in your care.  I appreciate you. ?Kindest regards, ?Saliyah Gillin ?

## 2021-11-21 ENCOUNTER — Ambulatory Visit (INDEPENDENT_AMBULATORY_CARE_PROVIDER_SITE_OTHER): Payer: Medicare HMO | Admitting: Nurse Practitioner

## 2021-11-21 ENCOUNTER — Encounter (INDEPENDENT_AMBULATORY_CARE_PROVIDER_SITE_OTHER): Payer: Self-pay | Admitting: Nurse Practitioner

## 2021-11-21 VITALS — BP 159/72 | HR 81 | Resp 17 | Ht 63.0 in | Wt 294.0 lb

## 2021-11-21 DIAGNOSIS — I890 Lymphedema, not elsewhere classified: Secondary | ICD-10-CM | POA: Diagnosis not present

## 2021-11-21 DIAGNOSIS — E782 Mixed hyperlipidemia: Secondary | ICD-10-CM

## 2021-11-26 ENCOUNTER — Telehealth: Payer: Self-pay | Admitting: Nurse Practitioner

## 2021-11-26 NOTE — Telephone Encounter (Signed)
Patient states she will call back to schedule an appt

## 2021-11-26 NOTE — Telephone Encounter (Signed)
Copied from CRM [id number]. Topic: General - Other >> Nov 26, 2021  1:01 PM [place] B wrote: Reason for CRM:pt called in wants to speak with Dr Harvest Dark has questions about her care and the med, allopurinol (ZYLOPRIM) 100 MG tablet . She says isn't taken this anymore.

## 2021-11-26 NOTE — Telephone Encounter (Signed)
Spoke with patient and she says she has a lot of questions. Patient says she has finally seen the vein and vascular doctor. Patient states she has researched the prescription and says she does not want to take it anymore. 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 Please advise?

## 2021-12-03 ENCOUNTER — Telehealth (INDEPENDENT_AMBULATORY_CARE_PROVIDER_SITE_OTHER): Payer: Self-pay

## 2021-12-03 NOTE — Telephone Encounter (Signed)
Patient called the office asking how long she should wear her compression wraps and also if should stop taking the her torsemide. Patient was last seen in the office 11/21/21. I informed patient that she can wear her wraps all day and remove at night. Also she was advise to contact the provider that prescribe her fluid pill to see if she can come off the medication

## 2021-12-04 ENCOUNTER — Encounter (INDEPENDENT_AMBULATORY_CARE_PROVIDER_SITE_OTHER): Payer: Self-pay | Admitting: Nurse Practitioner

## 2021-12-04 NOTE — Progress Notes (Signed)
Subjective:    Patient ID: Michelle Marks, female    DOB: 03-09-1951, 71 y.o.   MRN: [id number] Chief Complaint  Patient presents with   Establish Care    Referred by Dr Harvest Dark    Michelle Marks is a 71 year old female who presents today for follow-up evaluation regarding lymphedema.  The patient has previously worn compression but did not feel they provided much benefit therefore she is currently not wearing them.  She has been prescribed furosemide in the past and recently transition to torsemide by another provider but it also has not provided much benefit.  Currently there are no open wounds or ulcerations.    Review of Systems  Cardiovascular:  Positive for leg swelling.  All other systems reviewed and are negative.      Objective:   Physical Exam Vitals reviewed.  HENT:     Head: Normocephalic.  Cardiovascular:     Rate and Rhythm: Normal rate.  Pulmonary:     Effort: Pulmonary effort is normal.  Musculoskeletal:     Right lower leg: Edema present.     Left lower leg: Edema present.  Skin:    General: Skin is warm and dry.  Neurological:     Mental Status: She is alert and oriented to person, place, and time.  Psychiatric:        Mood and Affect: Mood normal.        Behavior: Behavior normal.        Thought Content: Thought content normal.        Judgment: Judgment normal.     BP (!) 159/72 (BP Location: Right Arm)   Pulse 81   Resp 17   Ht 5\' 3"  (1.6 m)   Wt 294 lb (133.4 kg)   BMI 52.08 kg/m   Past Medical History:  Diagnosis Date   Anxiety    Arthritis    Difficult intubation    Edema, leg    Endometrial polyp    s/p Hysteroscopy D&C with polypectomy in 11/2016   GERD (gastroesophageal reflux disease)    Hyperlipidemia    Morbid obesity (HCC) 10/24/2016    Social History   Socioeconomic History   Marital status: Single    Spouse name: Not on file   Number of children: Not on file   Years of education: Not on file   Highest education  level: Not on file  Occupational History   Not on file  Tobacco Use   Smoking status: Former    Types: Cigarettes    Quit date: 09/16/2003    Years since quitting: 18.2   Smokeless tobacco: Never  Vaping Use   Vaping Use: Never used  Substance and Sexual Activity   Alcohol use: Yes    Comment: occas   Drug use: No   Sexual activity: Not Currently    Birth control/protection: None  Other Topics Concern   Not on file  Social History Narrative   Not on file   Social Determinants of Health   Financial Resource Strain: Low Risk  (05/29/2021)   Overall Financial Resource Strain (CARDIA)    Difficulty of Paying Living Expenses: Not hard at all  Food Insecurity: No Food Insecurity (05/29/2021)   Hunger Vital Sign    Worried About Running Out of Food in the Last Year: Never true    Ran Out of Food in the Last Year: Never true  Transportation Needs: No Transportation Needs (05/29/2021)   PRAPARE - Transportation    Lack  of Transportation (Medical): No    Lack of Transportation (Non-Medical): No  Physical Activity: Inactive (05/29/2021)   Exercise Vital Sign    Days of Exercise per Week: 0 days    Minutes of Exercise per Session: 0 min  Stress: No Stress Concern Present (05/29/2021)   [employer] of Occupational Health - Occupational Stress Questionnaire    Feeling of Stress : Only a little  Social Connections: Socially Isolated (05/29/2021)   Social Connection and Isolation Panel [NHANES]    Frequency of Communication with Friends and Family: More than three times a week    Frequency of Social Gatherings with Friends and Family: Twice a week    Attends Religious Services: Never    Database administrator or Organizations: No    Attends Banker Meetings: Never    Marital Status: Never married  Intimate Partner Violence: Not At Risk (05/29/2021)   Humiliation, Afraid, Rape, and Kick questionnaire    Fear of Current or Ex-Partner: No    Emotionally Abused: No     Physically Abused: No    Sexually Abused: No    Past Surgical History:  Procedure Laterality Date   BREAST CYST EXCISION Right age 52   benign   BREAST SURGERY Right    Cyst Removal   CERVICAL POLYPECTOMY N/A 12/02/2016   Procedure: CERVICAL POLYPECTOMY;  Surgeon: Hildred Laser, MD;  Location: [facility];  Service: Gynecology;  Laterality: N/A;   DILATION AND CURETTAGE OF UTERUS     HYSTEROSCOPY     HYSTEROSCOPY WITH D & C N/A 12/02/2016   Procedure: DILATATION AND CURETTAGE /HYSTEROSCOPY;  Surgeon: Hildred Laser, MD;  Location: [facility];  Service: Gynecology;  Laterality: N/A;   JOINT REPLACEMENT Left    Total Knee Replacement, Dr. Ernest Pine, [facility]   KNEE ARTHROSCOPY Left    TONSILLECTOMY     as a child    Family History  Problem Relation Age of Onset   Cancer Mother    Diabetes Mother    Hypertension Mother    Diabetes Father    Pancreatitis Father    Cancer Sister     Allergies  Allergen Reactions   Sulfa Antibiotics Nausea Only       Latest Ref Rng & Units 09/25/2021    1:53 PM 02/12/2021    2:19 PM 12/02/2016    9:30 AM  CBC  WBC 3.4 - 10.8 x10E3/uL 6.5  6.4    Hemoglobin 11.1 - 15.9 g/dL 53.6  14.4  31.5   Hematocrit 34.0 - 46.6 % 43.7  46.3  40.0   Platelets 150 - 450 x10E3/uL 257  242        CMP     Component Value Date/Time   NA 140 09/25/2021 1353   K 3.6 09/25/2021 1353   CL 96 09/25/2021 1353   CO2 30 (H) 09/25/2021 1353   GLUCOSE 105 (H) 09/25/2021 1353   GLUCOSE 125 (H) 12/02/2016 0930   BUN 16 09/25/2021 1353   CREATININE 0.58 09/25/2021 1353   CALCIUM 9.6 09/25/2021 1353   PROT 7.0 09/25/2021 1353   ALBUMIN 4.4 09/25/2021 1353   AST 17 09/25/2021 1353   ALT 13 09/25/2021 1353   ALKPHOS 145 (H) 09/25/2021 1353   BILITOT 0.5 09/25/2021 1353     No results found.     Assessment & Plan:   1. Lymphedema in adult patient We have discussed conservative therapy tactics including consistent use of conservative therapy including  compression, elevation and activity.  Patient will engage in some conservative therapies and return in 8 weeks for discussion about possible lymphedema pump.  2. Mixed hyperlipidemia Continue statin as ordered and reviewed, no changes at this time    Current Outpatient Medications on File Prior to Visit  Medication Sig Dispense Refill   Accu-Chek FastClix Lancets MISC 1 each by Does not apply route daily before breakfast. 100 each 3   atorvastatin (LIPITOR) 20 MG tablet Take 1 tablet (20 mg total) by mouth daily. 90 tablet 4   ergocalciferol (VITAMIN D2) 1.25 MG (50000 UT) capsule Take 1 capsule (50,000 Units total) by mouth once a week. 12 capsule 4   esomeprazole (NEXIUM) 40 MG capsule Take 1 capsule (40 mg total) by mouth daily. 90 capsule 4   glucose blood test strip To check blood sugar daily prior to eating in morning, with goal less the 130. 100 each 12   levothyroxine (SYNTHROID) 75 MCG tablet Take 1 tablet (75 mcg total) by mouth daily before breakfast. 90 tablet 4   torsemide (DEMADEX) 100 MG tablet Take 1 tablet (100 mg total) by mouth daily. 90 tablet 4   allopurinol (ZYLOPRIM) 100 MG tablet Take 1 tablet (100 mg total) by mouth daily. (Patient not taking: Reported on 11/21/2021) 30 tablet 12   No current facility-administered medications on file prior to visit.    There are no Patient Instructions on file for this visit. No follow-ups on file.   Georgiana Spinner, NP

## 2022-01-16 ENCOUNTER — Encounter (INDEPENDENT_AMBULATORY_CARE_PROVIDER_SITE_OTHER): Payer: Self-pay | Admitting: Nurse Practitioner

## 2022-01-16 ENCOUNTER — Ambulatory Visit (INDEPENDENT_AMBULATORY_CARE_PROVIDER_SITE_OTHER): Payer: Medicare HMO | Admitting: Nurse Practitioner

## 2022-01-16 VITALS — BP 133/79 | HR 76 | Resp 16 | Wt 295.8 lb

## 2022-01-16 DIAGNOSIS — I890 Lymphedema, not elsewhere classified: Secondary | ICD-10-CM | POA: Diagnosis not present

## 2022-01-16 DIAGNOSIS — E782 Mixed hyperlipidemia: Secondary | ICD-10-CM | POA: Diagnosis not present

## 2022-02-04 ENCOUNTER — Encounter (INDEPENDENT_AMBULATORY_CARE_PROVIDER_SITE_OTHER): Payer: Self-pay | Admitting: Nurse Practitioner

## 2022-02-04 NOTE — Progress Notes (Signed)
Subjective:    Patient ID: Michelle Marks, female    DOB: [birth year], 71 y.o.   MRN: [id number] Chief Complaint  Patient presents with   Follow-up    8 week follow up    Michelle Marks is a 71 year old female who presents today for follow-up evaluation regarding lymphedema.  The patient has previously worn compression but did not feel they provided much benefit therefore she was not wearing them.  She has been prescribed furosemide in the past and recently transition to torsemide by another provider but it also has not provided much benefit.  Currently there are no open wounds or ulcerations.  At her follow-up today and notes that her swelling is better.  She continues to deny any open wounds or ulcerations.    Review of Systems  Cardiovascular:  Positive for leg swelling.  All other systems reviewed and are negative.      Objective:   Physical Exam Vitals reviewed.  HENT:     Head: Normocephalic.  Cardiovascular:     Rate and Rhythm: Normal rate.  Pulmonary:     Effort: Pulmonary effort is normal.  Musculoskeletal:     Left lower leg: Edema present.  Neurological:     Mental Status: She is alert and oriented to person, place, and time.  Psychiatric:        Mood and Affect: Mood normal.        Behavior: Behavior normal.        Thought Content: Thought content normal.        Judgment: Judgment normal.     BP 133/79 (BP Location: Right Arm)   Pulse 76   Resp 16   Wt 295 lb 12.8 oz (134.2 kg)   BMI 52.40 kg/m   Past Medical History:  Diagnosis Date   Anxiety    Arthritis    Difficult intubation    Edema, leg    Endometrial polyp    s/p Hysteroscopy D&C with polypectomy in 11/2016   GERD (gastroesophageal reflux disease)    Hyperlipidemia    Morbid obesity (HCC) 10/24/2016    Social History   Socioeconomic History   Marital status: Single    Spouse name: Not on file   Number of children: Not on file   Years of education: Not on file   Highest education  level: Not on file  Occupational History   Not on file  Tobacco Use   Smoking status: Former    Types: Cigarettes    Quit date: 09/16/2003    Years since quitting: 18.4   Smokeless tobacco: Never  Vaping Use   Vaping Use: Never used  Substance and Sexual Activity   Alcohol use: Yes    Comment: occas   Drug use: No   Sexual activity: Not Currently    Birth control/protection: None  Other Topics Concern   Not on file  Social History Narrative   Not on file   Social Determinants of Health   Financial Resource Strain: Low Risk  (05/29/2021)   Overall Financial Resource Strain (CARDIA)    Difficulty of Paying Living Expenses: Not hard at all  Food Insecurity: No Food Insecurity (05/29/2021)   Hunger Vital Sign    Worried About Running Out of Food in the Last Year: Never true    Ran Out of Food in the Last Year: Never true  Transportation Needs: No Transportation Needs (05/29/2021)   PRAPARE - Administrator, Civil Service (Medical): No  Lack of Transportation (Non-Medical): No  Physical Activity: Inactive (05/29/2021)   Exercise Vital Sign    Days of Exercise per Week: 0 days    Minutes of Exercise per Session: 0 min  Stress: No Stress Concern Present (05/29/2021)   [employer] of Occupational Health - Occupational Stress Questionnaire    Feeling of Stress : Only a little  Social Connections: Socially Isolated (05/29/2021)   Social Connection and Isolation Panel [NHANES]    Frequency of Communication with Friends and Family: More than three times a week    Frequency of Social Gatherings with Friends and Family: Twice a week    Attends Religious Services: Never    Database administrator or Organizations: No    Attends Banker Meetings: Never    Marital Status: Never married  Intimate Partner Violence: Not At Risk (05/29/2021)   Humiliation, Afraid, Rape, and Kick questionnaire    Fear of Current or Ex-Partner: No    Emotionally Abused: No     Physically Abused: No    Sexually Abused: No    Past Surgical History:  Procedure Laterality Date   BREAST CYST EXCISION Right age 22   benign   BREAST SURGERY Right    Cyst Removal   CERVICAL POLYPECTOMY N/A 12/02/2016   Procedure: CERVICAL POLYPECTOMY;  Surgeon: Hildred Laser, MD;  Location: [facility];  Service: Gynecology;  Laterality: N/A;   DILATION AND CURETTAGE OF UTERUS     HYSTEROSCOPY     HYSTEROSCOPY WITH D & C N/A 12/02/2016   Procedure: DILATATION AND CURETTAGE /HYSTEROSCOPY;  Surgeon: Hildred Laser, MD;  Location: [facility];  Service: Gynecology;  Laterality: N/A;   JOINT REPLACEMENT Left    Total Knee Replacement, Dr. Ernest Pine, [facility]   KNEE ARTHROSCOPY Left    TONSILLECTOMY     as a child    Family History  Problem Relation Age of Onset   Cancer Mother    Diabetes Mother    Hypertension Mother    Diabetes Father    Pancreatitis Father    Cancer Sister     Allergies  Allergen Reactions   Sulfa Antibiotics Nausea Only       Latest Ref Rng & Units 09/25/2021    1:53 PM 02/12/2021    2:19 PM 12/02/2016    9:30 AM  CBC  WBC 3.4 - 10.8 x10E3/uL 6.5  6.4    Hemoglobin 11.1 - 15.9 g/dL 25.0  53.9  76.7   Hematocrit 34.0 - 46.6 % 43.7  46.3  40.0   Platelets 150 - 450 x10E3/uL 257  242        CMP     Component Value Date/Time   NA 140 09/25/2021 1353   K 3.6 09/25/2021 1353   CL 96 09/25/2021 1353   CO2 30 (H) 09/25/2021 1353   GLUCOSE 105 (H) 09/25/2021 1353   GLUCOSE 125 (H) 12/02/2016 0930   BUN 16 09/25/2021 1353   CREATININE 0.58 09/25/2021 1353   CALCIUM 9.6 09/25/2021 1353   PROT 7.0 09/25/2021 1353   ALBUMIN 4.4 09/25/2021 1353   AST 17 09/25/2021 1353   ALT 13 09/25/2021 1353   ALKPHOS 145 (H) 09/25/2021 1353   BILITOT 0.5 09/25/2021 1353     No results found.     Assessment & Plan:   1. Lymphedema in adult patient Recommend:  No surgery or intervention at this point in time.    I have reviewed my discussion with the patient  regarding venous  insufficiency and secondary lymph edema and why it  causes symptoms. I have discussed with the patient the chronic skin changes that accompany these problems and the long term sequela such as ulceration and infection.  Patient will continue wearing graduated compression on a daily basis a prescription, if needed, was given to the patient to keep this updated. The patient will  put the compression on first thing in the morning and removing them in the evening. The patient is instructed specifically not to sleep in the compression.  In addition, behavioral modification including elevation during the day will be continued.  Diet and salt restriction will also be helpful.  Previous duplex ultrasound of the lower extremities shows normal deep venous system, superficial reflux was not present.   Following the review of the ultrasound the patient will follow up in 6 months to reassess the degree of swelling and the control that graduated compression is offering.   The patient can be assessed for a Lymph Pump at that time.  However, at this time the patient states they are satisfied with the control compression and elevation is yielding.     2. Mixed hyperlipidemia Continue statin as ordered and reviewed, no changes at this time    Current Outpatient Medications on File Prior to Visit  Medication Sig Dispense Refill   Accu-Chek FastClix Lancets MISC 1 each by Does not apply route daily before breakfast. 100 each 3   atorvastatin (LIPITOR) 20 MG tablet Take 1 tablet (20 mg total) by mouth daily. 90 tablet 4   ergocalciferol (VITAMIN D2) 1.25 MG (50000 UT) capsule Take 1 capsule (50,000 Units total) by mouth once a week. 12 capsule 4   esomeprazole (NEXIUM) 40 MG capsule Take 1 capsule (40 mg total) by mouth daily. 90 capsule 4   glucose blood test strip To check blood sugar daily prior to eating in morning, with goal less the 130. 100 each 12   levothyroxine (SYNTHROID) 75 MCG tablet Take 1  tablet (75 mcg total) by mouth daily before breakfast. 90 tablet 4   torsemide (DEMADEX) 100 MG tablet Take 1 tablet (100 mg total) by mouth daily. 90 tablet 4   allopurinol (ZYLOPRIM) 100 MG tablet Take 1 tablet (100 mg total) by mouth daily. (Patient not taking: Reported on 11/21/2021) 30 tablet 12   No current facility-administered medications on file prior to visit.    There are no Patient Instructions on file for this visit. No follow-ups on file.   Georgiana Spinner, NP

## 2022-03-27 ENCOUNTER — Ambulatory Visit: Payer: Medicare HMO | Admitting: Nurse Practitioner

## 2022-03-27 DIAGNOSIS — R7309 Other abnormal glucose: Secondary | ICD-10-CM

## 2022-03-27 DIAGNOSIS — R030 Elevated blood-pressure reading, without diagnosis of hypertension: Secondary | ICD-10-CM

## 2022-03-27 DIAGNOSIS — I890 Lymphedema, not elsewhere classified: Secondary | ICD-10-CM

## 2022-03-27 DIAGNOSIS — R808 Other proteinuria: Secondary | ICD-10-CM

## 2022-03-27 DIAGNOSIS — E782 Mixed hyperlipidemia: Secondary | ICD-10-CM

## 2022-04-15 ENCOUNTER — Encounter (INDEPENDENT_AMBULATORY_CARE_PROVIDER_SITE_OTHER): Payer: Self-pay

## 2022-04-18 ENCOUNTER — Telehealth: Payer: Self-pay | Admitting: Nurse Practitioner

## 2022-04-18 NOTE — Telephone Encounter (Signed)
Medication Refill - Medication: Generic Nexium 40 mg  Has the patient contacted their pharmacy? Yes.   (Agent: If no, request that the patient contact the pharmacy for the refill. If patient does not wish to contact the pharmacy document the reason why and proceed with request.) (Agent: If yes, when and what did the pharmacy advise?)  Preferred Pharmacy (with phone number or street name): [facility] mail order Phone # [phone] Fax#  [phone]  Has the patient been seen for an appointment in the last year OR does the patient have an upcoming appointment? Yes.    Agent: Please be advised that RX refills may take up to 3 business days. We ask that you follow-up with your pharmacy.

## 2022-04-22 ENCOUNTER — Other Ambulatory Visit: Payer: Self-pay | Admitting: Nurse Practitioner

## 2022-04-23 NOTE — Telephone Encounter (Signed)
Requested medication (s) are due for refill today: expired medication  Requested medication (s) are on the active medication list: yes  Last refill:  02/12/21 #90 4 refills  Future visit scheduled: yes in 1 week  Notes to clinic:  expired medication . Do you want to renew rx?     Requested Prescriptions  Pending Prescriptions Disp Refills   esomeprazole (NEXIUM) 40 MG capsule [Pharmacy Med Name: ESOMEPRAZOLE MAGNESIUM 40MG  DR CAPS] 90 capsule 4    Sig: TAKE 1 CAPSULE(40 MG) BY MOUTH DAILY     Gastroenterology: Proton Pump Inhibitors 2 Passed - 04/22/2022 12:37 PM      Passed - ALT in normal range and within 360 days    ALT  Date Value Ref Range Status  09/25/2021 13 0 - 32 IU/L Final         Passed - AST in normal range and within 360 days    AST  Date Value Ref Range Status  09/25/2021 17 0 - 40 IU/L Final         Passed - Valid encounter within last 12 months    Recent Outpatient Visits           7 months ago Morbid obesity ([place])   [place], [place] T, NP   1 year ago Mixed hyperlipidemia   [place], [place] T, NP   1 year ago Encounter to establish care   [place], Barbaraann Faster, NP       Future Appointments             In 1 week [place], Barbaraann Faster, NP [employer]

## 2022-04-28 NOTE — Patient Instructions (Signed)

## 2022-04-30 ENCOUNTER — Ambulatory Visit: Payer: Medicare HMO | Admitting: Nurse Practitioner

## 2022-05-01 ENCOUNTER — Encounter: Payer: Self-pay | Admitting: Nurse Practitioner

## 2022-05-01 ENCOUNTER — Ambulatory Visit (INDEPENDENT_AMBULATORY_CARE_PROVIDER_SITE_OTHER): Payer: Medicare HMO | Admitting: Nurse Practitioner

## 2022-05-01 DIAGNOSIS — E782 Mixed hyperlipidemia: Secondary | ICD-10-CM | POA: Diagnosis not present

## 2022-05-01 DIAGNOSIS — E559 Vitamin D deficiency, unspecified: Secondary | ICD-10-CM | POA: Diagnosis not present

## 2022-05-01 DIAGNOSIS — K219 Gastro-esophageal reflux disease without esophagitis: Secondary | ICD-10-CM

## 2022-05-01 DIAGNOSIS — I890 Lymphedema, not elsewhere classified: Secondary | ICD-10-CM

## 2022-05-01 DIAGNOSIS — R030 Elevated blood-pressure reading, without diagnosis of hypertension: Secondary | ICD-10-CM

## 2022-05-01 DIAGNOSIS — R808 Other proteinuria: Secondary | ICD-10-CM

## 2022-05-01 DIAGNOSIS — E039 Hypothyroidism, unspecified: Secondary | ICD-10-CM | POA: Diagnosis not present

## 2022-05-01 DIAGNOSIS — M1A0710 Idiopathic chronic gout, right ankle and foot, without tophus (tophi): Secondary | ICD-10-CM

## 2022-05-01 DIAGNOSIS — R7309 Other abnormal glucose: Secondary | ICD-10-CM

## 2022-05-01 LAB — BAYER DCA HB A1C WAIVED: HB A1C (BAYER DCA - WAIVED): 6.8 % — ABNORMAL HIGH (ref 4.8–5.6)

## 2022-05-01 MED ORDER — ATORVASTATIN CALCIUM 20 MG PO TABS: 20.0000 mg | ORAL_TABLET | Freq: Every day | ORAL | 4 refills | Status: DC

## 2022-05-01 MED ORDER — BETAMETHASONE DIPROPIONATE AUG 0.05 % EX CREA: 1.0000 | TOPICAL_CREAM | Freq: Two times a day (BID) | CUTANEOUS | 4 refills | Status: DC | PRN

## 2022-05-01 MED ORDER — TORSEMIDE 100 MG PO TABS: 100.0000 mg | ORAL_TABLET | Freq: Every day | ORAL | 4 refills | Status: DC

## 2022-05-01 MED ORDER — LEVOTHYROXINE SODIUM 75 MCG PO TABS: 75.0000 ug | ORAL_TABLET | Freq: Every day | ORAL | 4 refills | Status: DC

## 2022-05-01 MED ORDER — GABAPENTIN 100 MG PO CAPS: 100.0000 mg | ORAL_CAPSULE | Freq: Every day | ORAL | 4 refills | Status: DC

## 2022-05-01 MED ORDER — ERGOCALCIFEROL 1.25 MG (50000 UT) PO CAPS: 50000.0000 [IU] | ORAL_CAPSULE | ORAL | 4 refills | Status: DC

## 2022-05-01 NOTE — Assessment & Plan Note (Signed)
BMI 51.76.  Recommended eating smaller high protein, low fat meals more frequently and exercising 30 mins a day 5 times a week with a goal of 10-15lb weight loss in the next 3 months. Patient voiced their understanding and motivation to adhere to these recommendations.  

## 2022-05-01 NOTE — Assessment & Plan Note (Signed)
Chronic, ongoing.  Continue current medication regimen and adjust as needed.  Lipid panel and CMP today.    

## 2022-05-01 NOTE — Assessment & Plan Note (Signed)
Chronic, ongoing.  Denies any current pain.  Recheck uric acid level today.  She has not tried Allopurinol, but would benefit from this.  Discussed at length with her.

## 2022-05-01 NOTE — Progress Notes (Signed)
BP 132/75   Pulse 84   Temp (!) 97.4 F (36.3 C) (Oral)   Ht 5\' 3"  (1.6 m)   Wt 292 lb 3.2 oz (132.5 kg)   SpO2 98%   BMI 51.76 kg/m    Subjective:    Patient ID: , female    DOB: [birth year], 71 y.o.   MRN: [id number]  HPI: Michelle Marks is a 71 y.o. female  Chief Complaint  Patient presents with   Hyperlipidemia   Hypertension   Hypothyroidism   Gastroesophageal Reflux   HYPERTENSION / HYPERLIPIDEMIA Continues on Torsemide for lymphedema and BP and Atorvastatin. She has tried compression hose without much benefit and struggles continually getting these on and Tylenol does not always offer benefit.  Had a fall on a spill in kitchen about 6 weeks ago, she reports landing on her right hip.  No injuries.    A1c 6.2% in April, is working on diet. Satisfied with current treatment? yes Duration of hypertension: chronic BP monitoring frequency: not  checking BP range:  BP medication side effects: no Duration of hyperlipidemia: chronic Cholesterol medication side effects: no Cholesterol supplements: none Medication compliance: good compliance Aspirin: no Recent stressors: no Recurrent headaches: no Visual changes: no Palpitations: no Dyspnea: no Chest pain: no Lower extremity edema: yes Dizzy/lightheaded: no     GERD Takes Nexium daily due to hiatal hernia. GERD control status: stable Satisfied with current treatment? yes Heartburn frequency: varies Medication side effects: no  Medication compliance: fluctuating Previous GERD medications: Protonix Antacid use frequency:  Mylanta if needed Dysphagia: no Odynophagia:  no Hematemesis: no Blood in stool: no EGD: no   GOUTY ARTHRITIS & LYMPHEDEMA No current medications for this, last uric acid level was 8.1 on 09/25/21.  She declined medications, was offered Allopurinol -- has never taken.  At night is hard to stay asleep due to discomfort at baseline in joints.    History of elevation in Rf on  review past labs and saw rheumatology on 08/21/20 = no signs of RA.  They offered Allopurinol.  Recent labs showed negative ANA.  Saw vascular last in August 2023 for lymphedema, continues on diuretic and wraps.   Has psoriasis, has had since in her 39's.  Does not want to take medication for this.  Goes to dermatology, Dr. 31's.  Needs refill on ointment. Duration:chronic Right 1st metatarsophalangeal pain: no Left 1st metatarsophalangeal pain: in past Right knee pain: no Left knee pain: no Swelling: no Redness: no Trauma: no Recent dietary change or indiscretion: no Fevers: no Nausea/vomiting: no Aggravating factors nothing Alleviating factors: nothing Status:  better Treatments attempted: nothing   HYPOTHYROIDISM Continues on Levothyroxine 75 MCG daily.   Thyroid control status:controlled Satisfied with current treatment? yes Medication side effects: no Medication compliance: good compliance Etiology of hypothyroidism:  Recent dose adjustment:no Fatigue: no Cold intolerance: no Heat intolerance: no Weight gain: no Weight loss: no Constipation: no Diarrhea/loose stools: no Palpitations: no Lower extremity edema: no Anxiety/depressed mood: no  Relevant past medical, surgical, family and social history reviewed and updated as indicated. Interim medical history since our last visit reviewed. Allergies and medications reviewed and updated.  Review of Systems  Constitutional:  Negative for activity change, appetite change, diaphoresis, fatigue and fever.  Respiratory:  Negative for cough, chest tightness and shortness of breath.   Cardiovascular:  Positive for leg swelling. Negative for chest pain and palpitations.  Gastrointestinal: Negative.   Endocrine: Negative for cold intolerance, heat intolerance, polydipsia, polyphagia  and polyuria.  Musculoskeletal:  Positive for arthralgias.  Neurological: Negative.   Psychiatric/Behavioral: Negative.     Per HPI unless  specifically indicated above     Objective:    BP 132/75   Pulse 84   Temp (!) 97.4 F (36.3 C) (Oral)   Ht 5\' 3"  (1.6 m)   Wt 292 lb 3.2 oz (132.5 kg)   SpO2 98%   BMI 51.76 kg/m   Wt Readings from Last 3 Encounters:  05/01/22 292 lb 3.2 oz (132.5 kg)  01/16/22 295 lb 12.8 oz (134.2 kg)  11/21/21 294 lb (133.4 kg)    Physical Exam Vitals and nursing note reviewed.  Constitutional:      General: She is awake. She is not in acute distress.    Appearance: She is well-developed and well-groomed. She is obese. She is not ill-appearing or toxic-appearing.  HENT:     Head: Normocephalic.     Right Ear: Hearing normal.     Left Ear: Hearing normal.  Eyes:     General: Lids are normal.        Right eye: No discharge.        Left eye: No discharge.     Conjunctiva/sclera: Conjunctivae normal.     Pupils: Pupils are equal, round, and reactive to light.  Neck:     Thyroid: No thyromegaly.     Vascular: No carotid bruit.  Cardiovascular:     Rate and Rhythm: Normal rate and regular rhythm.     Heart sounds: Normal heart sounds. No murmur heard.    No gallop.  Pulmonary:     Effort: Pulmonary effort is normal. No accessory muscle usage or respiratory distress.     Breath sounds: Normal breath sounds.  Abdominal:     General: Bowel sounds are normal.     Palpations: Abdomen is soft. There is no hepatomegaly.     Tenderness: There is no abdominal tenderness.  Musculoskeletal:     Cervical back: Normal range of motion and neck supple.     Right lower leg: 2+ Edema present.     Left lower leg: 2+ Edema present.  Skin:    General: Skin is warm and dry.  Neurological:     Mental Status: She is alert and oriented to person, place, and time.  Psychiatric:        Attention and Perception: Attention normal.        Mood and Affect: Mood normal.        Speech: Speech normal.        Behavior: Behavior normal. Behavior is cooperative.        Thought Content: Thought content normal.     Results for orders placed or performed in visit on 05/01/22  Bayer DCA Hb A1c Waived  Result Value Ref Range   HB A1C (BAYER DCA - WAIVED) 6.8 (H) 4.8 - 5.6 %      Assessment & Plan:   Problem List Items Addressed This Visit       Cardiovascular and Mediastinum   White coat syndrome without diagnosis of hypertension    Chronic, stable.  BP at goal in office today.  Recommend she monitor BP at least a few mornings a week at home and document.  DASH diet at home.  Continue current medication regimen and adjust as needed.  Labs today: CMP and lipid.  Return in 6 months.       Relevant Medications   atorvastatin (LIPITOR) 20 MG tablet  torsemide (DEMADEX) 100 MG tablet   Other Relevant Orders   Comprehensive metabolic panel     Digestive   GERD without esophagitis    Chronic, ongoing.  At this time continue Nexium is offering benefit.  She does have a hiatal hernia, discussed with her.  Mag level on labs today.        Relevant Orders   Magnesium     Endocrine   Hypothyroidism    Chronic, ongoing.  Continue current medication regimen and adjust regimen as needed.  Thyroid labs up to date.      Relevant Medications   levothyroxine (SYNTHROID) 75 MCG tablet     Musculoskeletal and Integument   Idiopathic chronic gout of right foot without tophus    Chronic, ongoing.  Denies any current pain.  Recheck uric acid level today.  She has not tried Allopurinol, but would benefit from this.  Discussed at length with her.      Relevant Orders   Comprehensive metabolic panel   Uric acid     Other   Elevated hemoglobin A1c measurement    A1c 6.8% today, in diabetic range.  Discussed at length with her.  She does not want to start medication, which to focus on diet reducing carbohydrate and sugar + increasing chair exercises.  Will trial this, if elevated next visit then consider a GLP1, which may offer benefit to weight and A1c.      Relevant Orders   Bayer DCA Hb A1c  Waived (Completed)   Microalbumin, Urine Waived   Lymphedema in adult patient    Chronic, ongoing.  Continue collaboration with vascular.  At this time continue Torsemide as ordered by previous provider, although limited evidence for this being beneficial in lymphedema. Recommend continue compression hose at home and take Tylenol 1000 MG TID as needed.        Relevant Orders   Comprehensive metabolic panel   Microalbumin, Urine Waived   Mixed hyperlipidemia    Chronic, ongoing.  Continue current medication regimen and adjust as needed.  Lipid panel and CMP today.        Relevant Medications   atorvastatin (LIPITOR) 20 MG tablet   torsemide (DEMADEX) 100 MG tablet   Other Relevant Orders   Comprehensive metabolic panel   Lipid Panel w/o Chol/HDL Ratio   Morbid obesity (HCC) - Primary    BMI 51.76.  Recommended eating smaller high protein, low fat meals more frequently and exercising 30 mins a day 5 times a week with a goal of 10-15lb weight loss in the next 3 months. Patient voiced their understanding and motivation to adhere to these recommendations.       Proteinuria    Urine ALB 80 on past visit, may benefit from lose dose ACE or ARB in future for this.  Recheck next visit, she will take sample home and bring back.      Relevant Orders   Microalbumin, Urine Waived   Vitamin D deficiency    Chronic, ongoing.  Continue weekly Vitamin D, refills sent in.  Check Vit D level today and recommend she obtain DEXA as ordered last visit.      Relevant Orders   VITAMIN D 25 Hydroxy (Vit-D Deficiency, Fractures)     Follow up plan: Return in about 6 months (around 10/30/2022) for HTN/HLD, LYMPHEDEMA, GERD, A1c Check, Vit D, Gout.

## 2022-05-01 NOTE — Assessment & Plan Note (Signed)
Chronic, stable.  BP at goal in office today.  Recommend she monitor BP at least a few mornings a week at home and document.  DASH diet at home.  Continue current medication regimen and adjust as needed.  Labs today: CMP and lipid.  Return in 6 months.

## 2022-05-01 NOTE — Assessment & Plan Note (Signed)
A1c 6.8% today, in diabetic range.  Discussed at length with her.  She does not want to start medication, which to focus on diet reducing carbohydrate and sugar + increasing chair exercises.  Will trial this, if elevated next visit then consider a GLP1, which may offer benefit to weight and A1c.

## 2022-05-01 NOTE — Assessment & Plan Note (Signed)
Urine ALB 80 on past visit, may benefit from lose dose ACE or ARB in future for this.  Recheck next visit, she will take sample home and bring back.

## 2022-05-01 NOTE — Assessment & Plan Note (Signed)
Chronic, ongoing.  Continue collaboration with vascular.  At this time continue Torsemide as ordered by previous provider, although limited evidence for this being beneficial in lymphedema. Recommend continue compression hose at home and take Tylenol 1000 MG TID as needed.

## 2022-05-01 NOTE — Assessment & Plan Note (Signed)
Chronic, ongoing.  Continue current medication regimen and adjust regimen as needed.  Thyroid labs up to date.

## 2022-05-01 NOTE — Assessment & Plan Note (Addendum)
Chronic, ongoing.  Continue weekly Vitamin D, refills sent in.  Check Vit D level today and recommend she obtain DEXA as ordered last visit.

## 2022-05-01 NOTE — Assessment & Plan Note (Signed)
Chronic, ongoing.  At this time continue Nexium is offering benefit.  She does have a hiatal hernia, discussed with her.  Mag level on labs today.

## 2022-05-02 LAB — LIPID PANEL W/O CHOL/HDL RATIO
Cholesterol, Total: 166 mg/dL (ref 100–199)
HDL: 56 mg/dL (ref >39)
LDL Chol Calc (NIH): 87 mg/dL (ref 0–99)
Triglycerides: 128 mg/dL (ref 0–149)
VLDL Cholesterol Cal: 23 mg/dL (ref 5–40)

## 2022-05-02 LAB — COMPREHENSIVE METABOLIC PANEL
ALT: 11 IU/L (ref 0–32)
AST: 17 IU/L (ref 0–40)
Albumin/Globulin Ratio: 1.8 (ref 1.2–2.2)
Albumin: 4.4 g/dL (ref 3.8–4.8)
Alkaline Phosphatase: 145 IU/L — ABNORMAL HIGH (ref 44–121)
BUN/Creatinine Ratio: 26 (ref 12–28)
BUN: 15 mg/dL (ref 8–27)
Bilirubin Total: 0.6 mg/dL (ref 0.0–1.2)
CO2: 27 mmol/L (ref 20–29)
Calcium: 9.6 mg/dL (ref 8.7–10.3)
Chloride: 98 mmol/L (ref 96–106)
Creatinine, Ser: 0.58 mg/dL (ref 0.57–1.00)
Globulin, Total: 2.5 g/dL (ref 1.5–4.5)
Glucose: 114 mg/dL — ABNORMAL HIGH (ref 70–99)
Potassium: 3.3 mmol/L — ABNORMAL LOW (ref 3.5–5.2)
Sodium: 144 mmol/L (ref 134–144)
Total Protein: 6.9 g/dL (ref 6.0–8.5)
eGFR: 97 mL/min/{1.73_m2} (ref >59)

## 2022-05-02 LAB — MAGNESIUM: Magnesium: 1.9 mg/dL (ref 1.6–2.3)

## 2022-05-02 LAB — VITAMIN D 25 HYDROXY (VIT D DEFICIENCY, FRACTURES): Vit D, 25-Hydroxy: 28.4 ng/mL — ABNORMAL LOW (ref 30.0–100.0)

## 2022-05-02 LAB — URIC ACID: Uric Acid: 8.4 mg/dL — ABNORMAL HIGH (ref 3.1–7.9)

## 2022-05-02 NOTE — Progress Notes (Signed)
Contacted via [place] morning Jeneva, your labs have returned: - Kidney function, creatinine and eGFR, remains normal, as is liver function, AST and ALT. Alkaline phosphatase which sometimes looks at gall bladder or bone, remains mildly elevated.  We will continue to watch this.  Do you still have gall bladder? - Potassium a little low this check, please add some potassium rich food to diet like mangoes, dried fruit, avocado, winter squash, broccoli, and sweet potatoes.  We will recheck next visit and if remains low may need to start supplement. - Uric acid level remains elevated, I do recommend you try the Allopurinol as it will help lower levels and may help pain. - Vitamin D a little low, please ensure you are taking Vitamin D3 2000 units daily. - Cholesterol labs stable, no medication changes needed.  Any questions? Keep being amazing!!  Thank you for allowing me to participate in your care.  I appreciate you. Kindest regards, Ky Rumple

## 2022-05-27 ENCOUNTER — Ambulatory Visit: Payer: Self-pay

## 2022-05-27 NOTE — Telephone Encounter (Signed)
Message from Aretta Nip sent at 05/27/2022 10:36 AM EST  Summary: Making sure of med reaction   Disp Refills Start End gabapentin (NEURONTIN) 100 MG capsule Pls FU [phone] Pt wants to make sure no drug interaction with taking Aleve with this medication        Per [facility] no interaction with Gabapentin and Aleve. Called pt and advised her of same. Reason for Disposition  Caller has medicine question only, adult not sick, AND triager answers question  Answer Assessment - Initial Assessment Questions 1. NAME of MEDICINE: "What medicine(s) are you calling about?"     Aleve, Gabapentin 2. QUESTION: "What is your question?" (e.g., double dose of medicine, side effect)     Any drug interactions 3. PRESCRIBER: "Who prescribed the medicine?" Reason: if prescribed by specialist, call should be referred to that group.     PCP 4. SYMPTOMS: "Do you have any symptoms?" If Yes, ask: "What symptoms are you having?"  "How bad are the symptoms (e.g., mild, moderate, severe)     No sx 5. PREGNANCY:  "Is there any chance that you are pregnant?" "When was your last menstrual period?"     N/a  Protocols used: Medication Question Call-A-AH

## 2022-05-31 ENCOUNTER — Ambulatory Visit: Payer: Self-pay

## 2022-05-31 NOTE — Telephone Encounter (Signed)
Message from Kandis Cocking sent at 05/31/2022  2:39 PM EST  Summary: Gabapentin not working   The patient called in stating her gabapentin (NEURONTIN) 100 MG capsule is not working. She even got up to taking 3 a day at night and still no help. She is wondering what else she can do for her back pain and pain in her leg. Please assist patient further.        Called pt and LM on VM to call back to discuss.

## 2022-05-31 NOTE — Telephone Encounter (Signed)
  Chief Complaint: medication neurontin increased to 300 mg at night and is not helping with back pain . Do you recommend another medication? Symptoms: unable to sleep at night . Taking 300 mg neurontin at night as recommended. Back pain persists laying down better during the day when up and moving  Frequency: 3 nights Pertinent Negatives: Patient denies na  Disposition: [] ED /[] Urgent Care (no appt availability in office) / [] Appointment(In office/virtual)/ []  [facility]/ [] Home Care/ [] Refused Recommended Disposition /[] [facility]/ [x]  Follow-up with PCP Additional Notes:   Please advise if another medication recommended prior to weekend?  Reason for Disposition  [1] Caller has NON-URGENT medicine question about med that PCP prescribed AND [2] triager unable to answer question  Answer Assessment - Initial Assessment Questions 1. NAME of MEDICINE: "What medicine(s) are you calling about?"     Neurontin 100 mg  2. QUESTION: "What is your question?" (e.g., double dose of medicine, side effect)     Neurontin 300 mg taken at hs and still not helping with pain and unable to sleep. Do you want to change to a different medication? 3. PRESCRIBER: "Who prescribed the medicine?" Reason: if prescribed by specialist, call should be referred to that group.     PCP 4. SYMPTOMS: "Do you have any symptoms?" If Yes, ask: "What symptoms are you having?"  "How bad are the symptoms (e.g., mild, moderate, severe)     Pain in back not better at night  5. PREGNANCY:  "Is there any chance that you are pregnant?" "When was your last menstrual period?"     na  Protocols used: Medication Question Call-A-AH

## 2022-06-03 NOTE — Telephone Encounter (Signed)
Pt states she will call back to schedule another appointment

## 2022-07-22 ENCOUNTER — Ambulatory Visit (INDEPENDENT_AMBULATORY_CARE_PROVIDER_SITE_OTHER): Payer: Medicare HMO

## 2022-07-22 VITALS — Ht 63.0 in | Wt 292.0 lb

## 2022-07-22 DIAGNOSIS — Z0000 Encounter for general adult medical examination without abnormal findings: Secondary | ICD-10-CM

## 2022-07-22 NOTE — Patient Instructions (Signed)
Michelle Marks , Thank you for taking time to come for your Medicare Wellness Visit. I appreciate your ongoing commitment to your health goals. Please review the following plan we discussed and let me know if I can assist you in the future.   These are the goals we discussed:  Goals      DIET - EAT MORE FRUITS AND VEGETABLES     Weight (lb) < 200 lb (90.7 kg)        This is a list of the screening recommended for you and due dates:  Health Maintenance  Topic Date Due   DTaP/Tdap/Td vaccine (1 - Tdap) Never done   Zoster (Shingles) Vaccine (1 of 2) Never done   DEXA scan (bone density measurement)  Never done   COVID-19 Vaccine (5 - 2023-24 season) 02/15/2022   Mammogram  07/10/2022   Flu Shot  09/15/2022*   Pneumonia Vaccine (1 - PCV) 09/26/2022*   Yearly kidney health urinalysis for diabetes  09/26/2022   Hemoglobin A1C  10/30/2022   Yearly kidney function blood test for diabetes  05/02/2023   Medicare Annual Wellness Visit  07/23/2023   Cologuard (Stool DNA test)  10/09/2024   Hepatitis C Screening: USPSTF Recommendation to screen - Ages 18-79 yo.  Completed   HPV Vaccine  Aged Out   Complete foot exam   Discontinued   Eye exam for diabetics  Discontinued  *Topic was postponed. The date shown is not the original due date.    Advanced directives: no  Conditions/risks identified: none  Next appointment: Follow up in one year for your annual wellness visit 07/28/23 @ 2:30 pm by phone   Preventive Care 65 Years and Older, Female Preventive care refers to lifestyle choices and visits with your health care provider that can promote health and wellness. What does preventive care include? A yearly physical exam. This is also called an annual well check. Dental exams once or twice a year. Routine eye exams. Ask your health care provider how often you should have your eyes checked. Personal lifestyle choices, including: Daily care of your teeth and gums. Regular physical  activity. Eating a healthy diet. Avoiding tobacco and drug use. Limiting alcohol use. Practicing safe sex. Taking low-dose aspirin every day. Taking vitamin and mineral supplements as recommended by your health care provider. What happens during an annual well check? The services and screenings done by your health care provider during your annual well check will depend on your age, overall health, lifestyle risk factors, and family history of disease. Counseling  Your health care provider may ask you questions about your: Alcohol use. Tobacco use. Drug use. Emotional well-being. Home and relationship well-being. Sexual activity. Eating habits. History of falls. Memory and ability to understand (cognition). Work and work Statistician. Reproductive health. Screening  You may have the following tests or measurements: Height, weight, and BMI. Blood pressure. Lipid and cholesterol levels. These may be checked every 5 years, or more frequently if you are over 15 years old. Skin check. Lung cancer screening. You may have this screening every year starting at age 58 if you have a 30-pack-year history of smoking and currently smoke or have quit within the past 15 years. Fecal occult blood test (FOBT) of the stool. You may have this test every year starting at age 23. Flexible sigmoidoscopy or colonoscopy. You may have a sigmoidoscopy every 5 years or a colonoscopy every 10 years starting at age 1. Hepatitis C blood test. Hepatitis B blood test. Sexually  transmitted disease (STD) testing. Diabetes screening. This is done by checking your blood sugar (glucose) after you have not eaten for a while (fasting). You may have this done every 1-3 years. Bone density scan. This is done to screen for osteoporosis. You may have this done starting at age 76. Mammogram. This may be done every 1-2 years. Talk to your health care provider about how often you should have regular mammograms. Talk with your  health care provider about your test results, treatment options, and if necessary, the need for more tests. Vaccines  Your health care provider may recommend certain vaccines, such as: Influenza vaccine. This is recommended every year. Tetanus, diphtheria, and acellular pertussis (Tdap, Td) vaccine. You may need a Td booster every 10 years. Zoster vaccine. You may need this after age 40. Pneumococcal 13-valent conjugate (PCV13) vaccine. One dose is recommended after age 68. Pneumococcal polysaccharide (PPSV23) vaccine. One dose is recommended after age 63. Talk to your health care provider about which screenings and vaccines you need and how often you need them. This information is not intended to replace advice given to you by your health care provider. Make sure you discuss any questions you have with your health care provider. Document Released: 06/30/2015 Document Revised: 02/21/2016 Document Reviewed: 04/04/2015 Elsevier Interactive Patient Education  2017 [employer] Prevention in the Home Falls can cause injuries. They can happen to people of all ages. There are many things you can do to make your home safe and to help prevent falls. What can I do on the outside of my home? Regularly fix the edges of walkways and driveways and fix any cracks. Remove anything that might make you trip as you walk through a door, such as a raised step or threshold. Trim any bushes or trees on the path to your home. Use bright outdoor lighting. Clear any walking paths of anything that might make someone trip, such as rocks or tools. Regularly check to see if handrails are loose or broken. Make sure that both sides of any steps have handrails. Any raised decks and porches should have guardrails on the edges. Have any leaves, snow, or ice cleared regularly. Use sand or salt on walking paths during winter. Clean up any spills in your garage right away. This includes oil or grease spills. What can I  do in the bathroom? Use night lights. Install grab bars by the toilet and in the tub and shower. Do not use towel bars as grab bars. Use non-skid mats or decals in the tub or shower. If you need to sit down in the shower, use a plastic, non-slip stool. Keep the floor dry. Clean up any water that spills on the floor as soon as it happens. Remove soap buildup in the tub or shower regularly. Attach bath mats securely with double-sided non-slip rug tape. Do not have throw rugs and other things on the floor that can make you trip. What can I do in the bedroom? Use night lights. Make sure that you have a light by your bed that is easy to reach. Do not use any sheets or blankets that are too big for your bed. They should not hang down onto the floor. Have a firm chair that has side arms. You can use this for support while you get dressed. Do not have throw rugs and other things on the floor that can make you trip. What can I do in the kitchen? Clean up any spills right away. Avoid  walking on wet floors. Keep items that you use a lot in easy-to-reach places. If you need to reach something above you, use a strong step stool that has a grab bar. Keep electrical cords out of the way. Do not use floor polish or wax that makes floors slippery. If you must use wax, use non-skid floor wax. Do not have throw rugs and other things on the floor that can make you trip. What can I do with my stairs? Do not leave any items on the stairs. Make sure that there are handrails on both sides of the stairs and use them. Fix handrails that are broken or loose. Make sure that handrails are as long as the stairways. Check any carpeting to make sure that it is firmly attached to the stairs. Fix any carpet that is loose or worn. Avoid having throw rugs at the top or bottom of the stairs. If you do have throw rugs, attach them to the floor with carpet tape. Make sure that you have a light switch at the top of the stairs  and the bottom of the stairs. If you do not have them, ask someone to add them for you. What else can I do to help prevent falls? Wear shoes that: Do not have high heels. Have rubber bottoms. Are comfortable and fit you well. Are closed at the toe. Do not wear sandals. If you use a stepladder: Make sure that it is fully opened. Do not climb a closed stepladder. Make sure that both sides of the stepladder are locked into place. Ask someone to hold it for you, if possible. Clearly mark and make sure that you can see: Any grab bars or handrails. First and last steps. Where the edge of each step is. Use tools that help you move around (mobility aids) if they are needed. These include: Canes. Walkers. Scooters. Crutches. Turn on the lights when you go into a dark area. Replace any light bulbs as soon as they burn out. Set up your furniture so you have a clear path. Avoid moving your furniture around. If any of your floors are uneven, fix them. If there are any pets around you, be aware of where they are. Review your medicines with your doctor. Some medicines can make you feel dizzy. This can increase your chance of falling. Ask your doctor what other things that you can do to help prevent falls. This information is not intended to replace advice given to you by your health care provider. Make sure you discuss any questions you have with your health care provider. Document Released: 03/30/2009 Document Revised: 11/09/2015 Document Reviewed: 07/08/2014 Elsevier Interactive Patient Education  2017 [employer].

## 2022-07-22 NOTE — Progress Notes (Signed)
Virtual Visit via Telephone Note  I connected with  Michelle Marks on 07/22/22 at  2:45 PM EST by telephone and verified that I am speaking with the correct person using two identifiers.  Location: Patient: home Provider: [facility] Persons participating in the virtual visit: patient/Nurse Health Advisor   I discussed the limitations, risks, security and privacy concerns of performing an evaluation and management service by telephone and the availability of in person appointments. The patient expressed understanding and agreed to proceed.  Interactive audio and video telecommunications were attempted between this nurse and patient, however failed, due to patient having technical difficulties OR patient did not have access to video capability.  We continued and completed visit with audio only.  Some vital signs may be absent or patient reported.   Hal Hope, LPN  Subjective:   Michelle Marks is a 72 y.o. female who presents for Medicare Annual (Subsequent) preventive examination.  Review of Systems     Cardiac Risk Factors include: advanced age (>88men, >80 women);hypertension;obesity (BMI >30kg/m2);sedentary lifestyle     Objective:    Today's Vitals   07/22/22 1501  Weight: 292 lb (132.5 kg)  Height: 5\' 3"  (1.6 m)   Body mass index is 51.73 kg/m.     07/22/2022    2:49 PM 05/29/2021   12:05 PM 12/02/2016    8:56 AM 11/26/2016    3:18 PM  Advanced Directives  Does Patient Have a Medical Advance Directive? No Yes No No  Type of Advance Directive  Living will    Would patient like information on creating a medical advance directive? No - Patient declined  No - Patient declined No - Patient declined    Current Medications (verified) Outpatient Encounter Medications as of 07/22/2022  Medication Sig   Accu-Chek FastClix Lancets MISC 1 each by Does not apply route daily before breakfast.   atorvastatin (LIPITOR) 20 MG tablet Take 1 tablet (20 mg total) by mouth daily.    augmented betamethasone dipropionate (DIPROLENE-AF) 0.05 % cream Apply 1 Application topically 2 (two) times daily as needed (to affected skin areas).   ergocalciferol (VITAMIN D2) 1.25 MG (50000 UT) capsule Take 1 capsule (50,000 Units total) by mouth once a week.   esomeprazole (NEXIUM) 40 MG capsule TAKE 1 CAPSULE(40 MG) BY MOUTH DAILY   glucose blood test strip To check blood sugar daily prior to eating in morning, with goal less the 130.   levothyroxine (SYNTHROID) 75 MCG tablet Take 1 tablet (75 mcg total) by mouth daily before breakfast.   torsemide (DEMADEX) 100 MG tablet Take 1 tablet (100 mg total) by mouth daily.   [DISCONTINUED] gabapentin (NEURONTIN) 100 MG capsule Take 1 capsule (100 mg total) by mouth at bedtime. May increase to total of 300 MG at night by mouth (3 tablets), do not take more then this without alerting provider. (Patient not taking: Reported on 07/22/2022)   No facility-administered encounter medications on file as of 07/22/2022.    Allergies (verified) Sulfa antibiotics   History: Past Medical History:  Diagnosis Date   Anxiety    Arthritis    Difficult intubation    Edema, leg    Endometrial polyp    s/p Hysteroscopy D&C with polypectomy in 11/2016   GERD (gastroesophageal reflux disease)    Hyperlipidemia    Morbid obesity (HCC) 10/24/2016   Past Surgical History:  Procedure Laterality Date   BREAST CYST EXCISION Right age 4   benign   BREAST SURGERY Right  Cyst Removal   CERVICAL POLYPECTOMY N/A 12/02/2016   Procedure: CERVICAL POLYPECTOMY;  Surgeon: Rubie Maid, MD;  Location: [facility] ORS;  Service: Gynecology;  Laterality: N/A;   DILATION AND CURETTAGE OF UTERUS     HYSTEROSCOPY     HYSTEROSCOPY WITH D & C N/A 12/02/2016   Procedure: DILATATION AND CURETTAGE /HYSTEROSCOPY;  Surgeon: Rubie Maid, MD;  Location: [facility] ORS;  Service: Gynecology;  Laterality: N/A;   JOINT REPLACEMENT Left    Total Knee Replacement, Dr. Marry Guan, [facility]   KNEE  ARTHROSCOPY Left    TONSILLECTOMY     as a child   Family History  Problem Relation Age of Onset   Cancer Mother    Diabetes Mother    Hypertension Mother    Diabetes Father    Pancreatitis Father    Cancer Sister    Social History   Socioeconomic History   Marital status: Single    Spouse name: Not on file   Number of children: Not on file   Years of education: Not on file   Highest education level: Not on file  Occupational History   Not on file  Tobacco Use   Smoking status: Former    Types: Cigarettes    Quit date: 09/16/2003    Years since quitting: 18.8   Smokeless tobacco: Never  Vaping Use   Vaping Use: Never used  Substance and Sexual Activity   Alcohol use: Yes    Comment: occas   Drug use: No   Sexual activity: Not Currently    Birth control/protection: None  Other Topics Concern   Not on file  Social History Narrative   Not on file   Social Determinants of Health   Financial Resource Strain: Low Risk  (07/22/2022)   Overall Financial Resource Strain (CARDIA)    Difficulty of Paying Living Expenses: Not very hard  Food Insecurity: No Food Insecurity (07/22/2022)   Hunger Vital Sign    Worried About Running Out of Food in the Last Year: Never true    [place] in the Last Year: Never true  Transportation Needs: No Transportation Needs (07/22/2022)   PRAPARE - Hydrologist (Medical): No    Lack of Transportation (Non-Medical): No  Physical Activity: Insufficiently Active (07/22/2022)   Exercise Vital Sign    Days of Exercise per Week: 2 days    Minutes of Exercise per Session: 20 min  Stress: No Stress Concern Present (07/22/2022)   [place]    Feeling of Stress : Not at all  Social Connections: Socially Isolated (07/22/2022)   Social Connection and Isolation Panel [NHANES]    Frequency of Communication with Friends and Family: More than three times a week     Frequency of Social Gatherings with Friends and Family: Twice a week    Attends Religious Services: Never    Marine scientist or Organizations: No    Attends Music therapist: Never    Marital Status: Never married    Tobacco Counseling Counseling given: Not Answered   Clinical Intake:  Pre-visit preparation completed: Yes  Pain : No/denies pain     Nutritional Risks: None Diabetes: No  How often do you need to have someone help you when you read instructions, pamphlets, or other written materials from your doctor or pharmacy?: 1 - Never  Diabetic?no  Interpreter Needed?: No  Information entered by :: Kirke Shaggy, LPN  Activities of Daily Living    07/22/2022    2:51 PM  In your present state of health, do you have any difficulty performing the following activities:  Hearing? 0  Vision? 0  Difficulty concentrating or making decisions? 0  Walking or climbing stairs? 1  Dressing or bathing? 0  Doing errands, shopping? 0  Preparing Food and eating ? N  Using the Toilet? N  In the past six months, have you accidently leaked urine? N  Do you have problems with loss of bowel control? N  Managing your Medications? N  Managing your Finances? N  Housekeeping or managing your Housekeeping? Y    Patient Care Team: Marjie Skiff, NP as PCP - General (Nurse Practitioner)  Indicate any recent Medical Services you may have received from other than [facility] providers in the past year (date may be approximate).     Assessment:   This is a routine wellness examination for [place].  Hearing/Vision screen Hearing Screening - Comments:: No aids Vision Screening - Comments:: Wears glasses- Dr.Woodard   Dietary issues and exercise activities discussed: Current Exercise Habits: Home exercise routine, Type of exercise: walking, Time (Minutes): 20, Frequency (Times/Week): 2, Weekly Exercise (Minutes/Week): 40, Intensity: Mild   Goals Addressed              This Visit's Progress    DIET - EAT MORE FRUITS AND VEGETABLES         Depression Screen    07/22/2022    2:46 PM 05/01/2022    1:46 PM 09/25/2021    1:12 PM 05/29/2021   12:23 PM 02/12/2021    1:24 PM  PHQ 2/9 Scores  PHQ - 2 Score 0 1 0 0 0  PHQ- 9 Score 0 1 2      Fall Risk    07/22/2022    2:49 PM 05/01/2022    1:45 PM 05/29/2021   12:12 PM 03/27/2021    1:08 PM  Fall Risk   Falls in the past year? 1 1 0 0  Number falls in past yr: 0 0 0 0  Injury with Fall? 0 1 0 0  Risk for fall due to : History of fall(s);Impaired mobility Impaired mobility;History of fall(s)  No Fall Risks  Follow up Falls prevention discussed;Falls evaluation completed Falls evaluation completed Falls evaluation completed;Education provided;Falls prevention discussed Falls evaluation completed    FALL RISK PREVENTION PERTAINING TO THE HOME:  Any stairs in or around the home? Yes  If so, are there any without handrails? No  Home free of loose throw rugs in walkways, pet beds, electrical cords, etc? No  Adequate lighting in your home to reduce risk of falls? No   ASSISTIVE DEVICES UTILIZED TO PREVENT FALLS:  Life alert? No  Use of a cane, walker or w/c? Yes  Grab bars in the bathroom? Yes  Shower chair or bench in shower? Yes  Elevated toilet seat or a handicapped toilet? No     Cognitive Function:        07/22/2022    2:56 PM  6CIT Screen  What Year? 0 points  What month? 0 points  What time? 0 points  Count back from 20 0 points  Months in reverse 0 points  Repeat phrase 2 points  Total Score 2 points    Immunizations Immunization History  Administered Date(s) Administered   PFIZER(Purple Top)SARS-COV-2 Vaccination 09/21/2019, 10/19/2019, 05/20/2020, 05/21/2021    TDAP status: Due, Education has been provided regarding the importance of  this vaccine. Advised may receive this vaccine at local pharmacy or Health Dept. Aware to provide a copy of the vaccination record if  obtained from local pharmacy or Health Dept. Verbalized acceptance and understanding.  Flu Vaccine status: Declined, Education has been provided regarding the importance of this vaccine but patient still declined. Advised may receive this vaccine at local pharmacy or Health Dept. Aware to provide a copy of the vaccination record if obtained from local pharmacy or Health Dept. Verbalized acceptance and understanding.  Pneumococcal vaccine status: Declined,  Education has been provided regarding the importance of this vaccine but patient still declined. Advised may receive this vaccine at local pharmacy or Health Dept. Aware to provide a copy of the vaccination record if obtained from local pharmacy or Health Dept. Verbalized acceptance and understanding.   Covid-19 vaccine status: Completed vaccines  Qualifies for Shingles Vaccine? Yes   Zostavax completed No   Shingrix Completed?: No.    Education has been provided regarding the importance of this vaccine. Patient has been advised to call insurance company to determine out of pocket expense if they have not yet received this vaccine. Advised may also receive vaccine at local pharmacy or Health Dept. Verbalized acceptance and understanding.  Screening Tests Health Maintenance  Topic Date Due   DTaP/Tdap/Td (1 - Tdap) Never done   Zoster Vaccines- Shingrix (1 of 2) Never done   DEXA SCAN  Never done   COVID-19 Vaccine (5 - 2023-24 season) 02/15/2022   MAMMOGRAM  07/10/2022   INFLUENZA VACCINE  09/15/2022 (Originally 01/15/2022)   Pneumonia Vaccine 60+ Years old (1 - PCV) 09/26/2022 (Originally 03/11/2016)   Diabetic kidney evaluation - Urine ACR  09/26/2022   HEMOGLOBIN A1C  10/30/2022   Diabetic kidney evaluation - eGFR measurement  05/02/2023   Medicare Annual Wellness (AWV)  07/23/2023   Fecal DNA (Cologuard)  10/09/2024   Hepatitis C Screening  Completed   HPV VACCINES  Aged Out   FOOT EXAM  Discontinued   OPHTHALMOLOGY EXAM   Discontinued    Health Maintenance  Health Maintenance Due  Topic Date Due   DTaP/Tdap/Td (1 - Tdap) Never done   Zoster Vaccines- Shingrix (1 of 2) Never done   DEXA SCAN  Never done   COVID-19 Vaccine (5 - 2023-24 season) 02/15/2022   MAMMOGRAM  07/10/2022    Colorectal cancer screening: Type of screening: Cologuard. Completed 10/09/21. Repeat every 3 years  Mammogram status: Completed 07/10/20. Repeat every year- will schedule  Bone Density status: Ordered 09/25/21. Pt provided with contact info and advised to call to schedule appt.  Lung Cancer Screening: (Low Dose CT Chest recommended if Age 78-80 years, 30 pack-year currently smoking OR have quit w/in 15years.) does not qualify.   Additional Screening:  Hepatitis C Screening: does qualify; Completed 09/25/21  Vision Screening: Recommended annual ophthalmology exams for early detection of glaucoma and other disorders of the eye. Is the patient up to date with their annual eye exam?  Yes  Who is the provider or what is the name of the office in which the patient attends annual eye exams? Dr.Woodard If pt is not established with a provider, would they like to be referred to a provider to establish care? No .   Dental Screening: Recommended annual dental exams for proper oral hygiene  Community Resource Referral / Chronic Care Management: CRR required this visit?  No   CCM required this visit?  No      Plan:  I have personally reviewed and noted the following in the patient's chart:   Medical and social history Use of alcohol, tobacco or illicit drugs  Current medications and supplements including opioid prescriptions. Patient is not currently taking opioid prescriptions. Functional ability and status Nutritional status Physical activity Advanced directives List of other physicians Hospitalizations, surgeries, and ER visits in previous 12 months Vitals Screenings to include cognitive, depression, and  falls Referrals and appointments  In addition, I have reviewed and discussed with patient certain preventive protocols, quality metrics, and best practice recommendations. A written personalized care plan for preventive services as well as general preventive health recommendations were provided to patient.     Dionisio David, LPN   1/0/2725   Nurse Notes: none

## 2022-07-23 ENCOUNTER — Ambulatory Visit (INDEPENDENT_AMBULATORY_CARE_PROVIDER_SITE_OTHER): Payer: Medicare HMO | Admitting: Nurse Practitioner

## 2022-07-23 VITALS — BP 147/71 | HR 78 | Ht 63.0 in | Wt 292.0 lb

## 2022-07-23 DIAGNOSIS — I890 Lymphedema, not elsewhere classified: Secondary | ICD-10-CM | POA: Diagnosis not present

## 2022-07-23 DIAGNOSIS — E782 Mixed hyperlipidemia: Secondary | ICD-10-CM | POA: Diagnosis not present

## 2022-07-31 ENCOUNTER — Telehealth: Payer: Self-pay | Admitting: Nurse Practitioner

## 2022-07-31 NOTE — Telephone Encounter (Signed)
FYI: Patient called in regard to insurance concerns. Her PCP is not listed on her insurance card. Another PCP is listed, but she dose not go to that provider. She would like to keep Michelle Cannady,NP as her PCP. She will call back if she has any problems.

## 2022-07-31 NOTE — Telephone Encounter (Signed)
Copied from [place]. Topic: General - Other >> Jul 31, 2022  3:10 PM Tiffany B wrote: As per Norvel Richards from [facility] states  please follow up with the [facility] credentialing line because Marnee Guarneri, NP, PCP credinatling has expired  therefore its reflecting Marnee Guarneri is not in network. PCP partial term as of 07/18/2022.  Reference #  [id number]  Caller did not know the credentialing line but suggested PA call the provider line.    Please advise patient the outcome because if the PCP is no longer in network the patient would have to be assigned to Dr. Wynetta Emery who is reflecting in network.

## 2022-08-05 ENCOUNTER — Encounter (INDEPENDENT_AMBULATORY_CARE_PROVIDER_SITE_OTHER): Payer: Self-pay | Admitting: Nurse Practitioner

## 2022-08-05 NOTE — Progress Notes (Signed)
Subjective:    Patient ID: Michelle Marks, female    DOB: 09-05-1950, 72 y.o.   MRN: [id number] Chief Complaint  Patient presents with   Follow-up    The patient returns to the office for followup evaluation regarding leg swelling.  The swelling has persisted and the pain associated with swelling continues. There have not been any interval development of a ulcerations or wounds.  Since the previous visit the patient has been wearing graduated compression stockings and has noted little if any improvement in the lymphedema. The patient has been using compression routinely morning until night.  The patient also states elevation during the day and exercise is being done too.      Review of Systems  Cardiovascular:  Positive for leg swelling.  All other systems reviewed and are negative.      Objective:   Physical Exam Vitals reviewed.  Cardiovascular:     Rate and Rhythm: Normal rate.  Pulmonary:     Effort: Pulmonary effort is normal.  Musculoskeletal:     Right lower leg: Edema present.     Left lower leg: Edema present.  Skin:    General: Skin is warm and dry.     Comments: hyperPigmentation  Neurological:     Mental Status: She is alert and oriented to person, place, and time.  Psychiatric:        Mood and Affect: Mood normal.        Behavior: Behavior normal.        Thought Content: Thought content normal.        Judgment: Judgment normal.     BP (!) 147/71   Pulse 78   Ht 5' 3"$  (1.6 m)   Wt 292 lb (132.5 kg)   BMI 51.73 kg/m   Past Medical History:  Diagnosis Date   Anxiety    Arthritis    Difficult intubation    Edema, leg    Endometrial polyp    s/p Hysteroscopy D&C with polypectomy in 11/2016   GERD (gastroesophageal reflux disease)    Hyperlipidemia    Morbid obesity ([place]) 10/24/2016    Social History   Socioeconomic History   Marital status: Single    Spouse name: Not on file   Number of children: Not on file   Years of education: Not on  file   Highest education level: Not on file  Occupational History   Not on file  Tobacco Use   Smoking status: Former    Types: Cigarettes    Quit date: 09/16/2003    Years since quitting: 18.8   Smokeless tobacco: Never  Vaping Use   Vaping Use: Never used  Substance and Sexual Activity   Alcohol use: Yes    Comment: occas   Drug use: No   Sexual activity: Not Currently    Birth control/protection: None  Other Topics Concern   Not on file  Social History Narrative   Not on file   Social Determinants of Health   Financial Resource Strain: Low Risk  (07/22/2022)   Overall Financial Resource Strain (CARDIA)    Difficulty of Paying Living Expenses: Not very hard  Food Insecurity: No Food Insecurity (07/22/2022)   Hunger Vital Sign    Worried About Running Out of Food in the Last Year: Never true    Ran Out of Food in the Last Year: Never true  Transportation Needs: No Transportation Needs (07/22/2022)   PRAPARE - Hydrologist (Medical):  No    Lack of Transportation (Non-Medical): No  Physical Activity: Insufficiently Active (07/22/2022)   Exercise Vital Sign    Days of Exercise per Week: 2 days    Minutes of Exercise per Session: 20 min  Stress: No Stress Concern Present (07/22/2022)   [place]    Feeling of Stress : Not at all  Social Connections: Socially Isolated (07/22/2022)   Social Connection and Isolation Panel [NHANES]    Frequency of Communication with Friends and Family: More than three times a week    Frequency of Social Gatherings with Friends and Family: Twice a week    Attends Religious Services: Never    Marine scientist or Organizations: No    Attends Archivist Meetings: Never    Marital Status: Never married  Intimate Partner Violence: Not At Risk (07/22/2022)   Humiliation, Afraid, Rape, and Kick questionnaire    Fear of Current or Ex-Partner: No     Emotionally Abused: No    Physically Abused: No    Sexually Abused: No    Past Surgical History:  Procedure Laterality Date   BREAST CYST EXCISION Right age 43   benign   BREAST SURGERY Right    Cyst Removal   CERVICAL POLYPECTOMY N/A 12/02/2016   Procedure: CERVICAL POLYPECTOMY;  Surgeon: Rubie Maid, MD;  Location: [facility] ORS;  Service: Gynecology;  Laterality: N/A;   DILATION AND CURETTAGE OF UTERUS     HYSTEROSCOPY     HYSTEROSCOPY WITH D & C N/A 12/02/2016   Procedure: DILATATION AND CURETTAGE /HYSTEROSCOPY;  Surgeon: Rubie Maid, MD;  Location: [facility] OR[facility];  Service: Gynecology;  Laterality: N/A;   JOINT REPLACEMENT Left    Total Knee Replacement, Dr. Marry Guan, [facility]   KNEE ARTHROSCOPY Left    TONSILLECTOMY     as a child    Family History  Problem Relation Age of Onset   Cancer Mother    Diabetes Mother    Hypertension Mother    Diabetes Father    Pancreatitis Father    Cancer Sister     Allergies  Allergen Reactions   Sulfa Antibiotics Nausea Only       Latest Ref Rng & Units 09/25/2021    1:53 PM 02/12/2021    2:19 PM 12/02/2016    9:30 AM  CBC  WBC 3.4 - 10.8 x10E3/uL 6.5  6.4    Hemoglobin 11.1 - 15.9 g/dL 14.8  14.8  13.6   Hematocrit 34.0 - 46.6 % 43.7  46.3  40.0   Platelets 150 - 450 x10E3/uL 257  242        CMP     Component Value Date/Time   NA 144 05/01/2022 1543   K 3.3 (L) 05/01/2022 1543   CL 98 05/01/2022 1543   CO2 27 05/01/2022 1543   GLUCOSE 114 (H) 05/01/2022 1543   GLUCOSE 125 (H) 12/02/2016 0930   BUN 15 05/01/2022 1543   CREATININE 0.58 05/01/2022 1543   CALCIUM 9.6 05/01/2022 1543   PROT 6.9 05/01/2022 1543   ALBUMIN 4.4 05/01/2022 1543   AST 17 05/01/2022 1543   ALT 11 05/01/2022 1543   ALKPHOS 145 (H) 05/01/2022 1543   BILITOT 0.6 05/01/2022 1543     No results found.     Assessment & Plan:   1. Lymphedema in adult patient Recommend:  No surgery or intervention at this point in time.   The Patient is CEAP  [id number].  The patient has been wearing compression for more than 12 weeks with no or little benefit.  The patient has been exercising daily for more than 12 weeks. The patient has been elevating and taking OTC pain medications for more than 12 weeks.  None of these have have eliminated the pain related to the lymphedema or the discomfort regarding excessive swelling and venous congestion.    I have reviewed my discussion with the patient regarding lymphedema and why it  causes symptoms.  Patient will continue wearing graduated compression on a daily basis. The patient should put the compression on first thing in the morning and removing them in the evening. The patient should not sleep in the compression.   In addition, behavioral modification throughout the day will be continued.  This will include frequent elevation (such as in a recliner), use of over the counter pain medications as needed and exercise such as walking.  The systemic causes for chronic edema such as liver, kidney and cardiac etiologies do not appear to have significant changed over the past year.    The patient has chronic , severe lymphedema with hyperpigmentation of the skin and has done MLD, skin care, medication, diet, exercise, elevation and compression for 4 weeks with no improvement,  I am recommending a lymphedema pump.  The patient still has stage 3 lymphedema and therefore, I believe that a lymph pump is needed to improve the control of the patient's lymphedema and improve the quality of life.  Additionally, a lymph pump is warranted because it will reduce the risk of cellulitis and ulceration in the future.  Patient should follow-up in six months   2. Mixed hyperlipidemia Continue statin as ordered and reviewed, no changes at this time   Current Outpatient Medications on File Prior to Visit  Medication Sig Dispense Refill   Accu-Chek FastClix Lancets MISC 1 each by Does not apply route daily before breakfast. 100  each 3   atorvastatin (LIPITOR) 20 MG tablet Take 1 tablet (20 mg total) by mouth daily. 90 tablet 4   augmented betamethasone dipropionate (DIPROLENE-AF) 0.05 % cream Apply 1 Application topically 2 (two) times daily as needed (to affected skin areas). 50 g 4   ergocalciferol (VITAMIN D2) 1.25 MG (50000 UT) capsule Take 1 capsule (50,000 Units total) by mouth once a week. 12 capsule 4   esomeprazole (NEXIUM) 40 MG capsule TAKE 1 CAPSULE(40 MG) BY MOUTH DAILY 90 capsule 4   glucose blood test strip To check blood sugar daily prior to eating in morning, with goal less the 130. 100 each 12   levothyroxine (SYNTHROID) 75 MCG tablet Take 1 tablet (75 mcg total) by mouth daily before breakfast. 90 tablet 4   torsemide (DEMADEX) 100 MG tablet Take 1 tablet (100 mg total) by mouth daily. 90 tablet 4   No current facility-administered medications on file prior to visit.    There are no Patient Instructions on file for this visit. No follow-ups on file.   Kris Hartmann, NP

## 2022-08-05 NOTE — Telephone Encounter (Signed)
Contacted patient and advised that Michelle Marks is still in network. Informed patient that Michelle Marks was inadvertently termed but that the error has been corrected. Patient acknowledged understanding.

## 2022-08-14 ENCOUNTER — Telehealth: Payer: Self-pay

## 2022-08-14 ENCOUNTER — Encounter: Payer: Self-pay | Admitting: Nurse Practitioner

## 2022-08-14 NOTE — Telephone Encounter (Addendum)
Called patient to obtain additional details regarding letter request. Patient states that she needs a letter that specifies why she needs to have her mail box moved. Patient states that she needs the letter to include the following:  Arthritis Lyphedema Problems w/ Back, Knee, and Hip  Hard to walk  No longer has a roommate and lives alone

## 2022-08-14 NOTE — Telephone Encounter (Signed)
Copied from [place] [phone]. Topic: General - Other >> Aug 13, 2022 12:51 PM Chapman Fitch wrote: Reason for CRM: Pt needs provider letter so she can move her mailbox closer to her home due to using a walker and age / please advise

## 2022-08-16 NOTE — Telephone Encounter (Signed)
Called patient and informed her that the letter that she asked for was ready and patient asked if said letter could be mailed. Letter has been placed in mail per patient's instructions

## 2022-08-19 NOTE — Telephone Encounter (Signed)
Concerned about old provider being on her [employer] card for this year and trying to get her card fixed with provider's name on it.  Pt verbalized her concerns and understanding.

## 2022-08-19 NOTE — Telephone Encounter (Signed)
Pt called again stating that [employer] is still saying the issue with Michelle Marks being in network is not fixed and they can not put Jolenes name on pts insurance card / please advise / pt is concerned

## 2022-09-18 DIAGNOSIS — Z0100 Encounter for examination of eyes and vision without abnormal findings: Secondary | ICD-10-CM | POA: Diagnosis not present

## 2022-09-18 DIAGNOSIS — H2513 Age-related nuclear cataract, bilateral: Secondary | ICD-10-CM | POA: Diagnosis not present

## 2022-10-30 ENCOUNTER — Ambulatory Visit: Payer: Medicare HMO | Admitting: Nurse Practitioner

## 2022-11-19 NOTE — Patient Instructions (Incomplete)
Be Involved in Your Health Care:  Taking Medications When medications are taken as directed, they can greatly improve your health. But if they are not taken as instructed, they may not work. In some cases, not taking them correctly can be harmful. To help ensure your treatment remains effective and safe, understand your medications and how to take them.  Your lab results, notes and after visit summary will be available on My Chart. We strongly encourage you to use this feature. If lab results are abnormal the clinic will contact you with the appropriate steps. If the clinic does not contact you assume the results are satisfactory. You can always see your results on My Chart. If you have questions regarding your condition, please contact the clinic during office hours. You can also ask questions on My Chart.  We at [facility] are grateful that you chose [place] to provide care. We strive to provide excellent and compassionate care and are always looking for feedback. If you get a survey from the clinic please complete this.   DASH Eating Plan DASH stands for Dietary Approaches to Stop Hypertension. The DASH eating plan is a healthy eating plan that has been shown to: Lower high blood pressure (hypertension). Reduce your risk for type 2 diabetes, heart disease, and stroke. Help with weight loss. What are tips for following this plan? Reading food labels Check food labels for the amount of salt (sodium) per serving. Choose foods with less than 5 percent of the Daily Value (DV) of sodium. In general, foods with less than 300 milligrams (mg) of sodium per serving fit into this eating plan. To find whole grains, look for the word "whole" as the first word in the ingredient list. Shopping Buy products labeled as "low-sodium" or "no salt added." Buy fresh foods. Avoid canned foods and pre-made or frozen meals. Cooking Try not to add salt when you cook. Use salt-free seasonings or herbs instead  of table salt or sea salt. Check with your health care provider or pharmacist before using salt substitutes. Do not fry foods. Cook foods in healthy ways, such as baking, boiling, grilling, roasting, or broiling. Cook using oils that are good for your heart. These include olive, canola, avocado, soybean, and sunflower oil. Meal planning  Eat a balanced diet. This should include: 4 or more servings of fruits and 4 or more servings of vegetables each day. Try to fill half of your plate with fruits and vegetables. 6-8 servings of whole grains each day. 6 or less servings of lean meat, poultry, or fish each day. 1 oz is 1 serving. A 3 oz (85 g) serving of meat is about the same size as the palm of your hand. One egg is 1 oz (28 g). 2-3 servings of low-fat dairy each day. One serving is 1 cup (237 mL). 1 serving of nuts, seeds, or beans 5 times each week. 2-3 servings of heart-healthy fats. Healthy fats called omega-3 fatty acids are found in foods such as walnuts, flaxseeds, fortified milks, and eggs. These fats are also found in cold-water fish, such as sardines, salmon, and mackerel. Limit how much you eat of: Canned or prepackaged foods. Food that is high in trans fat, such as fried foods. Food that is high in saturated fat, such as fatty meat. Desserts and other sweets, sugary drinks, and other foods with added sugar. Full-fat dairy products. Do not salt foods before eating. Do not eat more than 4 egg yolks a week. Try to  eat at least 2 vegetarian meals a week. Eat more home-cooked food and less restaurant, buffet, and fast food. Lifestyle When eating at a restaurant, ask if your food can be made with less salt or no salt. If you drink alcohol: Limit how much you have to: 0-1 drink a day if you are female. 0-2 drinks a day if you are female. Know how much alcohol is in your drink. In the [place] one drink is one 12 oz bottle of beer (355 mL), one 5 oz glass of wine (148 mL), or one 1 oz  glass of hard liquor (44 mL). General information Avoid eating more than 2,300 mg of salt a day. If you have hypertension, you may need to reduce your sodium intake to 1,500 mg a day. Work with your provider to stay at a healthy body weight or lose weight. Ask what the best weight range is for you. On most days of the week, get at least 30 minutes of exercise that causes your heart to beat faster. This may include walking, swimming, or biking. Work with your provider or dietitian to adjust your eating plan to meet your specific calorie needs. What foods should I eat? Fruits All fresh, dried, or frozen fruit. Canned fruits that are in their natural juice and do not have sugar added to them. Vegetables Fresh or frozen vegetables that are raw, steamed, roasted, or grilled. Low-sodium or reduced-sodium tomato and vegetable juice. Low-sodium or reduced-sodium tomato sauce and tomato paste. Low-sodium or reduced-sodium canned vegetables. Grains Whole-grain or whole-wheat bread. Whole-grain or whole-wheat pasta. Brown rice. Orpah Cobb. Bulgur. Whole-grain and low-sodium cereals. Pita bread. Low-fat, low-sodium crackers. Whole-wheat flour tortillas. Meats and other proteins Skinless chicken or [place]. Ground chicken or [place]. Pork with fat trimmed off. Fish and seafood. Egg whites. Dried beans, peas, or lentils. Unsalted nuts, nut butters, and seeds. Unsalted canned beans. Lean cuts of beef with fat trimmed off. Low-sodium, lean precooked or cured meat, such as sausages or meat loaves. Dairy Low-fat (1%) or fat-free (skim) milk. Reduced-fat, low-fat, or fat-free cheeses. Nonfat, low-sodium ricotta or cottage cheese. Low-fat or nonfat yogurt. Low-fat, low-sodium cheese. Fats and oils Soft margarine without trans fats. Vegetable oil. Reduced-fat, low-fat, or light mayonnaise and salad dressings (reduced-sodium). Canola, safflower, olive, avocado, soybean, and sunflower oils. Avocado. Seasonings and  condiments Herbs. Spices. Seasoning mixes without salt. Other foods Unsalted popcorn and pretzels. Fat-free sweets. The items listed above may not be all the foods and drinks you can have. Talk to a dietitian to learn more. What foods should I avoid? Fruits Canned fruit in a light or heavy syrup. Fried fruit. Fruit in cream or butter sauce. Vegetables Creamed or fried vegetables. Vegetables in a cheese sauce. Regular canned vegetables that are not marked as low-sodium or reduced-sodium. Regular canned tomato sauce and paste that are not marked as low-sodium or reduced-sodium. Regular tomato and vegetable juices that are not marked as low-sodium or reduced-sodium. Rosita Fire. Olives. Grains Baked goods made with fat, such as croissants, muffins, or some breads. Dry pasta or rice meal packs. Meats and other proteins Fatty cuts of meat. Ribs. Fried meat. Tomasa Blase. Bologna, salami, and other precooked or cured meats, such as sausages or meat loaves, that are not lean and low in sodium. Fat from the back of a pig (fatback). Bratwurst. Salted nuts and seeds. Canned beans with added salt. Canned or smoked fish. Whole eggs or egg yolks. Chicken or [place] with skin. Dairy Whole or 2% milk, cream, and  half-and-half. Whole or full-fat cream cheese. Whole-fat or sweetened yogurt. Full-fat cheese. Nondairy creamers. Whipped toppings. Processed cheese and cheese spreads. Fats and oils Butter. Stick margarine. Lard. Shortening. Ghee. Bacon fat. Tropical oils, such as coconut, palm kernel, or palm oil. Seasonings and condiments Onion salt, garlic salt, seasoned salt, table salt, and sea salt. Worcestershire sauce. Tartar sauce. Barbecue sauce. Teriyaki sauce. Soy sauce, including reduced-sodium soy sauce. Steak sauce. Canned and packaged gravies. Fish sauce. Oyster sauce. Cocktail sauce. Store-bought horseradish. Ketchup. Mustard. Meat flavorings and tenderizers. Bouillon cubes. Hot sauces. Pre-made or packaged  marinades. Pre-made or packaged taco seasonings. Relishes. Regular salad dressings. Other foods Salted popcorn and pretzels. The items listed above may not be all the foods and drinks you should avoid. Talk to a dietitian to learn more. Where to find more information National Heart, Lung, and Blood Institute (NHLBI): BuffaloDryCleaner.gl American Heart Association (AHA): heart.org Academy of Nutrition and Dietetics: eatright.org National Kidney Foundation (NKF): kidney.org This information is not intended to replace advice given to you by your health care provider. Make sure you discuss any questions you have with your health care provider. Document Revised: 06/20/2022 Document Reviewed: 06/20/2022 Elsevier Patient Education  2024 [employer].

## 2022-11-20 ENCOUNTER — Ambulatory Visit (INDEPENDENT_AMBULATORY_CARE_PROVIDER_SITE_OTHER): Payer: Medicare HMO | Admitting: Nurse Practitioner

## 2022-11-20 ENCOUNTER — Encounter: Payer: Self-pay | Admitting: Nurse Practitioner

## 2022-11-20 DIAGNOSIS — E559 Vitamin D deficiency, unspecified: Secondary | ICD-10-CM | POA: Diagnosis not present

## 2022-11-20 DIAGNOSIS — I890 Lymphedema, not elsewhere classified: Secondary | ICD-10-CM | POA: Diagnosis not present

## 2022-11-20 DIAGNOSIS — R808 Other proteinuria: Secondary | ICD-10-CM

## 2022-11-20 DIAGNOSIS — R7309 Other abnormal glucose: Secondary | ICD-10-CM | POA: Diagnosis not present

## 2022-11-20 DIAGNOSIS — E039 Hypothyroidism, unspecified: Secondary | ICD-10-CM

## 2022-11-20 DIAGNOSIS — M1A0710 Idiopathic chronic gout, right ankle and foot, without tophus (tophi): Secondary | ICD-10-CM | POA: Diagnosis not present

## 2022-11-20 DIAGNOSIS — E782 Mixed hyperlipidemia: Secondary | ICD-10-CM | POA: Diagnosis not present

## 2022-11-20 DIAGNOSIS — R030 Elevated blood-pressure reading, without diagnosis of hypertension: Secondary | ICD-10-CM

## 2022-11-20 DIAGNOSIS — K219 Gastro-esophageal reflux disease without esophagitis: Secondary | ICD-10-CM

## 2022-11-20 LAB — BAYER DCA HB A1C WAIVED: HB A1C (BAYER DCA - WAIVED): 6.5 % — ABNORMAL HIGH (ref 4.8–5.6)

## 2022-11-20 MED ORDER — PREGABALIN 25 MG PO CAPS
25.0000 mg | ORAL_CAPSULE | Freq: Every day | ORAL | 4 refills | Status: DC
Start: 2022-11-20 — End: 2023-01-06

## 2022-11-20 NOTE — Assessment & Plan Note (Signed)
Urine ALB 80 on past visit, may benefit from lose dose ACE or ARB in future for this.  Recheck today, she will take sample home and bring back.

## 2022-11-20 NOTE — Assessment & Plan Note (Signed)
Chronic, stable.  BP at goal in office today.  Recommend she monitor BP at least a few mornings a week at home and document.  DASH diet at home.  Continue current medication regimen and adjust as needed.  Labs today: CMP and lipid.  Return in 6 months.  

## 2022-11-20 NOTE — Assessment & Plan Note (Addendum)
A1c 6.8% last visit, in diabetic range.  Recheck today.  Discussed at length with her.  She does not want to start medication, which to focus on diet reducing carbohydrate and sugar + increasing chair exercises.  Will trial this, if elevated then consider a GLP1, which may offer benefit to weight and A1c.

## 2022-11-20 NOTE — Assessment & Plan Note (Signed)
Chronic, ongoing.  Continue current medication regimen and adjust regimen as needed.  Thyroid labs obtained today.

## 2022-11-20 NOTE — Assessment & Plan Note (Signed)
BMI 51.14.  Recommended eating smaller high protein, low fat meals more frequently and exercising 30 mins a day 5 times a week with a goal of 10-15lb weight loss in the next 3 months. Patient voiced their understanding and motivation to adhere to these recommendations.

## 2022-11-20 NOTE — Assessment & Plan Note (Signed)
Chronic, ongoing.  Continue current medication regimen and adjust as needed.  Lipid panel and CMP today.    

## 2022-11-20 NOTE — Progress Notes (Addendum)
BP 126/78 (BP Location: Left Arm, Patient Position: Sitting, Cuff Size: Large)   Pulse 72   Temp 98 F (36.7 C) (Oral)   Resp 18   Ht 5' 2.99" (1.6 m)   Wt 288 lb 9.6 oz (130.9 kg)   SpO2 96%   BMI 51.14 kg/m    Subjective:    Patient ID: Michelle Marks, female    DOB: 23-Apr-1951, 72 y.o.   MRN: [id number]  HPI: Michelle Marks is a 72 y.o. female  Chief Complaint  Patient presents with   Hyperlipidemia   Hypertension   Lymphedema   Gastroesophageal Reflux   Gout   Vit D   A1c Check   HYPERTENSION / HYPERLIPIDEMIA She continues on Torsemide for lymphedema and BP and Atorvastatin for HLD. Has tried compression hose without much benefit and struggles continually getting these on and Tylenol does not always offer benefit.  Uses compression wraps daily.  Last saw vascular 07/23/22. Satisfied with current treatment? yes Duration of hypertension: chronic BP monitoring frequency: not  checking BP range:  BP medication side effects: no Duration of hyperlipidemia: chronic Cholesterol medication side effects: no Cholesterol supplements: none Medication compliance: good compliance Aspirin: no Recent stressors: no Recurrent headaches: no Visual changes: no Palpitations: no Dyspnea: no Chest pain: no Lower extremity edema: yes Dizzy/lightheaded: no   The 10-year ASCVD risk score (Arnett DK, et al., 2019) is: 23.5%   Values used to calculate the score:     Age: 65 years     Sex: Female     Is Non-Hispanic African American: No     Diabetic: Yes     Tobacco smoker: No     Systolic Blood Pressure: 126 mmHg     Is BP treated: Yes     HDL Cholesterol: 56 mg/dL     Total Cholesterol: 166 mg/dL    GERD Takes Nexium daily due to hiatal hernia. GERD control status: stable Satisfied with current treatment? yes Heartburn frequency: not recently Medication side effects: no  Medication compliance: good Previous GERD medications: Protonix Antacid use frequency:  Mylanta if  needed Dysphagia: no Odynophagia:  no Hematemesis: no Blood in stool: no EGD: no   GOUTY ARTHRITIS & LYMPHEDEMA No current medications for this, last uric acid level was 8.4 on 05/01/22.  She declined medications, was offered Allopurinol -- has never taken.  At night is hard to stay asleep due to discomfort at baseline in joints -- does sleep in bed.  Is concerned for restless leg syndrome.  Would like pain medication.  History of elevation in Rf on review past labs and saw rheumatology on 08/21/20 = no signs of RA.  They offered Allopurinol.  Recent labs showed negative ANA.     Has psoriasis, has had since in her 9's.  Does not want to take medication for this.  Follows with dermatology, Dr. Roseanne Kaufman.  Needs refill on ointment. Duration:chronic Right 1st metatarsophalangeal pain: no Left 1st metatarsophalangeal pain: in past Right knee pain: yes Left knee pain: yes Swelling: no Redness: no Trauma: no Recent dietary change or indiscretion: no Fevers: no Nausea/vomiting: no Aggravating factors nothing Alleviating factors: nothing Status:  better Treatments attempted: Gabapentin   HYPOTHYROIDISM Continues on Levothyroxine 75 MCG daily.  Taking Vitamin D supplement for history of lows. Thyroid control status:controlled Satisfied with current treatment? yes Medication side effects: no Medication compliance: good compliance Etiology of hypothyroidism:  Recent dose adjustment:no Fatigue: no Cold intolerance: no Heat intolerance: no Weight gain: no  Weight loss: no Constipation: no Diarrhea/loose stools: no Palpitations: no Lower extremity edema: no Anxiety/depressed mood: no  IFG Last visit in November was 6.8%, we discussed at time and she wanted to focus on diet and weight.  Has lost 4 lbs since last visit.  Does get meals on wheels. Does endorse enjoying sweets, including M&Ms. Polydipsia/polyuria: no Visual disturbance: no Chest pain: no Paresthesias:  no  Relevant past medical, surgical, family and social history reviewed and updated as indicated. Interim medical history since our last visit reviewed. Allergies and medications reviewed and updated.  Review of Systems  Constitutional:  Negative for activity change, appetite change, diaphoresis, fatigue and fever.  Respiratory:  Negative for cough, chest tightness and shortness of breath.   Cardiovascular:  Positive for leg swelling. Negative for chest pain and palpitations.  Gastrointestinal: Negative.   Endocrine: Negative for cold intolerance, heat intolerance, polydipsia, polyphagia and polyuria.  Musculoskeletal:  Positive for arthralgias.  Neurological: Negative.   Psychiatric/Behavioral: Negative.     Per HPI unless specifically indicated above     Objective:    BP 126/78 (BP Location: Left Arm, Patient Position: Sitting, Cuff Size: Large)   Pulse 72   Temp 98 F (36.7 C) (Oral)   Resp 18   Ht 5' 2.99" (1.6 m)   Wt 288 lb 9.6 oz (130.9 kg)   SpO2 96%   BMI 51.14 kg/m   Wt Readings from Last 3 Encounters:  11/20/22 288 lb 9.6 oz (130.9 kg)  07/23/22 292 lb (132.5 kg)  07/22/22 292 lb (132.5 kg)    Physical Exam Vitals and nursing note reviewed.  Constitutional:      General: She is awake. She is not in acute distress.    Appearance: She is well-developed and well-groomed. She is obese. She is not ill-appearing or toxic-appearing.  HENT:     Head: Normocephalic.     Right Ear: Hearing normal.     Left Ear: Hearing normal.  Eyes:     General: Lids are normal.        Right eye: No discharge.        Left eye: No discharge.     Conjunctiva/sclera: Conjunctivae normal.     Pupils: Pupils are equal, round, and reactive to light.  Neck:     Thyroid: No thyromegaly.     Vascular: No carotid bruit.  Cardiovascular:     Rate and Rhythm: Normal rate and regular rhythm.     Heart sounds: Normal heart sounds. No murmur heard.    No gallop.  Pulmonary:     Effort:  Pulmonary effort is normal. No accessory muscle usage or respiratory distress.     Breath sounds: Normal breath sounds.  Abdominal:     General: Bowel sounds are normal.     Palpations: Abdomen is soft. There is no hepatomegaly.     Tenderness: There is no abdominal tenderness.  Musculoskeletal:     Cervical back: Normal range of motion and neck supple.     Right lower leg: 2+ Edema present.     Left lower leg: 2+ Edema present.  Skin:    General: Skin is warm and dry.  Neurological:     Mental Status: She is alert and oriented to person, place, and time.  Psychiatric:        Attention and Perception: Attention normal.        Mood and Affect: Mood normal.        Speech: Speech normal.  Behavior: Behavior normal. Behavior is cooperative.        Thought Content: Thought content normal.    Results for orders placed or performed in visit on 05/01/22  Comprehensive metabolic panel  Result Value Ref Range   Glucose 114 (H) 70 - 99 mg/dL   BUN 15 8 - 27 mg/dL   Creatinine, Ser 9.60 0.57 - 1.00 mg/dL   eGFR 97 >45 WU/JWJ/1.91   BUN/Creatinine Ratio 26 12 - 28   Sodium 144 134 - 144 mmol/L   Potassium 3.3 (L) 3.5 - 5.2 mmol/L   Chloride 98 96 - 106 mmol/L   CO2 27 20 - 29 mmol/L   Calcium 9.6 8.7 - 10.3 mg/dL   Total Protein 6.9 6.0 - 8.5 g/dL   Albumin 4.4 3.8 - 4.8 g/dL   Globulin, Total 2.5 1.5 - 4.5 g/dL   Albumin/Globulin Ratio 1.8 1.2 - 2.2   Bilirubin Total 0.6 0.0 - 1.2 mg/dL   Alkaline Phosphatase 145 (H) 44 - 121 IU/L   AST 17 0 - 40 IU/L   ALT 11 0 - 32 IU/L  Lipid Panel w/o Chol/HDL Ratio  Result Value Ref Range   Cholesterol, Total 166 100 - 199 mg/dL   Triglycerides 478 0 - 149 mg/dL   HDL 56 >29 mg/dL   VLDL Cholesterol Cal 23 5 - 40 mg/dL   LDL Chol Calc (NIH) 87 0 - 99 mg/dL  Magnesium  Result Value Ref Range   Magnesium 1.9 1.6 - 2.3 mg/dL  Bayer DCA Hb F6O Waived  Result Value Ref Range   HB A1C (BAYER DCA - WAIVED) 6.8 (H) 4.8 - 5.6 %  Uric  acid  Result Value Ref Range   Uric Acid 8.4 (H) 3.1 - 7.9 mg/dL  VITAMIN D 25 Hydroxy (Vit-D Deficiency, Fractures)  Result Value Ref Range   Vit D, 25-Hydroxy 28.4 (L) 30.0 - 100.0 ng/mL      Assessment & Plan:   Problem List Items Addressed This Visit       Cardiovascular and Mediastinum   White coat syndrome without diagnosis of hypertension    Chronic, stable.  BP at goal in office today.  Recommend she monitor BP at least a few mornings a week at home and document.  DASH diet at home.  Continue current medication regimen and adjust as needed.  Labs today: CMP and lipid.  Return in 6 months.       Relevant Orders   CBC with Differential/Platelet   Comprehensive metabolic panel     Digestive   GERD without esophagitis    Chronic, ongoing.  At this time continue Nexium is offering benefit.  She does have a hiatal hernia, discussed with her.  Mag level on labs up to date.          Endocrine   Hypothyroidism    Chronic, ongoing.  Continue current medication regimen and adjust regimen as needed.  Thyroid labs obtained today.      Relevant Orders   TSH   T4, free     Musculoskeletal and Integument   Idiopathic chronic gout of right foot without tophus    Chronic, ongoing.  Denies any current pain.  Recheck uric acid level today.  She has not tried Allopurinol, but would benefit from this -- discussed at length with her.  May benefit her overall chronic pain issues.  We discussed opioid risks and that this is not a long term treatment.  Will trial Lyrica at night,  did not benefit from Gabapentin in past.  May have some benefit for chronic pain with Lyrica.      Relevant Orders   Uric acid     Other   Elevated hemoglobin A1c measurement    A1c 6.8% last visit, in diabetic range.  Recheck today.  Discussed at length with her.  She does not want to start medication, which to focus on diet reducing carbohydrate and sugar + increasing chair exercises.  Will trial this, if  elevated then consider a GLP1, which may offer benefit to weight and A1c.      Relevant Orders   Bayer DCA Hb A1c Waived   Microalbumin, Urine Waived   Lymphedema in adult patient    Chronic, ongoing.  Continue collaboration with vascular.  At this time continue Torsemide as ordered by previous provider, although limited evidence for this being beneficial in lymphedema. Recommend continue compression hose at home and take Tylenol 1000 MG TID as needed.        Relevant Orders   Comprehensive metabolic panel   Mixed hyperlipidemia    Chronic, ongoing.  Continue current medication regimen and adjust as needed.  Lipid panel and CMP today.        Relevant Orders   Comprehensive metabolic panel   Lipid Panel w/o Chol/HDL Ratio   Morbid obesity (HCC) - Primary    BMI 51.14.  Recommended eating smaller high protein, low fat meals more frequently and exercising 30 mins a day 5 times a week with a goal of 10-15lb weight loss in the next 3 months. Patient voiced their understanding and motivation to adhere to these recommendations.       Proteinuria    Urine ALB 80 on past visit, may benefit from lose dose ACE or ARB in future for this.  Recheck today, she will take sample home and bring back.      Relevant Orders   Microalbumin, Urine Waived   Vitamin D deficiency    Chronic, ongoing.  Continue weekly Vitamin D, refills sent in.  Check Vit D level today and recommend she obtain DEXA as ordered last visit.      Relevant Orders   VITAMIN D 25 Hydroxy (Vit-D Deficiency, Fractures)     Follow up plan: Return in about 6 months (around 05/22/2023) for HTN/HLD, GOUT, ARTHRITIS, THYROID, IFG.

## 2022-11-20 NOTE — Assessment & Plan Note (Signed)
Chronic, ongoing.  At this time continue Nexium is offering benefit.  She does have a hiatal hernia, discussed with her.  Mag level on labs up to date.

## 2022-11-20 NOTE — Assessment & Plan Note (Addendum)
Chronic, ongoing.  Denies any current pain.  Recheck uric acid level today.  She has not tried Allopurinol, but would benefit from this -- discussed at length with her.  May benefit her overall chronic pain issues.  We discussed opioid risks and that this is not a long term treatment.  Will trial Lyrica at night, did not benefit from Gabapentin in past.  May have some benefit for chronic pain with Lyrica.

## 2022-11-20 NOTE — Assessment & Plan Note (Signed)
Chronic, ongoing.  Continue collaboration with vascular.  At this time continue Torsemide as ordered by previous provider, although limited evidence for this being beneficial in lymphedema. Recommend continue compression hose at home and take Tylenol 1000 MG TID as needed.   

## 2022-11-20 NOTE — Addendum Note (Signed)
Addended by: Aura Dials T on: 11/20/2022 03:10 PM   Modules accepted: Orders

## 2022-11-20 NOTE — Assessment & Plan Note (Signed)
Chronic, ongoing.  Continue weekly Vitamin D, refills sent in.  Check Vit D level today and recommend she obtain DEXA as ordered last visit. 

## 2022-11-21 ENCOUNTER — Other Ambulatory Visit: Payer: Self-pay | Admitting: Nurse Practitioner

## 2022-11-21 LAB — COMPREHENSIVE METABOLIC PANEL
ALT: 11 IU/L (ref 0–32)
AST: 14 IU/L (ref 0–40)
Albumin/Globulin Ratio: 1.7 (ref 1.2–2.2)
Albumin: 4.3 g/dL (ref 3.8–4.8)
Alkaline Phosphatase: 148 IU/L — ABNORMAL HIGH (ref 44–121)
BUN/Creatinine Ratio: 23 (ref 12–28)
BUN: 16 mg/dL (ref 8–27)
Bilirubin Total: 0.8 mg/dL (ref 0.0–1.2)
CO2: 27 mmol/L (ref 20–29)
Calcium: 9.4 mg/dL (ref 8.7–10.3)
Chloride: 98 mmol/L (ref 96–106)
Creatinine, Ser: 0.7 mg/dL (ref 0.57–1.00)
Globulin, Total: 2.5 g/dL (ref 1.5–4.5)
Glucose: 105 mg/dL — ABNORMAL HIGH (ref 70–99)
Potassium: 3.4 mmol/L — ABNORMAL LOW (ref 3.5–5.2)
Sodium: 142 mmol/L (ref 134–144)
Total Protein: 6.8 g/dL (ref 6.0–8.5)
eGFR: 92 mL/min/{1.73_m2} (ref >59)

## 2022-11-21 LAB — LIPID PANEL W/O CHOL/HDL RATIO
Cholesterol, Total: 196 mg/dL (ref 100–199)
HDL: 54 mg/dL (ref >39)
LDL Chol Calc (NIH): 115 mg/dL — ABNORMAL HIGH (ref 0–99)
Triglycerides: 152 mg/dL — ABNORMAL HIGH (ref 0–149)
VLDL Cholesterol Cal: 27 mg/dL (ref 5–40)

## 2022-11-21 LAB — CBC WITH DIFFERENTIAL/PLATELET
Basophils Absolute: 0 10*3/uL (ref 0.0–0.2)
Basos: 1 %
EOS (ABSOLUTE): 0.1 10*3/uL (ref 0.0–0.4)
Eos: 2 %
Hematocrit: 44.2 % (ref 34.0–46.6)
Hemoglobin: 14.1 g/dL (ref 11.1–15.9)
Immature Grans (Abs): 0 10*3/uL (ref 0.0–0.1)
Immature Granulocytes: 0 %
Lymphocytes Absolute: 1.3 10*3/uL (ref 0.7–3.1)
Lymphs: 20 %
MCH: 28.8 pg (ref 26.6–33.0)
MCHC: 31.9 g/dL (ref 31.5–35.7)
MCV: 90 fL (ref 79–97)
Monocytes Absolute: 0.4 10*3/uL (ref 0.1–0.9)
Monocytes: 6 %
Neutrophils Absolute: 4.9 10*3/uL (ref 1.4–7.0)
Neutrophils: 71 %
Platelets: 239 10*3/uL (ref 150–450)
RBC: 4.89 x10E6/uL (ref 3.77–5.28)
RDW: 13.5 % (ref 11.7–15.4)
WBC: 6.8 10*3/uL (ref 3.4–10.8)

## 2022-11-21 LAB — VITAMIN D 25 HYDROXY (VIT D DEFICIENCY, FRACTURES): Vit D, 25-Hydroxy: 30.5 ng/mL (ref 30.0–100.0)

## 2022-11-21 LAB — T4, FREE: Free T4: 1.43 ng/dL (ref 0.82–1.77)

## 2022-11-21 LAB — URIC ACID: Uric Acid: 7.9 mg/dL (ref 3.1–7.9)

## 2022-11-21 LAB — TSH: TSH: 2.39 u[IU]/mL (ref 0.450–4.500)

## 2022-11-21 MED ORDER — ALLOPURINOL 100 MG PO TABS: 100.0000 mg | ORAL_TABLET | Freq: Every day | ORAL | 3 refills | Status: DC

## 2022-11-21 MED ORDER — ATORVASTATIN CALCIUM 40 MG PO TABS
40.0000 mg | ORAL_TABLET | Freq: Every day | ORAL | 3 refills | Status: DC
Start: 2022-11-21 — End: 2024-02-06

## 2022-11-21 NOTE — Progress Notes (Signed)
Contacted via MyChart   Good evening Michelle Marks, your labs have returned: - Kidney function, creatinine and eGFR, remains normal, as is liver function, AST and ALT.  - Potassium level is a little low, this can be related to diuretic therapy.  Please ensure you are increasing potassium rich foods in diet like bananas, mangoes, dried fruit, green leafy vegetables.  We will recheck next visit and if still low we may need to start daily supplement. - Cholesterol levels remain above goal, I recommend increasing your Atorvastatin to 40 MG daily.  I will send this dose change in and we will recheck next visit. - Remainder of labs overall stable with exception of uric acid which remains a little elevated, I do recommend a trial of Allopurinol and will send this in so you may try if you wish, see if it helps your pain.  Any questions? Keep being amazing!!  Thank you for allowing me to participate in your care.  I appreciate you. Kindest regards, Beyonce Sawatzky

## 2022-11-22 ENCOUNTER — Ambulatory Visit: Payer: Self-pay

## 2022-11-22 NOTE — Telephone Encounter (Signed)
  Chief Complaint: Medication question Symptoms: none Frequency: na Pertinent Negatives: Patient denies na Disposition: [] ED /[] Urgent Care (no appt availability in office) / [] Appointment(In office/virtual)/ []  [facility]/ [] Home Care/ [] Refused Recommended Disposition /[] [facility]/ [x]  Follow-up with PCP Additional Notes: Pt states that Jolene told her that she could use this cream on her hip. According to package directions, it should not be. Please advise.    Summary: med management   Pt stated at her recent appointment with Jord[facility], she advised her to use Voltaren Arthritis Pain Cream for her hip. However, on the box, it says not to use for the back, hip, or shoulder.  Pt seeking clinical advice.     Reason for Disposition  [1] Caller has NON-URGENT medicine question about med that PCP prescribed AND [2] triager unable to answer question  Answer Assessment - Initial Assessment Questions 1. NAME of MEDICINE: "What medicine(s) are you calling about?"     Voltaren  Pain cream 2. QUESTION: "What is your question?" (e.g., double dose of medicine, side effect)     Can pt use this on her hip. 3. PRESCRIBER: "Who prescribed the medicine?" Reason: if prescribed by specialist, call should be referred to that group.     Jolene  Protocols used: Medication Question Call-A-AH

## 2022-11-25 NOTE — Telephone Encounter (Signed)
Pt returned call and was given the message from Aura Dials, NP dated 11/24/2022 at 3:13 PM regarding using the Voltaren gel on her hip.    She thanked me for calling back and had no additional questions.

## 2022-11-25 NOTE — Telephone Encounter (Signed)
LVM for patient to return call. 

## 2022-12-17 NOTE — Telephone Encounter (Signed)
Copied from CRM [id number]. Topic: Appointment Scheduling - Scheduling Inquiry for Clinic >> Dec 17, 2022  4:06 PM Herbert Seta T wrote: Pt is wanting to be seen but she is wanting to be seen at 1:40 pm, however, she is not willing to see another provider.  I explained all this to her and was still very adamant about either the nurse or provider calling her back.  I also told her that the provider and nurse are busy assisting other patients that has appointments on that day.   She is very persist about wanting some type of pain medication and she said it doesn't have to be a controlled substance.  I explained to her it was a state law that a provider cannot just prescribe a patient a narcotic without being seen.  Finally I told her I would send the provider a message back and we will see what we can do if anything.  Stated she wanted something for pain until she can make her appointment on 01/06/2023.  Please advise.

## 2022-12-18 ENCOUNTER — Encounter: Payer: Self-pay | Admitting: Nurse Practitioner

## 2022-12-18 ENCOUNTER — Telehealth: Payer: Self-pay | Admitting: Nurse Practitioner

## 2022-12-18 NOTE — Telephone Encounter (Signed)
Patient  wants to know how many tylenol she is allowed to take for pain. Patient has need added to waiting list for a sooner appt. And has an appointment on 7/22 for pain.

## 2022-12-18 NOTE — Telephone Encounter (Signed)
Called to explain providers notes patient expressed understanding.

## 2022-12-18 NOTE — Telephone Encounter (Signed)
Called and pt stated that someone has already called her regarding making an appointment but stated that she had sent a message with the agent that called earlier about how much Tylenol she could take.  There were no notes or documentation so I apologized to her for the duplicate call.  She stated that she was put on a waiting list.

## 2023-01-04 NOTE — Patient Instructions (Signed)
Diabetes Mellitus Basics  Diabetes mellitus, or diabetes, is a long-term (chronic) disease. It occurs when the body does not properly use sugar (glucose) that is released from food after you eat. Diabetes mellitus may be caused by one or both of these problems: Your pancreas does not make enough of a hormone called insulin. Your body does not react in a normal way to the insulin that it makes. Insulin lets glucose enter cells in your body. This gives you energy. If you have diabetes, glucose cannot get into cells. This causes high blood glucose (hyperglycemia). How to treat and manage diabetes You may need to take insulin or other diabetes medicines daily to keep your glucose in balance. If you are prescribed insulin, you will learn how to give yourself insulin by injection. You may need to adjust the amount of insulin you take based on the foods that you eat. You will need to check your blood glucose levels using a glucose monitor as told by your health care provider. The readings can help determine if you have low or high blood glucose. Generally, you should have these blood glucose levels: Before meals (preprandial): 80-130 mg/dL (4.4-7.2 mmol/L). After meals (postprandial): below 180 mg/dL (10 mmol/L). Hemoglobin A1c (HbA1c) level: less than 7%. Your health care provider will set treatment goals for you. Keep all follow-up visits. This is important. Follow these instructions at home: Diabetes medicines Take your diabetes medicines every day as told by your health care provider. List your diabetes medicines here: Name of medicine: ______________________________ Amount (dose): _______________ Time (a.m./p.m.): _______________ Notes: ___________________________________ Name of medicine: ______________________________ Amount (dose): _______________ Time (a.m./p.m.): _______________ Notes: ___________________________________ Name of medicine: ______________________________ Amount (dose):  _______________ Time (a.m./p.m.): _______________ Notes: ___________________________________ Insulin If you use insulin, list the types of insulin you use here: Insulin type: ______________________________ Amount (dose): _______________ Time (a.m./p.m.): _______________Notes: ___________________________________ Insulin type: ______________________________ Amount (dose): _______________ Time (a.m./p.m.): _______________ Notes: ___________________________________ Insulin type: ______________________________ Amount (dose): _______________ Time (a.m./p.m.): _______________ Notes: ___________________________________ Insulin type: ______________________________ Amount (dose): _______________ Time (a.m./p.m.): _______________ Notes: ___________________________________ Insulin type: ______________________________ Amount (dose): _______________ Time (a.m./p.m.): _______________ Notes: ___________________________________ Managing blood glucose  Check your blood glucose levels using a glucose monitor as told by your health care provider. Write down the times that you check your glucose levels here: Time: _______________ Notes: ___________________________________ Time: _______________ Notes: ___________________________________ Time: _______________ Notes: ___________________________________ Time: _______________ Notes: ___________________________________ Time: _______________ Notes: ___________________________________ Time: _______________ Notes: ___________________________________  Low blood glucose Low blood glucose (hypoglycemia) is when glucose is at or below 70 mg/dL (3.9 mmol/L). Symptoms may include: Feeling: Hungry. Sweaty and clammy. Irritable or easily upset. Dizzy. Sleepy. Having: A fast heartbeat. A headache. A change in your vision. Numbness around the mouth, lips, or tongue. Having trouble with: Moving (coordination). Sleeping. Treating low blood glucose To treat low blood  glucose, eat or drink something containing sugar right away. If you can think clearly and swallow safely, follow the 15:15 rule: Take 15 grams of a fast-acting carb (carbohydrate), as told by your health care provider. Some fast-acting carbs are: Glucose tablets: take 3-4 tablets. Hard candy: eat 3-5 pieces. Fruit juice: drink 4 oz (120 mL). Regular (not diet) soda: drink 4-6 oz (120-180 mL). Honey or sugar: eat 1 Tbsp (15 mL). Check your blood glucose levels 15 minutes after you take the carb. If your glucose is still at or below 70 mg/dL (3.9 mmol/L), take 15 grams of a carb again. If your glucose does not go above 70 mg/dL (3.9 mmol/L) after   3 tries, get help right away. After your glucose goes back to normal, eat a meal or a snack within 1 hour. Treating very low blood glucose If your glucose is at or below 54 mg/dL (3 mmol/L), you have very low blood glucose (severe hypoglycemia). This is an emergency. Do not wait to see if the symptoms will go away. Get medical help right away. Call your local emergency services (911 in the [place] Do not drive yourself to the hospital. Questions to ask your health care provider Should I talk with a diabetes educator? What equipment will I need to care for myself at home? What diabetes medicines do I need? When should I take them? How often do I need to check my blood glucose levels? What number can I call if I have questions? When is my follow-up visit? Where can I find a support group for people with diabetes? Where to find more information American Diabetes Association: www.diabetes.org Association of Diabetes Care and Education Specialists: www.diabeteseducator.org Contact a health care provider if: Your blood glucose is at or above 240 mg/dL (13.3 mmol/L) for 2 days in a row. You have been sick or have had a fever for 2 days or more, and you are not getting better. You have any of these problems for more than 6 hours: You cannot eat or  drink. You feel nauseous. You vomit. You have diarrhea. Get help right away if: Your blood glucose is lower than 54 mg/dL (3 mmol/L). You get confused. You have trouble thinking clearly. You have trouble breathing. These symptoms may represent a serious problem that is an emergency. Do not wait to see if the symptoms will go away. Get medical help right away. Call your local emergency services (911 in the [place] Do not drive yourself to the hospital. Summary Diabetes mellitus is a chronic disease that occurs when the body does not properly use sugar (glucose) that is released from food after you eat. Take insulin and diabetes medicines as told. Check your blood glucose every day, as often as told. Keep all follow-up visits. This is important. This information is not intended to replace advice given to you by your health care provider. Make sure you discuss any questions you have with your health care provider. Document Revised: 10/05/2019 Document Reviewed: 10/05/2019 Elsevier Patient Education  2024 [employer]  

## 2023-01-06 ENCOUNTER — Ambulatory Visit (INDEPENDENT_AMBULATORY_CARE_PROVIDER_SITE_OTHER): Payer: Medicare HMO | Admitting: Nurse Practitioner

## 2023-01-06 ENCOUNTER — Encounter: Payer: Self-pay | Admitting: Nurse Practitioner

## 2023-01-06 VITALS — BP 125/66 | HR 85 | Temp 98.1°F | Ht 62.99 in | Wt 289.0 lb

## 2023-01-06 DIAGNOSIS — E669 Obesity, unspecified: Secondary | ICD-10-CM

## 2023-01-06 DIAGNOSIS — G8929 Other chronic pain: Secondary | ICD-10-CM | POA: Diagnosis not present

## 2023-01-06 DIAGNOSIS — M5441 Lumbago with sciatica, right side: Secondary | ICD-10-CM | POA: Diagnosis not present

## 2023-01-06 DIAGNOSIS — E1169 Type 2 diabetes mellitus with other specified complication: Secondary | ICD-10-CM

## 2023-01-06 LAB — MICROALBUMIN, URINE WAIVED
Creatinine, Urine Waived: 100 mg/dL (ref 10–300)
Microalb, Ur Waived: 80 mg/L — ABNORMAL HIGH (ref 0–19)
Microalb/Creat Ratio: <30 mg/g (ref <30)

## 2023-01-06 MED ORDER — TRAMADOL HCL 50 MG PO TABS: 50.0000 mg | ORAL_TABLET | Freq: Two times a day (BID) | ORAL | 0 refills | Status: AC | PRN

## 2023-01-06 MED ORDER — TIZANIDINE HCL 4 MG PO TABS: 4.0000 mg | ORAL_TABLET | Freq: Three times a day (TID) | ORAL | 2 refills | Status: DC | PRN

## 2023-01-06 NOTE — Assessment & Plan Note (Signed)
BMI 51.21.  Recommended eating smaller high protein, low fat meals more frequently and exercising 30 mins a day 5 times a week with a goal of 10-15lb weight loss in the next 3 months. Patient voiced their understanding and motivation to adhere to these recommendations.

## 2023-01-06 NOTE — Assessment & Plan Note (Addendum)
A1c 6.5% in June, trend down from 6.8%. Discussed at length with her.  She does not want to start medication, which to focus on diet reducing carbohydrate and sugar + increasing chair exercises.  Will trial this, if elevated then consider a GLP1, which may offer benefit to weight and A1c.  Urine ALB today.

## 2023-01-06 NOTE — Assessment & Plan Note (Signed)
Chronic pain to back and hips.  Discussed at length with patient.  Will send in 30 day supply only of Tramadol to use sparingly to help with rest.  Discussed if ongoing pain management needed would place referral to pain management, she prefers to avoid injections, although this may be beneficial and PT in home.  Tizanidine for muscle pain sent in.  Return in 4 weeks for follow-up.

## 2023-01-06 NOTE — Progress Notes (Addendum)
BP 125/66   Pulse 85   Temp 98.1 F (36.7 C) (Oral)   Ht 5' 2.99" (1.6 m)   Wt 289 lb (131.1 kg)   SpO2 96%   BMI 51.21 kg/m    Subjective:    Patient ID: Michelle Marks, female    DOB: 1950-09-29, 72 y.o.   MRN: [id number]  HPI: Michelle Marks is a 72 y.o. female  Chief Complaint  Patient presents with   Gout   Pain   CHRONIC PAIN  Continues to have issues with chronic pain.  Does have underlying gout, has refused medications for this.  Last uric acid 7.9.  Started on Lyrica at visit on 11/20/22 for chronic back and hip pain.  Did not find much benefit.  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  Reports having RLS at baseline -- tried magnesium but could not get down.  When she is at rest her legs move a lot and cramp.  She would like to take Tramadol.  Tylenol is not working for pain and she is not sleeping well.  Would like to sleep in her bed.  Went to pain management in past and they wanted to do injections, but she did not want to do this.    Have been monitoring A1c due to diabetes diagnosis in November 2023.  She does not like to take medications, last A1c had trended down to 6.5%, previous was 6.8%.   Pain control status: uncontrolled Duration: chronic Location:  Quality: dull, aching, and throbbing Current Pain Level: 0/10 Previous Pain Level: 7/10 Breakthrough pain: no Benefit from narcotic medications: yes in the past What Activities task can be accomplished with current medication? Interested in weaning off narcotics:no   Stool softners/OTC fiber: yes  Previous pain specialty evaluation: yes Non-narcotic analgesic meds: yes Narcotic contract:  short term only for now, she is aware no long term pain management in office    Relevant past medical, surgical, family and social history reviewed and updated as indicated. Interim medical history since our last visit reviewed. Allergies and medications reviewed and  updated.  Review of Systems  Constitutional:  Negative for activity change, appetite change, diaphoresis, fatigue and fever.  Respiratory:  Negative for cough, chest tightness and shortness of breath.   Cardiovascular:  Positive for leg swelling. Negative for chest pain and palpitations.  Gastrointestinal: Negative.   Endocrine: Negative for cold intolerance, heat intolerance, polydipsia, polyphagia and polyuria.  Musculoskeletal:  Positive for arthralgias.  Neurological: Negative.   Psychiatric/Behavioral: Negative.     Per HPI unless specifically indicated above     Objective:    BP 125/66   Pulse 85   Temp 98.1 F (36.7 C) (Oral)   Ht 5' 2.99" (1.6 m)   Wt 289 lb (131.1 kg)   SpO2 96%   BMI 51.21 kg/m   Wt Readings from Last 3 Encounters:  01/06/23 289 lb (131.1 kg)  11/20/22 288 lb 9.6 oz (130.9 kg)  07/23/22 292 lb (132.5 kg)    Physical Exam Vitals and nursing note reviewed.  Constitutional:      General: She is awake. She is not in acute distress.    Appearance: She is well-developed and well-groomed. She is obese. She is not ill-appearing or toxic-appearing.  HENT:     Head: Normocephalic.     Right Ear: Hearing normal.     Left Ear: Hearing normal.  Eyes:     General: Lids are  normal.        Right eye: No discharge.        Left eye: No discharge.     Conjunctiva/sclera: Conjunctivae normal.     Pupils: Pupils are equal, round, and reactive to light.  Neck:     Thyroid: No thyromegaly.     Vascular: No carotid bruit.  Cardiovascular:     Rate and Rhythm: Normal rate and regular rhythm.     Heart sounds: Normal heart sounds. No murmur heard.    No gallop.  Pulmonary:     Effort: Pulmonary effort is normal. No accessory muscle usage or respiratory distress.     Breath sounds: Normal breath sounds.  Abdominal:     General: Bowel sounds are normal.     Palpations: Abdomen is soft. There is no hepatomegaly.     Tenderness: There is no abdominal  tenderness.  Musculoskeletal:     Cervical back: Normal range of motion and neck supple.     Lumbar back: Tenderness present. No swelling. Decreased range of motion. Negative right straight leg raise test and negative left straight leg raise test.     Right lower leg: 2+ Edema present.     Left lower leg: 2+ Edema present.  Skin:    General: Skin is warm and dry.  Neurological:     Mental Status: She is alert and oriented to person, place, and time.  Psychiatric:        Attention and Perception: Attention normal.        Mood and Affect: Mood normal.        Speech: Speech normal.        Behavior: Behavior normal. Behavior is cooperative.        Thought Content: Thought content normal.    Diabetic Foot Exam - Simple   Simple Foot Form Visual Inspection See comments: Yes Sensation Testing Intact to touch and monofilament testing bilaterally: Yes Pulse Check Posterior Tibialis and Dorsalis pulse intact bilaterally: Yes Comments Edema bilateral     Results for orders placed or performed in visit on 11/20/22  Bayer DCA Hb A1c Waived  Result Value Ref Range   HB A1C (BAYER DCA - WAIVED) 6.5 (H) 4.8 - 5.6 %  CBC with Differential/Platelet  Result Value Ref Range   WBC 6.8 3.4 - 10.8 x10E3/uL   RBC 4.89 3.77 - 5.28 x10E6/uL   Hemoglobin 14.1 11.1 - 15.9 g/dL   Hematocrit 14.7 82.9 - 46.6 %   MCV 90 79 - 97 fL   MCH 28.8 26.6 - 33.0 pg   MCHC 31.9 31.5 - 35.7 g/dL   RDW 56.2 13.0 - 86.5 %   Platelets 239 150 - 450 x10E3/uL   Neutrophils 71 Not Estab. %   Lymphs 20 Not Estab. %   Monocytes 6 Not Estab. %   Eos 2 Not Estab. %   Basos 1 Not Estab. %   Neutrophils Absolute 4.9 1.4 - 7.0 x10E3/uL   Lymphocytes Absolute 1.3 0.7 - 3.1 x10E3/uL   Monocytes Absolute 0.4 0.1 - 0.9 x10E3/uL   EOS (ABSOLUTE) 0.1 0.0 - 0.4 x10E3/uL   Basophils Absolute 0.0 0.0 - 0.2 x10E3/uL   Immature Granulocytes 0 Not Estab. %   Immature Grans (Abs) 0.0 0.0 - 0.1 x10E3/uL  Comprehensive  metabolic panel  Result Value Ref Range   Glucose 105 (H) 70 - 99 mg/dL   BUN 16 8 - 27 mg/dL   Creatinine, Ser 7.84 0.57 - 1.00 mg/dL  eGFR 92 >59 mL/min/1.73   BUN/Creatinine Ratio 23 12 - 28   Sodium 142 134 - 144 mmol/L   Potassium 3.4 (L) 3.5 - 5.2 mmol/L   Chloride 98 96 - 106 mmol/L   CO2 27 20 - 29 mmol/L   Calcium 9.4 8.7 - 10.3 mg/dL   Total Protein 6.8 6.0 - 8.5 g/dL   Albumin 4.3 3.8 - 4.8 g/dL   Globulin, Total 2.5 1.5 - 4.5 g/dL   Albumin/Globulin Ratio 1.7 1.2 - 2.2   Bilirubin Total 0.8 0.0 - 1.2 mg/dL   Alkaline Phosphatase 148 (H) 44 - 121 IU/L   AST 14 0 - 40 IU/L   ALT 11 0 - 32 IU/L  Lipid Panel w/o Chol/HDL Ratio  Result Value Ref Range   Cholesterol, Total 196 100 - 199 mg/dL   Triglycerides 401 (H) 0 - 149 mg/dL   HDL 54 >02 mg/dL   VLDL Cholesterol Cal 27 5 - 40 mg/dL   LDL Chol Calc (NIH) 725 (H) 0 - 99 mg/dL  TSH  Result Value Ref Range   TSH 2.390 0.450 - 4.500 uIU/mL  Uric acid  Result Value Ref Range   Uric Acid 7.9 3.1 - 7.9 mg/dL  VITAMIN D 25 Hydroxy (Vit-D Deficiency, Fractures)  Result Value Ref Range   Vit D, 25-Hydroxy 30.5 30.0 - 100.0 ng/mL  T4, free  Result Value Ref Range   Free T4 1.43 0.82 - 1.77 ng/dL      Assessment & Plan:   Problem List Items Addressed This Visit       Endocrine   Type 2 diabetes mellitus with obesity (HCC) - Primary    A1c 6.5% in June, trend down from 6.8%. Discussed at length with her.  She does not want to start medication, which to focus on diet reducing carbohydrate and sugar + increasing chair exercises.  Will trial this, if elevated then consider a GLP1, which may offer benefit to weight and A1c.  Urine ALB today.      Relevant Orders   Microalbumin, Urine Waived     Other   Chronic back pain    Chronic pain to back and hips.  Discussed at length with patient.  Will send in 30 day supply only of Tramadol to use sparingly to help with rest.  Discussed if ongoing pain management needed would  place referral to pain management, she prefers to avoid injections, although this may be beneficial and PT in home.  Tizanidine for muscle pain sent in.  Return in 4 weeks for follow-up.        Relevant Medications   tiZANidine (ZANAFLEX) 4 MG tablet   traMADol (ULTRAM) 50 MG tablet   Morbid obesity (HCC)    BMI 51.21.  Recommended eating smaller high protein, low fat meals more frequently and exercising 30 mins a day 5 times a week with a goal of 10-15lb weight loss in the next 3 months. Patient voiced their understanding and motivation to adhere to these recommendations.         Follow up plan: Return in about 4 weeks (around 02/03/2023) for BACK PAIN.

## 2023-01-08 ENCOUNTER — Telehealth: Payer: Self-pay | Admitting: Nurse Practitioner

## 2023-01-08 NOTE — Telephone Encounter (Addendum)
Pt stated she wanted to ask PCP if she was notified that she had to clear medication traMADol (ULTRAM) 50 MG tablet with Medicare for the pharmacy to be able to fill. Mentioned insurance only cleared 14 tablets.    Please advise.

## 2023-01-09 NOTE — Telephone Encounter (Signed)
Attempted PA for 30 day supply on Cover My Meds. Came back stating that medication is available without authorization.

## 2023-01-13 ENCOUNTER — Telehealth (INDEPENDENT_AMBULATORY_CARE_PROVIDER_SITE_OTHER): Payer: Self-pay

## 2023-01-13 ENCOUNTER — Other Ambulatory Visit: Payer: Self-pay | Admitting: Nurse Practitioner

## 2023-01-13 ENCOUNTER — Ambulatory Visit: Payer: Self-pay

## 2023-01-13 MED ORDER — TIZANIDINE HCL 2 MG PO TABS: 1.0000 mg | ORAL_TABLET | Freq: Four times a day (QID) | ORAL | 1 refills | Status: DC | PRN

## 2023-01-13 NOTE — Telephone Encounter (Signed)
  Chief Complaint: tizanidine too strong" Symptoms: stated that she took 1/2 tramadol and 1 tizanidine and felt woozy and then had a "full blown " panic attack and called EMS. Pt stated she wants to be on a muscle relaxer that is not as strong.  Frequency: Sat Pertinent Negatives: Patient denies sx is alert panic Disposition: [] ED /[] Urgent Care (no appt availability in office) / [] Appointment(In office/virtual)/ []  [place] [facility]/ [] Home Care/ [] Refused Recommended Disposition /[] [place] [facility]/ [x]  Follow-up with PCP Additional Notes: to PCP to review Reason for Disposition  Prescription request for new medicine (not a refill)  Answer Assessment - Initial Assessment Questions 1. NAME of MEDICINE: "What medicine(s) are you calling about?"     tizanidine 2. QUESTION: "What is your question?" (e.g., double dose of medicine, side effect)     Too strong that is not - took 1/2 tramadol and 1 tizanidine felt anxious had a panic attack called EMS and stated that EMS said that tizanidine made her too woozy. 3. PRESCRIBER: "Who prescribed the medicine?" Reason: if prescribed by specialist, call should be referred to that group.     PCP 4. SYMPTOMS: "Do you have any symptoms?" If Yes, ask: "What symptoms are you having?"  "How bad are the symptoms (e.g., mild, moderate, severe)     See above  Protocols used: Medication Question Call-A-AH

## 2023-01-13 NOTE — Telephone Encounter (Signed)
Pt called saying the medication that she was given for pain last week is too strong. . Tizanideine 4 mg  she may need something else    Left message to call back.

## 2023-01-13 NOTE — Telephone Encounter (Signed)
Pt has been rescheduled. 

## 2023-01-14 ENCOUNTER — Ambulatory Visit: Payer: Self-pay

## 2023-01-14 NOTE — Telephone Encounter (Signed)
Message from Phill Myron sent at 01/14/2023 11:45 AM EDT  Summary: Medication question   Question Medication dosage tiZANidine (ZANAFLEX).... patient states she was given a 4mg  dosage instead of 2mg .         Chief Complaint: pt concerned wrong dose was ordered  Disposition: [] ED /[] Urgent Care (no appt availability in office) / [] Appointment(In office/virtual)/ []  [place] [facility]/ [x] Home Care/ [] Refused Recommended Disposition /[] [place] [facility]/ []  Follow-up with PCP Additional Notes: called pharmacy and was advised 2 mg was called for pt to take 1/2 of tablet to equal 1 mg. Advised of note from Aura Dials NP dated 01/14/23.  Reason for Disposition  Pharmacy calling with prescription question and triager answers question  Answer Assessment - Initial Assessment Questions 1. NAME of MEDICINE: "What medicine(s) are you calling about?"     tizanidine 2. QUESTION: "What is your question?" (e.g., double dose of medicine, side effect)     Concerned 4 mg was called in instead of 2 mg  3. PRESCRIBER: "Who prescribed the medicine?" Reason: if prescribed by specialist, call should be referred to that group.     PCP  Protocols used: Medication Question Call-A-AH

## 2023-01-14 NOTE — Telephone Encounter (Signed)
Patient made aware of Provider's recommendations and verbalized understanding.   

## 2023-01-15 ENCOUNTER — Telehealth: Payer: Self-pay | Admitting: Nurse Practitioner

## 2023-01-15 NOTE — Telephone Encounter (Signed)
Reviewed patient's chart. Medication was discontinued from patient's chart and not sent in. Maybe the pharmacy had a previous prescription on file and filled it. Is it ok to call the pharmacy and cancel all prescriptions for the Lyrica?

## 2023-01-15 NOTE — Telephone Encounter (Signed)
Patient called stated her son went to get her medication on 7/29 from [facility] and he came back with pregabalin (LYRICA) 25 MG capsule which was discontinued on 7/22. She feels uncomfortable at the fact she was given medication she is no longer on. Please f/u with patient

## 2023-01-16 NOTE — Telephone Encounter (Signed)
Called and cancelled all Lyrica prescriptions at the pharmacy.   Called and notified patient that we did not send this in and that all prescriptions have been cancelled at the pharmacy for this medication.

## 2023-01-21 ENCOUNTER — Ambulatory Visit (INDEPENDENT_AMBULATORY_CARE_PROVIDER_SITE_OTHER): Payer: Medicare HMO | Admitting: Nurse Practitioner

## 2023-02-04 ENCOUNTER — Ambulatory Visit: Payer: Medicare HMO | Admitting: Nurse Practitioner

## 2023-02-13 ENCOUNTER — Ambulatory Visit (INDEPENDENT_AMBULATORY_CARE_PROVIDER_SITE_OTHER): Payer: Medicare HMO | Admitting: Nurse Practitioner

## 2023-05-06 ENCOUNTER — Other Ambulatory Visit: Payer: Self-pay | Admitting: Nurse Practitioner

## 2023-05-08 NOTE — Telephone Encounter (Signed)
Requested Prescriptions  Pending Prescriptions Disp Refills   esomeprazole (NEXIUM) 40 MG capsule [Pharmacy Med Name: ESOMEPRAZOLE MAGNESIUM 40MG  DR CAPS] 90 capsule 2    Sig: TAKE 1 CAPSULE BY MOUTH DAILY     Gastroenterology: Proton Pump Inhibitors 2 Passed - 05/06/2023  4:50 PM      Passed - ALT in normal range and within 360 days    ALT  Date Value Ref Range Status  11/20/2022 11 0 - 32 IU/L Final         Passed - AST in normal range and within 360 days    AST  Date Value Ref Range Status  11/20/2022 14 0 - 40 IU/L Final         Passed - Valid encounter within last 12 months    Recent Outpatient Visits           4 months ago Type 2 diabetes mellitus with obesity (HCC)   [place] [facility] [place], [place] T, NP   5 months ago Morbid obesity (HCC)   [place] [facility] [place], Corrie Dandy T, NP   1 year ago Morbid obesity (HCC)   [place] [facility] [place], Corrie Dandy T, NP   1 year ago Morbid obesity (HCC)   [place] [facility] [place], Corrie Dandy T, NP   2 years ago Mixed hyperlipidemia   [place] [facility] [place], Dorie Rank, NP       Future Appointments             In 2 weeks Cannady, Dorie Rank, NP [place] [facility], [facility]

## 2023-05-09 ENCOUNTER — Other Ambulatory Visit: Payer: Self-pay | Admitting: Nurse Practitioner

## 2023-05-09 NOTE — Telephone Encounter (Signed)
Medication Refill -  Most Recent Primary Care Visit:  Provider: Aura Dials T  Department: [facility]  Visit Type: OFFICE VISIT  Date: 01/06/2023  Medication: esomeprazole (NEXIUM) 40 MG capsule [[id number]]   Pt is requesting if medication can be resent? Pharmacy is stating that they have not received the script  Has the patient contacted their pharmacy? No (Agent: If no, request that the patient contact the pharmacy for the refill. If patient does not wish to contact the pharmacy document the reason why and proceed with request.) (Agent: If yes, when and what did the pharmacy advise?)  Is this the correct pharmacy for this prescription? Yes If no, delete pharmacy and type the correct one.  This is the patient's preferred pharmacy:   [email or web address] - [place], [place] - [address] [place] [address] Phone: [phone] Fax: [phone]   Has the prescription been filled recently? Yes  Is the patient out of the medication? Yes  Has the patient been seen for an appointment in the last year OR does the patient have an upcoming appointment? Yes  Can we respond through MyChart? No  Agent: Please be advised that Rx refills may take up to 3 business days. We ask that you follow-up with your pharmacy.

## 2023-05-09 NOTE — Telephone Encounter (Signed)
Requested Prescriptions  Refused Prescriptions Disp Refills   esomeprazole (NEXIUM) 40 MG capsule 90 capsule 2    Sig: Take 1 capsule (40 mg total) by mouth daily.     Gastroenterology: Proton Pump Inhibitors 2 Passed - 05/09/2023  5:35 PM      Passed - ALT in normal range and within 360 days    ALT  Date Value Ref Range Status  11/20/2022 11 0 - 32 IU/L Final         Passed - AST in normal range and within 360 days    AST  Date Value Ref Range Status  11/20/2022 14 0 - 40 IU/L Final         Passed - Valid encounter within last 12 months    Recent Outpatient Visits           4 months ago Type 2 diabetes mellitus with obesity (HCC)   [place] [facility] [place], [place] T, NP   5 months ago Morbid obesity (HCC)   [place] [facility] [place], Corrie Dandy T, NP   1 year ago Morbid obesity (HCC)   [place] [facility] [place], Corrie Dandy T, NP   1 year ago Morbid obesity (HCC)   [place] [facility] [place], Corrie Dandy T, NP   2 years ago Mixed hyperlipidemia   [place] [facility] [place], Dorie Rank, NP       Future Appointments             In 2 weeks Cannady, Dorie Rank, NP  [facility], [facility]

## 2023-05-17 NOTE — Patient Instructions (Incomplete)

## 2023-05-19 ENCOUNTER — Ambulatory Visit: Payer: Self-pay

## 2023-05-19 NOTE — Telephone Encounter (Signed)
Chief Complaint: Want to cancel appointment on 05/23/23 due to unable to get in and out of son's car Symptoms: No new symptoms, swelling is not any worse than when seen last  Disposition: [] ED /[] Urgent Care (no appt availability in office) / [] Appointment(In office/virtual)/ []  [place] [facility]/ [] Home Care/ [] Refused Recommended Disposition /[] [place] [facility]/ [x]  Follow-up with PCP Additional Notes: Patient advised it's important to keep f/u appointments for medication refills as well. She says she understands and as soon as her son finds adequate transportation, she will call and reschedule the appointment. She says her legs are too heavy to lift and get in and out of the car they currently have. She wants Jolene to be aware. Advised I will send this to her, appointment canceled per patient request.    Summary: legs are very heavy with fluid   Pt stated her legs are very heavy with fluid. Stated feels like it pulls on knee and hip when getting in and out of car.  Stated she cannot come into the office on 12/06 (pt is asking for the appointment to be canceled; left appointment for nurse to decide). Stated her son has a truck and she cannot get in the car.She stated she has a vein & vascular doctor but cannot make it to their appointments either.  Seeking clinical advice.       Reason for Disposition  Health Information question, no triage required and triager able to answer question  Answer Assessment - Initial Assessment Questions 1. REASON FOR CALL or QUESTION: "What is your reason for calling today?" or "How can I best help you?" or "What question do you have that I can help answer?"     Need to cancel appointment due to unable to get in and out of the car  Protocols used: Information Only Call - No Triage-A-AH

## 2023-05-20 NOTE — Telephone Encounter (Signed)
Called patient, she stated that she would call when she can find transportation.  I did advise her that her provider stated that she needs to reschedule as soon as she can.  Pt verbalized understanding.

## 2023-05-20 NOTE — Telephone Encounter (Signed)
Noted  

## 2023-05-23 ENCOUNTER — Ambulatory Visit: Payer: Medicare HMO | Admitting: Nurse Practitioner

## 2023-05-23 DIAGNOSIS — E1169 Type 2 diabetes mellitus with other specified complication: Secondary | ICD-10-CM

## 2023-05-23 DIAGNOSIS — E039 Hypothyroidism, unspecified: Secondary | ICD-10-CM

## 2023-05-23 DIAGNOSIS — R030 Elevated blood-pressure reading, without diagnosis of hypertension: Secondary | ICD-10-CM

## 2023-06-04 ENCOUNTER — Encounter: Payer: Self-pay | Admitting: Nurse Practitioner

## 2023-06-04 NOTE — Telephone Encounter (Signed)
 Care team updated and letter sent for eye exam notes.

## 2023-07-03 ENCOUNTER — Other Ambulatory Visit: Payer: Self-pay | Admitting: Nurse Practitioner

## 2023-07-03 NOTE — Telephone Encounter (Signed)
Medication Refill -  Most Recent Primary Care Visit:  Provider: Aura Dials T  Department: [facility]  Visit Type: OFFICE VISIT  Date: 01/06/2023  Medication: torsemide (DEMADEX) 100 MG tablet   Has the patient contacted their pharmacy? No (Agent: If no, request that the patient contact the pharmacy for the refill. If patient does not wish to contact the pharmacy document the reason why and proceed with request.) (Agent: If yes, when and what did the pharmacy advise?)  Is this the correct pharmacy for this prescription? No If no, delete pharmacy and type the correct one.  This is the patient's preferred pharmacy:  [email or web address] - [place], [place] - [address] [place] [address] Phone: [phone] Fax: [phone]   Has the prescription been filled recently? No  Is the patient out of the medication? Yes  Has the patient been seen for an appointment in the last year OR does the patient have an upcoming appointment? Yes  Can we respond through MyChart? Yes  Agent: Please be advised that Rx refills may take up to 3 business days. We ask that you follow-up with your pharmacy.

## 2023-07-04 MED ORDER — TORSEMIDE 100 MG PO TABS: 100.0000 mg | ORAL_TABLET | Freq: Every day | ORAL | 1 refills | Status: DC

## 2023-07-04 NOTE — Telephone Encounter (Signed)
Requested medication (s) are due for refill today: expired medication date  Requested medication (s) are on the active medication list: yes   Last refill:  05/01/22 #90 4 refills   Future visit scheduled: yes in 3 weeks   Notes to clinic:  expired medication date protocol failed last labs 11/20/22. Do you want to renew Rx?     Requested Prescriptions  Pending Prescriptions Disp Refills   torsemide (DEMADEX) 100 MG tablet 90 tablet 4    Sig: Take 1 tablet (100 mg total) by mouth daily.     Cardiovascular:  Diuretics - Loop Failed - 07/04/2023  9:20 AM      Failed - K in normal range and within 180 days    Potassium  Date Value Ref Range Status  11/20/2022 3.4 (L) 3.5 - 5.2 mmol/L Final         Failed - Ca in normal range and within 180 days    Calcium  Date Value Ref Range Status  11/20/2022 9.4 8.7 - 10.3 mg/dL Final         Failed - Na in normal range and within 180 days    Sodium  Date Value Ref Range Status  11/20/2022 142 134 - 144 mmol/L Final         Failed - Cr in normal range and within 180 days    Creatinine, Ser  Date Value Ref Range Status  11/20/2022 0.70 0.57 - 1.00 mg/dL Final         Failed - Cl in normal range and within 180 days    Chloride  Date Value Ref Range Status  11/20/2022 98 96 - 106 mmol/L Final         Failed - Mg Level in normal range and within 180 days    Magnesium  Date Value Ref Range Status  05/01/2022 1.9 1.6 - 2.3 mg/dL Final         Passed - Last BP in normal range    BP Readings from Last 1 Encounters:  01/06/23 125/66         Passed - Valid encounter within last 6 months    Recent Outpatient Visits           5 months ago Type 2 diabetes mellitus with obesity (HCC)   [place] [facility] [place], Corrie Dandy T, NP   7 months ago Morbid obesity (HCC)   [place] [facility] [place], Corrie Dandy T, NP   1 year ago Morbid obesity (HCC)   [place] [facility] [place], Dorie Rank, NP    1 year ago Morbid obesity (HCC)   [place] [facility] [place], Corrie Dandy T, NP   2 years ago Mixed hyperlipidemia    [facility] [place], Dorie Rank, NP       Future Appointments             In 3 weeks Cannady, Dorie Rank, NP  [facility], [facility]

## 2023-07-05 ENCOUNTER — Other Ambulatory Visit: Payer: Self-pay | Admitting: Nurse Practitioner

## 2023-07-07 ENCOUNTER — Other Ambulatory Visit: Payer: Self-pay | Admitting: Nurse Practitioner

## 2023-07-07 NOTE — Telephone Encounter (Signed)
Disp Refills Start End   torsemide (DEMADEX) 100 MG tablet 90 tablet 1 07/04/2023 --   Sig - Route: Take 1 tablet (100 mg total) by mouth daily. - Oral   Sent to pharmacy as: torsemide (DEMADEX) 100 MG tablet   E-Prescribing Status: Receipt confirmed by pharmacy (07/04/2023 11:36 AM EST)     Requested Prescriptions  Pending Prescriptions Disp Refills   torsemide (DEMADEX) 100 MG tablet [Pharmacy Med Name: TORSEMIDE 100MG  (HUNDRED MG) TABLET] 90 tablet 1    Sig: TAKE 1 TABLET BY MOUTH DAILY     Cardiovascular:  Diuretics - Loop Failed - 07/07/2023 11:11 AM      Failed - K in normal range and within 180 days    Potassium  Date Value Ref Range Status  11/20/2022 3.4 (L) 3.5 - 5.2 mmol/L Final         Failed - Ca in normal range and within 180 days    Calcium  Date Value Ref Range Status  11/20/2022 9.4 8.7 - 10.3 mg/dL Final         Failed - Na in normal range and within 180 days    Sodium  Date Value Ref Range Status  11/20/2022 142 134 - 144 mmol/L Final         Failed - Cr in normal range and within 180 days    Creatinine, Ser  Date Value Ref Range Status  11/20/2022 0.70 0.57 - 1.00 mg/dL Final         Failed - Cl in normal range and within 180 days    Chloride  Date Value Ref Range Status  11/20/2022 98 96 - 106 mmol/L Final         Failed - Mg Level in normal range and within 180 days    Magnesium  Date Value Ref Range Status  05/01/2022 1.9 1.6 - 2.3 mg/dL Final         Failed - Valid encounter within last 6 months    Recent Outpatient Visits           6 months ago Type 2 diabetes mellitus with obesity (HCC)   [place] [facility] [place], Corrie Dandy T, NP   7 months ago Morbid obesity (HCC)   [place] [facility] [place], Corrie Dandy T, NP   1 year ago Morbid obesity (HCC)   [place] [facility] [place], Corrie Dandy T, NP   1 year ago Morbid obesity (HCC)   [place] [facility] [place], Corrie Dandy T, NP   2  years ago Mixed hyperlipidemia   [place] [facility] [place], Dorie Rank, NP       Future Appointments             In 3 weeks [place], [place] T, NP [facility]            Passed - Last BP in normal range    BP Readings from Last 1 Encounters:  01/06/23 125/66

## 2023-07-08 NOTE — Telephone Encounter (Signed)
Requested Prescriptions  Refused Prescriptions Disp Refills   torsemide (DEMADEX) 100 MG tablet [Pharmacy Med Name: TORSEMIDE 100MG  (HUNDRED MG) TABLET] 90 tablet 1    Sig: TAKE 1 TABLET BY MOUTH DAILY     Cardiovascular:  Diuretics - Loop Failed - 07/08/2023  8:12 AM      Failed - K in normal range and within 180 days    Potassium  Date Value Ref Range Status  11/20/2022 3.4 (L) 3.5 - 5.2 mmol/L Final         Failed - Ca in normal range and within 180 days    Calcium  Date Value Ref Range Status  11/20/2022 9.4 8.7 - 10.3 mg/dL Final         Failed - Na in normal range and within 180 days    Sodium  Date Value Ref Range Status  11/20/2022 142 134 - 144 mmol/L Final         Failed - Cr in normal range and within 180 days    Creatinine, Ser  Date Value Ref Range Status  11/20/2022 0.70 0.57 - 1.00 mg/dL Final         Failed - Cl in normal range and within 180 days    Chloride  Date Value Ref Range Status  11/20/2022 98 96 - 106 mmol/L Final         Failed - Mg Level in normal range and within 180 days    Magnesium  Date Value Ref Range Status  05/01/2022 1.9 1.6 - 2.3 mg/dL Final         Failed - Valid encounter within last 6 months    Recent Outpatient Visits           6 months ago Type 2 diabetes mellitus with obesity (HCC)   [place] [facility] [place], Corrie Dandy T, NP   7 months ago Morbid obesity (HCC)   [place] [facility] [place], Corrie Dandy T, NP   1 year ago Morbid obesity (HCC)   [place] [facility] [place], Corrie Dandy T, NP   1 year ago Morbid obesity (HCC)   [place] [facility] [place], Corrie Dandy T, NP   2 years ago Mixed hyperlipidemia   [place] [facility] [place], Dorie Rank, NP       Future Appointments             In 3 weeks [place], [place] T, NP [place] [facility]            Passed - Last BP in normal range    BP Readings from Last 1  Encounters:  01/06/23 125/66

## 2023-07-27 NOTE — Patient Instructions (Signed)

## 2023-07-28 ENCOUNTER — Other Ambulatory Visit: Payer: Self-pay | Admitting: Nurse Practitioner

## 2023-07-29 NOTE — Telephone Encounter (Signed)
Requested Prescriptions  Pending Prescriptions Disp Refills   levothyroxine (SYNTHROID) 75 MCG tablet [Pharmacy Med Name: LEVOTHYROXINE 0.075MG  ( ) TABS] 90 tablet 1    Sig: TAKE 1 TABLET BY MOUTH DAILY BEFORE BREAKFAST     Endocrinology:  Hypothyroid Agents Passed - 07/29/2023  4:00 PM      Passed - TSH in normal range and within 360 days    TSH  Date Value Ref Range Status  11/20/2022 2.390 0.450 - 4.500 uIU/mL Final         Passed - Valid encounter within last 12 months    Recent Outpatient Visits           6 months ago Type 2 diabetes mellitus with obesity (HCC)   [place] [facility] [place], [place] T, NP   8 months ago Morbid obesity (HCC)   [place] [facility] [place], Corrie Dandy T, NP   1 year ago Morbid obesity (HCC)   [place] [facility] [place], Corrie Dandy T, NP   1 year ago Morbid obesity (HCC)   [place] [facility] [place], Corrie Dandy T, NP   2 years ago Mixed hyperlipidemia   [place] [facility] [place], Dorie Rank, NP       Future Appointments             Tomorrow Marjie Skiff, NP [facility]

## 2023-07-30 ENCOUNTER — Ambulatory Visit (INDEPENDENT_AMBULATORY_CARE_PROVIDER_SITE_OTHER): Payer: Medicare Other | Admitting: Nurse Practitioner

## 2023-07-30 ENCOUNTER — Encounter: Payer: Self-pay | Admitting: Nurse Practitioner

## 2023-07-30 VITALS — BP 136/82 | HR 92 | Temp 97.7°F | Ht 63.0 in | Wt 289.6 lb

## 2023-07-30 DIAGNOSIS — E039 Hypothyroidism, unspecified: Secondary | ICD-10-CM

## 2023-07-30 DIAGNOSIS — E785 Hyperlipidemia, unspecified: Secondary | ICD-10-CM | POA: Diagnosis not present

## 2023-07-30 DIAGNOSIS — G8929 Other chronic pain: Secondary | ICD-10-CM

## 2023-07-30 DIAGNOSIS — M5441 Lumbago with sciatica, right side: Secondary | ICD-10-CM

## 2023-07-30 DIAGNOSIS — M1A0710 Idiopathic chronic gout, right ankle and foot, without tophus (tophi): Secondary | ICD-10-CM

## 2023-07-30 DIAGNOSIS — I890 Lymphedema, not elsewhere classified: Secondary | ICD-10-CM

## 2023-07-30 DIAGNOSIS — R030 Elevated blood-pressure reading, without diagnosis of hypertension: Secondary | ICD-10-CM | POA: Diagnosis not present

## 2023-07-30 DIAGNOSIS — E1169 Type 2 diabetes mellitus with other specified complication: Secondary | ICD-10-CM

## 2023-07-30 DIAGNOSIS — E669 Obesity, unspecified: Secondary | ICD-10-CM | POA: Diagnosis not present

## 2023-07-30 LAB — BAYER DCA HB A1C WAIVED: HB A1C (BAYER DCA - WAIVED): 6.4 % — ABNORMAL HIGH (ref 4.8–5.6)

## 2023-07-30 MED ORDER — ERGOCALCIFEROL 1.25 MG (50000 UT) PO CAPS: 50000.0000 [IU] | ORAL_CAPSULE | ORAL | 4 refills | Status: AC

## 2023-07-30 MED ORDER — ACCU-CHEK GUIDE W/DEVICE KIT: PACK | 0 refills | Status: AC

## 2023-07-30 MED ORDER — GLUCOSE BLOOD VI STRP: ORAL_STRIP | 12 refills | Status: AC

## 2023-07-30 MED ORDER — ACCU-CHEK FASTCLIX LANCETS MISC: 1.0000 | Freq: Every day | 3 refills | Status: AC

## 2023-07-30 MED ORDER — BETAMETHASONE DIPROPIONATE AUG 0.05 % EX CREA: 1.0000 | TOPICAL_CREAM | Freq: Two times a day (BID) | CUTANEOUS | 4 refills | Status: AC | PRN

## 2023-07-30 MED ORDER — TORSEMIDE 100 MG PO TABS: 100.0000 mg | ORAL_TABLET | Freq: Every day | ORAL | 2 refills | Status: DC

## 2023-07-30 MED ORDER — LEVOTHYROXINE SODIUM 75 MCG PO TABS: 75.0000 ug | ORAL_TABLET | Freq: Every day | ORAL | 2 refills | Status: DC

## 2023-07-30 NOTE — Assessment & Plan Note (Signed)
Chronic, ongoing.  Continue collaboration with vascular.  At this time continue Torsemide as ordered by previous provider, although limited evidence for this being beneficial in lymphedema. Recommend continue compression wraps at home and take Tylenol 1000 MG TID as needed.

## 2023-07-30 NOTE — Assessment & Plan Note (Signed)
Chronic, ongoing.  Continue current medication regimen and adjust regimen as needed.  Thyroid labs obtained today.

## 2023-07-30 NOTE — Assessment & Plan Note (Signed)
Chronic, ongoing.  Denies any current pain. She continues to not try Allopurinol, but would benefit from this -- discussed at length with her.  May benefit her overall chronic pain issues.

## 2023-07-30 NOTE — Assessment & Plan Note (Signed)
Chronic pain to back and hips, worsening back pain.  Discussed at length with patient.  No benefit from Gabapentin, Lyrica, Tizanidine, Tramadol, or OTC treatment. Pain is complicated for current BMI and mobility + lymphedema.  She would like to see pain management for recommendations, referral placed.

## 2023-07-30 NOTE — Assessment & Plan Note (Signed)
Chronic, stable.  BP at goal in office today.  Recommend she monitor BP at least a few mornings a week at home and document.  DASH diet at home.  Continue current medication regimen and adjust as needed.  Labs today: CMP and lipid.

## 2023-07-30 NOTE — Progress Notes (Signed)
A1c has trended down today to 6.4%, holidays did not effect you!!  Good news!!

## 2023-07-30 NOTE — Assessment & Plan Note (Signed)
A1c 6.4% today, trend down from 6.5%. Discussed at length with her.  She does not want to start medication, wishes to continue focus on diet reducing carbohydrate and sugar + increasing chair exercises.  Will trial this, if elevated then consider a GLP1, which may offer benefit to weight and A1c.  Urine ALB ordered, she will obtain at home and bring to office. - No ACE/ARB or statin - refuses - Refuses vaccinations - Needs eye exam.  Foot exam up to date.

## 2023-07-30 NOTE — Assessment & Plan Note (Signed)
Chronic, ongoing.  Continue current medication regimen and adjust as needed.  Lipid panel and CMP today.

## 2023-07-30 NOTE — Progress Notes (Signed)
BP 136/82   Pulse 92   Temp 97.7 F (36.5 C) (Oral)   Ht 5\' 3"  (1.6 m)   Wt 289 lb 9.6 oz (131.4 kg)   SpO2 97%   BMI 51.30 kg/m    Subjective:    Patient ID: Michelle Marks, female    DOB: [birth year], 73 y.o.   MRN: [id number]  HPI: Michelle Marks is a 73 y.o. female  Chief Complaint  Patient presents with   Diabetes    Eye exam requested from Dr. Clydene Pugh.    Hyperlipidemia   Hypothyroidism   DIABETES Last A1c June was 6.5%, no current medications. Hypoglycemic episodes:no Polydipsia/polyuria: no Visual disturbance: no Chest pain: no Paresthesias: no Glucose Monitoring: no  Accucheck frequency: Not Checking  Fasting glucose:  Post prandial:  Evening:  Before meals: Taking Insulin?: no  Long acting insulin:  Short acting insulin: Blood Pressure Monitoring: not checking Retinal Examination: Up To Date Foot Exam: Up to Date Diabetic Education: Not Completed Pneumovax: Up to Date Influenza: Up to Date Aspirin: no   HYPERTENSION / HYPERLIPIDEMIA Taking Torsemide 100 MG daily.   Satisfied with current treatment? yes Duration of hypertension: chronic BP monitoring frequency: not checking BP range:  BP medication side effects: no Duration of hyperlipidemia: chronic Cholesterol medication side effects: no Cholesterol supplements: none Medication compliance: good compliance Aspirin: no Recent stressors: no Recurrent headaches: no Visual changes: no Palpitations: no Dyspnea: no Chest pain: no Lower extremity edema: at baseline Dizzy/lightheaded: no   HYPOTHYROIDISM Taking Levothyroxine 75 MCG daily. Thyroid control status:stable Satisfied with current treatment? yes Medication side effects: no Medication compliance: good compliance Etiology of hypothyroidism: unknown Recent dose adjustment:no Fatigue: no Cold intolerance: no Heat intolerance: no Weight gain: no Weight loss: no Constipation: no Diarrhea/loose stools: no Palpitations:  no Lower extremity edema: yes Anxiety/depressed mood: no   BACK PAIN Reports her hip pain is now better, but lower back pain has become worse.  Has underlying lymphedema, did go to vascular last 07/23/22. No current pumps, but wears compression wraps.  She would like to attend pain management.  Tramadol did not offer benefit.  In past did try Gabapentin, Lyrica, Tizanidine all without benefit to pain.  Does have elevation of uric acid on labs, did not try Allopurinol.  MRI lumbar spine 2006 did noted lateral disc extrusion L5-S1 with nerve root compression + degenerative changes to multiple areas and ligamentum flavum.   Duration: weeks Mechanism of injury: unknown Location: bilateral and low back Onset: gradual Severity: 25/10 Quality: sharp, shooting, and throbbing Frequency: constant Radiation: buttocks Aggravating factors: lying down Alleviating factors: nothing at present Status: fluctuating Treatments attempted: Tramadol, Gabapentin, Lyrica, Tylenol, Hempvana Relief with NSAIDs?: No NSAIDs Taken Nighttime pain:  no Paresthesias / decreased sensation:  no Bowel / bladder incontinence:  no Fevers:  no Dysuria / urinary frequency:  no      01/06/2023    1:59 PM 11/20/2022    1:53 PM 07/22/2022    2:46 PM 05/01/2022    1:46 PM 09/25/2021    1:12 PM  Depression screen PHQ 2/9  Decreased Interest 1 0 0 0 0  Down, Depressed, Hopeless 0 0 0 1 0  PHQ - 2 Score 1 0 0 1 0  Altered sleeping 3 3 0 0 2  Tired, decreased energy 3 2 0 0 0  Change in appetite 0 0 0 0 0  Feeling bad or failure about yourself  0 0 0 0 0  Trouble concentrating  0 0 0 0 0  Moving slowly or fidgety/restless 0 0 0 0 0  Suicidal thoughts 0 0 0 0 0  PHQ-9 Score 7 5 0 1 2  Difficult doing work/chores Extremely dIfficult Very difficult Not difficult at all Not difficult at all        01/06/2023    1:59 PM 11/20/2022    1:53 PM 05/01/2022    1:46 PM 09/25/2021    1:13 PM  GAD 7 : Generalized Anxiety Score   Nervous, Anxious, on Edge 2 0 1 0  Control/stop worrying 1 1 0 0  Worry too much - different things 1 0 1 1  Trouble relaxing 0 0 0 0  Restless 0 0 0 0  Easily annoyed or irritable 1 1 0 0  Afraid - awful might happen 1 0 0 1  Total GAD 7 Score 6 2 2 2   Anxiety Difficulty Somewhat difficult Not difficult at all Somewhat difficult Not difficult at all   Relevant past medical, surgical, family and social history reviewed and updated as indicated. Interim medical history since our last visit reviewed. Allergies and medications reviewed and updated.  Review of Systems  Constitutional:  Negative for activity change, appetite change, diaphoresis, fatigue and fever.  Respiratory:  Negative for cough, chest tightness and shortness of breath.   Cardiovascular:  Positive for leg swelling. Negative for chest pain and palpitations.  Gastrointestinal: Negative.   Endocrine: Negative for cold intolerance, heat intolerance, polydipsia, polyphagia and polyuria.  Musculoskeletal:  Positive for arthralgias and back pain.  Neurological: Negative.   Psychiatric/Behavioral: Negative.     Per HPI unless specifically indicated above     Objective:    BP 136/82   Pulse 92   Temp 97.7 F (36.5 C) (Oral)   Ht 5\' 3"  (1.6 m)   Wt 289 lb 9.6 oz (131.4 kg)   SpO2 97%   BMI 51.30 kg/m   Wt Readings from Last 3 Encounters:  07/30/23 289 lb 9.6 oz (131.4 kg)  01/06/23 289 lb (131.1 kg)  11/20/22 288 lb 9.6 oz (130.9 kg)    Physical Exam Vitals and nursing note reviewed.  Constitutional:      General: She is awake. She is not in acute distress.    Appearance: She is well-developed and well-groomed. She is morbidly obese. She is not ill-appearing or toxic-appearing.  HENT:     Head: Normocephalic.     Right Ear: Hearing normal.     Left Ear: Hearing normal.  Eyes:     General: Lids are normal.        Right eye: No discharge.        Left eye: No discharge.     Conjunctiva/sclera: Conjunctivae  normal.     Pupils: Pupils are equal, round, and reactive to light.  Neck:     Thyroid: No thyromegaly.     Vascular: No carotid bruit.  Cardiovascular:     Rate and Rhythm: Normal rate and regular rhythm.     Heart sounds: Normal heart sounds. No murmur heard.    No gallop.  Pulmonary:     Effort: Pulmonary effort is normal. No accessory muscle usage or respiratory distress.     Breath sounds: Normal breath sounds.  Abdominal:     General: Bowel sounds are normal.     Palpations: Abdomen is soft. There is no hepatomegaly.     Tenderness: There is no abdominal tenderness.  Musculoskeletal:     Cervical back: Normal  range of motion and neck supple.     Lumbar back: Tenderness present. No swelling. Decreased range of motion. Negative right straight leg raise test and negative left straight leg raise test.     Right lower leg: 2+ Edema present.     Left lower leg: 2+ Edema present.     Comments: Uses walker to assist with antalgic gait.  Skin:    General: Skin is warm and dry.  Neurological:     Mental Status: She is alert and oriented to person, place, and time.  Psychiatric:        Attention and Perception: Attention normal.        Mood and Affect: Mood normal.        Speech: Speech normal.        Behavior: Behavior normal. Behavior is cooperative.        Thought Content: Thought content normal.    Results for orders placed or performed in visit on 07/30/23  Bayer DCA Hb A1c Waived   Collection Time: 07/30/23  2:50 PM  Result Value Ref Range   HB A1C (BAYER DCA - WAIVED) 6.4 (H) 4.8 - 5.6 %      Assessment & Plan:   Problem List Items Addressed This Visit       Cardiovascular and Mediastinum   White coat syndrome without diagnosis of hypertension   Chronic, stable.  BP at goal in office today.  Recommend she monitor BP at least a few mornings a week at home and document.  DASH diet at home.  Continue current medication regimen and adjust as needed.  Labs today: CMP and  lipid.         Relevant Medications   torsemide (DEMADEX) 100 MG tablet     Endocrine   Hyperlipidemia associated with type 2 diabetes mellitus (HCC)   Chronic, ongoing.  Continue current medication regimen and adjust as needed.  Lipid panel and CMP today.        Relevant Medications   torsemide (DEMADEX) 100 MG tablet   Other Relevant Orders   Bayer DCA Hb A1c Waived (Completed)   Comprehensive metabolic panel   NMR, lipoprofile   Hypothyroidism   Chronic, ongoing.  Continue current medication regimen and adjust regimen as needed.  Thyroid labs obtained today.      Relevant Medications   levothyroxine (SYNTHROID) 75 MCG tablet   Other Relevant Orders   T4, free   TSH   Type 2 diabetes mellitus with obesity (HCC) - Primary   A1c 6.4% today, trend down from 6.5%. Discussed at length with her.  She does not want to start medication, wishes to continue focus on diet reducing carbohydrate and sugar + increasing chair exercises.  Will trial this, if elevated then consider a GLP1, which may offer benefit to weight and A1c.  Urine ALB ordered, she will obtain at home and bring to office. - No ACE/ARB or statin - refuses - Refuses vaccinations - Needs eye exam.  Foot exam up to date.      Relevant Orders   Bayer DCA Hb A1c Waived (Completed)   Comprehensive metabolic panel   Microalbumin, Urine Waived     Musculoskeletal and Integument   Idiopathic chronic gout of right foot without tophus   Chronic, ongoing.  Denies any current pain. She continues to not try Allopurinol, but would benefit from this -- discussed at length with her.  May benefit her overall chronic pain issues.  Other   Chronic back pain   Chronic pain to back and hips, worsening back pain.  Discussed at length with patient.  No benefit from Gabapentin, Lyrica, Tizanidine, Tramadol, or OTC treatment. Pain is complicated for current BMI and mobility + lymphedema.  She would like to see pain management for  recommendations, referral placed.      Relevant Orders   Ambulatory referral to Pain Clinic   Lymphedema in adult patient   Chronic, ongoing.  Continue collaboration with vascular.  At this time continue Torsemide as ordered by previous provider, although limited evidence for this being beneficial in lymphedema. Recommend continue compression wraps at home and take Tylenol 1000 MG TID as needed.        Morbid obesity (HCC)   BMI 51.30.  Recommended eating smaller high protein, low fat meals more frequently and exercising 30 mins a day 5 times a week with a goal of 10-15lb weight loss in the next 3 months. Patient voiced their understanding and motivation to adhere to these recommendations.         Follow up plan: Return in about 6 months (around 01/27/2024) for HTN/HLD, DIABETES , Hypothyroid.

## 2023-07-30 NOTE — Assessment & Plan Note (Signed)
BMI 51.30.  Recommended eating smaller high protein, low fat meals more frequently and exercising 30 mins a day 5 times a week with a goal of 10-15lb weight loss in the next 3 months. Patient voiced their understanding and motivation to adhere to these recommendations.

## 2023-07-31 ENCOUNTER — Ambulatory Visit: Payer: Medicare Other | Admitting: Emergency Medicine

## 2023-07-31 VITALS — Ht 64.0 in | Wt 285.0 lb

## 2023-07-31 DIAGNOSIS — Z0000 Encounter for general adult medical examination without abnormal findings: Secondary | ICD-10-CM

## 2023-07-31 DIAGNOSIS — Z780 Asymptomatic menopausal state: Secondary | ICD-10-CM | POA: Diagnosis not present

## 2023-07-31 DIAGNOSIS — Z1231 Encounter for screening mammogram for malignant neoplasm of breast: Secondary | ICD-10-CM

## 2023-07-31 LAB — COMPREHENSIVE METABOLIC PANEL
ALT: 13 [IU]/L (ref 0–32)
AST: 13 [IU]/L (ref 0–40)
Albumin: 4.2 g/dL (ref 3.8–4.8)
Alkaline Phosphatase: 166 [IU]/L — ABNORMAL HIGH (ref 44–121)
BUN/Creatinine Ratio: 27 (ref 12–28)
BUN: 17 mg/dL (ref 8–27)
Bilirubin Total: 0.6 mg/dL (ref 0.0–1.2)
CO2: 29 mmol/L (ref 20–29)
Calcium: 9.7 mg/dL (ref 8.7–10.3)
Chloride: 98 mmol/L (ref 96–106)
Creatinine, Ser: 0.63 mg/dL (ref 0.57–1.00)
Globulin, Total: 2.6 g/dL (ref 1.5–4.5)
Glucose: 126 mg/dL — ABNORMAL HIGH (ref 70–99)
Potassium: 3.8 mmol/L (ref 3.5–5.2)
Sodium: 145 mmol/L — ABNORMAL HIGH (ref 134–144)
Total Protein: 6.8 g/dL (ref 6.0–8.5)
eGFR: 94 mL/min/{1.73_m2} (ref >59)

## 2023-07-31 LAB — NMR, LIPOPROFILE
Cholesterol, Total: 202 mg/dL — ABNORMAL HIGH (ref 100–199)
HDL Particle Number: 37.8 umol/L (ref >=30.5)
HDL-C: 58 mg/dL (ref >39)
LDL Particle Number: 1298 nmol/L — ABNORMAL HIGH (ref <1000)
LDL Size: 21.5 nmol (ref >20.5)
LDL-C (NIH Calc): 116 mg/dL — ABNORMAL HIGH (ref 0–99)
LP-IR Score: 55 — ABNORMAL HIGH (ref <=45)
Small LDL Particle Number: 461 nmol/L (ref <=527)
Triglycerides: 158 mg/dL — ABNORMAL HIGH (ref 0–149)

## 2023-07-31 LAB — T4, FREE: Free T4: 1.34 ng/dL (ref 0.82–1.77)

## 2023-07-31 LAB — TSH: TSH: 3.25 u[IU]/mL (ref 0.450–4.500)

## 2023-07-31 NOTE — Progress Notes (Signed)
Contacted via MyChart   Good afternoon M[place], your labs have returned: - Kidney function, creatinine and eGFR, remains normal, as is liver function, AST and ALT.  - Sodium level a little elevated, I recommend cutting back on salt and drinking more water. - Thyroid labs are normal, continue current Levothyroxine dosing. - Waiting on Lipid panel.  Any questions? Keep being stellar!!  Thank you for allowing me to participate in your care.  I appreciate you. Kindest regards, Geralyn Figiel

## 2023-07-31 NOTE — Progress Notes (Signed)
Contacted via MyChart   Good evening, lipid panel has returned and LDL + total cholesterol remain above goal for stroke prevention.  Are you taking Atorvastatin 40 MG daily?  Let me know as I would recommend increasing this to 80 MG daily for better control.

## 2023-07-31 NOTE — Progress Notes (Signed)
 Subjective:   Michelle Marks is a 73 y.o. female who presents for Medicare Annual (Subsequent) preventive examination.  This patient declined Interactive audio and Acupuncturist. Therefore the visit was completed with audio only.   Visit Complete: Virtual I connected with  Michelle Marks on 07/31/23 by a audio enabled telemedicine application and verified that I am speaking with the correct person using two identifiers.  Patient Location: Home  Provider Location: Home Office  I discussed the limitations of evaluation and management by telemedicine. The patient expressed understanding and agreed to proceed.  Vital Signs: Because this visit was a virtual/telehealth visit, some criteria may be missing or patient reported. Any vitals not documented were not able to be obtained and vitals that have been documented are patient reported.   Cardiac Risk Factors include: advanced age (>80men, >23 women);diabetes mellitus;dyslipidemia;obesity (BMI >30kg/m2)     Objective:    Today's Vitals   07/31/23 1512  Weight: 285 lb (129.3 kg)  Height: 5\' 4"  (1.626 m)   Body mass index is 48.92 kg/m.     07/31/2023    3:32 PM 07/22/2022    2:49 PM 05/29/2021   12:05 PM 12/02/2016    8:56 AM 11/26/2016    3:18 PM  Advanced Directives  Does Patient Have a Medical Advance Directive? No No Yes No No  Type of Advance Directive   Living will    Would patient like information on creating a medical advance directive? Yes (MAU/Ambulatory/Procedural Areas - Information given) No - Patient declined  No - Patient declined No - Patient declined    Current Medications (verified) Outpatient Encounter Medications as of 07/31/2023  Medication Sig   Accu-Chek FastClix Lancets MISC 1 each by Does not apply route daily before breakfast.   atorvastatin (LIPITOR) 40 MG tablet Take 1 tablet (40 mg total) by mouth daily.   augmented betamethasone dipropionate (DIPROLENE-AF) 0.05 % cream Apply 1  Application topically 2 (two) times daily as needed (to affected skin areas).   Blood Glucose Monitoring Suppl (ACCU-CHEK GUIDE) w/Device KIT To check blood sugar twice a day with goal fasting in morning <130 and goal 2 hours after meal <180.  Write all checks down for provider visits.   ergocalciferol (VITAMIN D2) 1.25 MG (50000 UT) capsule Take 1 capsule (50,000 Units total) by mouth once a week.   esomeprazole (NEXIUM) 40 MG capsule TAKE 1 CAPSULE BY MOUTH DAILY   glucose blood test strip To check blood sugar daily prior to eating in morning, with goal less the 130.   levothyroxine (SYNTHROID) 75 MCG tablet Take 1 tablet (75 mcg total) by mouth daily before breakfast.   torsemide (DEMADEX) 100 MG tablet Take 1 tablet (100 mg total) by mouth daily.   No facility-administered encounter medications on file as of 07/31/2023.    Allergies (verified) Sulfa antibiotics   History: Past Medical History:  Diagnosis Date   Anxiety    Arthritis    Difficult intubation    Edema, leg    Endometrial polyp    s/p Hysteroscopy D&C with polypectomy in 11/2016   GERD (gastroesophageal reflux disease)    Hyperlipidemia    Morbid obesity (HCC) 10/24/2016   Past Surgical History:  Procedure Laterality Date   BREAST CYST EXCISION Right age 33   benign   BREAST SURGERY Right    Cyst Removal   CERVICAL POLYPECTOMY N/A 12/02/2016   Procedure: CERVICAL POLYPECTOMY;  Surgeon: Hildred Laser, MD;  Location: [facility] ORS;  Service: Gynecology;  Laterality: N/A;   DILATION AND CURETTAGE OF UTERUS     HYSTEROSCOPY     HYSTEROSCOPY WITH D & C N/A 12/02/2016   Procedure: DILATATION AND CURETTAGE /HYSTEROSCOPY;  Surgeon: Hildred Laser, MD;  Location: [facility] ORS;  Service: Gynecology;  Laterality: N/A;   JOINT REPLACEMENT Left    Total Knee Replacement, Dr. Ernest Pine, [facility]   KNEE ARTHROSCOPY Left    TONSILLECTOMY     as a child   Family History  Problem Relation Age of Onset   Cancer Mother    Diabetes Mother     Hypertension Mother    Diabetes Father    Pancreatitis Father    Cancer Sister    Social History   Socioeconomic History   Marital status: Single    Spouse name: Not on file   Number of children: 1   Years of education: Not on file   Highest education level: Not on file  Occupational History   Not on file  Tobacco Use   Smoking status: Former    Current packs/day: 0.00    Average packs/day: 0.5 packs/day for 32.2 years (16.1 ttl pk-yrs)    Types: Cigarettes    Start date: 95    Quit date: 09/16/2003    Years since quitting: 19.8   Smokeless tobacco: Never  Vaping Use   Vaping status: Never Used  Substance and Sexual Activity   Alcohol use: Yes    Comment: 1-2 times per year   Drug use: No   Sexual activity: Not Currently    Birth control/protection: None  Other Topics Concern   Not on file  Social History Narrative   Not on file   Social Drivers of Health   Financial Resource Strain: Medium Risk (07/31/2023)   Overall Financial Resource Strain (CARDIA)    Difficulty of Paying Living Expenses: Somewhat hard  Food Insecurity: No Food Insecurity (07/31/2023)   Hunger Vital Sign    Worried About Running Out of Food in the Last Year: Never true    Ran Out of Food in the Last Year: Never true  Transportation Needs: No Transportation Needs (07/31/2023)   PRAPARE - Administrator, Civil Service (Medical): No    Lack of Transportation (Non-Medical): No  Physical Activity: Inactive (07/31/2023)   Exercise Vital Sign    Days of Exercise per Week: 0 days    Minutes of Exercise per Session: 0 min  Stress: Stress Concern Present (07/31/2023)   [employer] of Occupational Health - Occupational Stress Questionnaire    Feeling of Stress : Rather much  Social Connections: Socially Isolated (07/31/2023)   Social Connection and Isolation Panel [NHANES]    Frequency of Communication with Friends and Family: More than three times a week    Frequency of Social  Gatherings with Friends and Family: More than three times a week    Attends Religious Services: Never    Database administrator or Organizations: No    Attends Engineer, structural: Never    Marital Status: Never married    Tobacco Counseling Counseling given: Not Answered   Clinical Intake:  Pre-visit preparation completed: Yes  Pain : No/denies pain     BMI - recorded: 48.92 Nutritional Status: BMI > 30  Obese Nutritional Risks: None Diabetes: Yes CBG done?: No Did pt. bring in CBG monitor from home?: No  How often do you need to have someone help you when you read instructions, pamphlets, or other written materials from your  doctor or pharmacy?: 1 - Never  Interpreter Needed?: No  Information entered by :: Tora Kindred, CMA   Activities of Daily Living    07/31/2023    3:20 PM  In your present state of health, do you have any difficulty performing the following activities:  Hearing? 0  Vision? 0  Difficulty concentrating or making decisions? 0  Walking or climbing stairs? 1  Comment standard walker  Dressing or bathing? 0  Doing errands, shopping? 1  Comment doesn't drive, family member drives for her  Preparing Food and eating ? Y  Comment can't stand for very long to prepare food  Using the Toilet? N  In the past six months, have you accidently leaked urine? Y  Comment wears a pad  Do you have problems with loss of bowel control? N  Managing your Medications? N  Managing your Finances? N  Housekeeping or managing your Housekeeping? Y  Comment has someone to help with washing clothes and a seperate house cleaner    Patient Care Team: Marjie Skiff, NP as PCP - General (Nurse Practitioner) Isla Pence, OD (Optometry)  Indicate any recent Medical Services you may have received from other than [facility] providers in the past year (date may be approximate).     Assessment:   This is a routine wellness examination for  [place].  Hearing/Vision screen Hearing Screening - Comments:: Declines hearing loss Vision Screening - Comments:: Gets eye exam, Rexanne Mano [place]negard   Goals Addressed               This Visit's Progress     Patient Stated (pt-stated)        Go to pain management and get pain under control      Depression Screen    07/31/2023    3:28 PM 01/06/2023    1:59 PM 11/20/2022    1:53 PM 07/22/2022    2:46 PM 05/01/2022    1:46 PM 09/25/2021    1:12 PM 05/29/2021   12:23 PM  PHQ 2/9 Scores  PHQ - 2 Score 0 1 0 0 1 0 0  PHQ- 9 Score  7 5 0 1 2     Fall Risk    07/31/2023    3:34 PM 11/20/2022    1:53 PM 07/22/2022    2:49 PM 05/01/2022    1:45 PM 05/29/2021   12:12 PM  Fall Risk   Falls in the past year? 0 0 1 1 0  Number falls in past yr: 0 0 0 0 0  Injury with Fall? 0 0 0 1 0  Risk for fall due to : No Fall Risks No Fall Risks History of fall(s);Impaired mobility Impaired mobility;History of fall(s)   Follow up Falls prevention discussed;Falls evaluation completed  Falls prevention discussed;Falls evaluation completed Falls evaluation completed Falls evaluation completed;Education provided;Falls prevention discussed    MEDICARE RISK AT HOME: Medicare Risk at Home Any stairs in or around the home?: No (wheelchair ramp) If so, are there any without handrails?: No Home free of loose throw rugs in walkways, pet beds, electrical cords, etc?: Yes Adequate lighting in your home to reduce risk of falls?: Yes Life alert?: No Use of a cane, walker or w/c?: Yes (standard walker) Grab bars in the bathroom?: Yes Shower chair or bench in shower?: Yes Elevated toilet seat or a handicapped toilet?: No  TIMED UP AND GO:  Was the test performed?  No    Cognitive Function:  07/31/2023    3:36 PM 07/22/2022    2:56 PM  6CIT Screen  What Year? 0 points 0 points  What month? 0 points 0 points  What time? 0 points 0 points  Count back from 20 0 points 0 points  Months in reverse  0 points 0 points  Repeat phrase 2 points 2 points  Total Score 2 points 2 points    Immunizations Immunization History  Administered Date(s) Administered   PFIZER(Purple Top)SARS-COV-2 Vaccination 09/21/2019, 10/19/2019, 05/20/2020, 05/21/2021    TDAP status: Due, Education has been provided regarding the importance of this vaccine. Advised may receive this vaccine at local pharmacy or Health Dept. Aware to provide a copy of the vaccination record if obtained from local pharmacy or Health Dept. Verbalized acceptance and understanding.  Flu Vaccine status: Declined, Education has been provided regarding the importance of this vaccine but patient still declined. Advised may receive this vaccine at local pharmacy or Health Dept. Aware to provide a copy of the vaccination record if obtained from local pharmacy or Health Dept. Verbalized acceptance and understanding.  Pneumococcal vaccine status: Declined,  Education has been provided regarding the importance of this vaccine but patient still declined. Advised may receive this vaccine at local pharmacy or Health Dept. Aware to provide a copy of the vaccination record if obtained from local pharmacy or Health Dept. Verbalized acceptance and understanding.   Covid-19 vaccine status: Information provided on how to obtain vaccines.   Qualifies for Shingles Vaccine? Yes   Zostavax completed No   Shingrix Completed?: No.    Education has been provided regarding the importance of this vaccine. Patient has been advised to call insurance company to determine out of pocket expense if they have not yet received this vaccine. Advised may also receive vaccine at local pharmacy or Health Dept. Verbalized acceptance and understanding. Patient declined  Screening Tests Health Maintenance  Topic Date Due   OPHTHALMOLOGY EXAM  Never done   COVID-19 Vaccine (5 - 2024-25 season) 08/15/2023 (Originally 02/16/2023)   Zoster Vaccines- Shingrix (1 of 2) 08/15/2023  (Originally 03/11/2001)   INFLUENZA VACCINE  09/15/2023 (Originally 01/16/2023)   Pneumonia Vaccine 56+ Years old (1 of 2 - PCV) 11/20/2023 (Originally 03/11/1957)   MAMMOGRAM  11/20/2023 (Originally 07/10/2022)   DEXA SCAN  11/20/2023 (Originally 03/11/2016)   Diabetic kidney evaluation - Urine ACR  01/06/2024   FOOT EXAM  01/06/2024   HEMOGLOBIN A1C  01/27/2024   Diabetic kidney evaluation - eGFR measurement  07/29/2024   Medicare Annual Wellness (AWV)  07/30/2024   Fecal DNA (Cologuard)  10/09/2024   Hepatitis C Screening  Completed   HPV VACCINES  Aged Out   DTaP/Tdap/Td  Discontinued    Health Maintenance  Health Maintenance Due  Topic Date Due   OPHTHALMOLOGY EXAM  Never done    Colorectal cancer screening: Type of screening: Colonoscopy. Completed 10/09/21. Repeat every 3 years  Mammogram status: Ordered 07/31/23. Pt provided with contact info and advised to call to schedule appt.   Bone Density status: Ordered 07/31/23. Pt provided with contact info and advised to call to schedule appt.  Lung Cancer Screening: (Low Dose CT Chest recommended if Age 58-80 years, 20 pack-year currently smoking OR have quit w/in 15years.) does not qualify.   Lung Cancer Screening Referral: n/a  Additional Screening:  Hepatitis C Screening: does not qualify; Completed 09/25/21  Vision Screening: Recommended annual ophthalmology exams for early detection of glaucoma and other disorders of the eye. Is the patient up  to date with their annual eye exam?  Yes , requested results of most recent exam Who is the provider or what is the name of the office in which the patient attends annual eye exams? Dr. Julianne Rice [place] If pt is not established with a provider, would they like to be referred to a provider to establish care? No .   Dental Screening: Recommended annual dental exams for proper oral hygiene  Diabetic Foot Exam: Diabetic Foot Exam: Completed 01/06/23  Community Resource Referral / Chronic  Care Management: CRR required this visit?  No   CCM required this visit?  No     Plan:     I have personally reviewed and noted the following in the patient's chart:   Medical and social history Use of alcohol, tobacco or illicit drugs  Current medications and supplements including opioid prescriptions. Patient is not currently taking opioid prescriptions. Functional ability and status Nutritional status Physical activity Advanced directives List of other physicians Hospitalizations, surgeries, and ER visits in previous 12 months Vitals Screenings to include cognitive, depression, and falls Referrals and appointments  In addition, I have reviewed and discussed with patient certain preventive protocols, quality metrics, and best practice recommendations. A written personalized care plan for preventive services as well as general preventive health recommendations were provided to patient.     Tora Kindred, CMA   07/31/2023   After Visit Summary: (MyChart) Due to this being a telephonic visit, the after visit summary with patients personalized plan was offered to patient via MyChart   Nurse Notes:  6 CIT Score - 2 Requested most recent eye exam from Dr. Clydene Pugh Needs Tdap and covid vaccines. Declined DM & Nutrition edeucation Declined flu, pneumonia and shingles vaccines. Placed an order for a MMG and DEXA scan

## 2023-07-31 NOTE — Patient Instructions (Addendum)
[name] , Thank you for taking time to come for your Medicare Wellness Visit. I appreciate your ongoing commitment to your health goals. Please review the following plan we discussed and let me know if I can assist you in the future.   Referrals/Orders/Follow-Ups/Clinician Recommendations: Get the tetanus and covid vaccines at your convenience. I have placed an order for a mammogram and bone density test. Call [facility] @ [phone] to schedule at your convenience.  This is a list of the screening recommended for you and due dates:  Health Maintenance  Topic Date Due   Eye exam for diabetics  Never done   COVID-19 Vaccine (5 - 2024-25 season) 08/15/2023*   Zoster (Shingles) Vaccine (1 of 2) 08/15/2023*   Flu Shot  09/15/2023*   Pneumonia Vaccine (1 of 2 - PCV) 11/20/2023*   Mammogram  11/20/2023*   DEXA scan (bone density measurement)  11/20/2023*   Yearly kidney health urinalysis for diabetes  01/06/2024   Complete foot exam   01/06/2024   Hemoglobin A1C  01/27/2024   Yearly kidney function blood test for diabetes  07/29/2024   Medicare Annual Wellness Visit  07/30/2024   Cologuard (Stool DNA test)  10/09/2024   Hepatitis C Screening  Completed   HPV Vaccine  Aged Out   DTaP/Tdap/Td vaccine  Discontinued  *Topic was postponed. The date shown is not the original due date.    Advanced directives: (ACP Link)Information on Advanced Care Planning can be found at [facility] Advance Health Care Directives Advance Health Care Directives ([email or web address] You may also get these forms from your doctor's office.  Once you have completed the forms, please bring a copy of your health care power of attorney and living will to the office to be added to your chart at your convenience.   Next Medicare Annual Wellness Visit scheduled for next year: Yes, 08/12/24 @ 3:10pm (phone visit)

## 2023-08-08 ENCOUNTER — Telehealth: Payer: Self-pay

## 2023-08-08 NOTE — Telephone Encounter (Signed)
 Copied from CRM [id number]. Topic: Clinical - Medical Advice >> Aug 08, 2023 11:26 AM Gery Pray wrote: Reason for CRM: Patient calling NP Cannady because provider stated that if the pain clinic had not reached out to her by the EOW that she should call into the office and she will see what she could do. Callback number for the patient is [phone]

## 2023-08-08 NOTE — Telephone Encounter (Signed)
 Returned call to patient. She is aware referral has been sent to provider for review within pain management. She is aware I am still sending to [facility] as was requested by patient.

## 2023-08-19 ENCOUNTER — Telehealth: Payer: Self-pay

## 2023-08-19 NOTE — Telephone Encounter (Signed)
 Copied from CRM [id number]. Topic: General - Other >> Aug 18, 2023  4:08 PM Michelle Marks wrote:  pt. is requesting a prescription for a  transport chair or wheel chair. Please advise

## 2023-08-19 NOTE — Telephone Encounter (Signed)
 Patient will need an appointment to discuss and document the need for equipment per insurance. Called and LVM notifying patient of this and asked for her to please give [place] a call back to be scheduled to come in to discuss order.

## 2023-08-26 ENCOUNTER — Telehealth: Payer: Self-pay

## 2023-08-26 DIAGNOSIS — G8929 Other chronic pain: Secondary | ICD-10-CM

## 2023-08-26 NOTE — Telephone Encounter (Signed)
 Called and spoke to patient. She states that she is not able to come in for the appointment due to her mobility. States she was just here for an appointment. I explained to the patient that her insurance requires specific documentation in an office visit note to be able to cover her equipment. Patient states she had a hard time getting to the office for her last visit and was late for her appointment. States that she does not have transportation and her son has to rent a car, which costs $80, to be able to bring her to appointments.   Is there any type of resources or anyone that we can get involved to help the patient with transportation and getting her a chair?

## 2023-08-26 NOTE — Telephone Encounter (Signed)
 Copied from CRM [id number]. Topic: Appointments - Scheduling Inquiry for Clinic >> Aug 22, 2023  2:20 PM Geroge Baseman wrote: Reason for CRM: Patient would like a call back from Kamilia Carollo, she said she cannot figure out how to send MyChart messages. She has a few more questions she would like to be able to ask her before she schedules her appointment.

## 2023-09-01 ENCOUNTER — Telehealth: Payer: Self-pay

## 2023-09-01 NOTE — Progress Notes (Signed)
 Complex Care Management Note  Care Guide Note 09/01/2023 Name: Michelle Marks MRN: [id number] DOB: 06-25-1950  Michelle Marks is a 74 y.o. year old female who sees Aura Dials T, NP for primary care. I reached out to [name] by phone today to offer complex care management services.  [name] was given information about Complex Care Management services today including:   The Complex Care Management services include support from the care team which includes your Nurse Care Manager, Clinical Social Worker, or Pharmacist.  The Complex Care Management team is here to help remove barriers to the health concerns and goals most important to you. Complex Care Management services are voluntary, and the patient may decline or stop services at any time by request to their care team member.   Complex Care Management Consent Status: Patient agreed to services and verbal consent obtained.   Follow up plan:  Telephone appointment with complex care management team member scheduled for:  BSW 09/17/2023 and RNCM 10/01/2023  Encounter Outcome:  Patient Scheduled  Penne Lash , RMA       [facility], [facility] Guide  Direct Dial: [phone]  Website: [email or web address]

## 2023-09-17 ENCOUNTER — Other Ambulatory Visit: Payer: Self-pay

## 2023-09-17 NOTE — Patient Instructions (Signed)
 Visit Information  The Patient                                              was given information about Medicaid Managed Care team care coordination services and consented to engagement with the [facility] Managed Care team.   The  Patient                                              has been provided with contact information for the Managed Medicaid care management team and has been advised to call with any health related questions or concerns.   Abelino Derrick, MHA [facility]  Managed [facility] Social Worker [phone]

## 2023-09-17 NOTE — Patient Outreach (Signed)
 Medicaid Managed Care Social Work Note  09/17/2023 Name:  Michelle Marks MRN:  [id number] DOB:  1950/08/20  Michelle Marks is an 73 y.o. year old female who is a primary patient of Michelle Marks, Michelle Rank, NP.  The [facility] Managed Care Coordination team was consulted for assistance with:  Transportation Needs   [name] was given information about Medicaid Managed Care Coordination team services today. Michelle Marks Patient agreed to services and verbal consent obtained.  Engaged with patient  for by telephone forinitial visit in response to referral for case management and/or care coordination services.   Patient is participating in a Managed Medicaid Plan:  Yes  Assessments/Interventions:  Review of past medical history, allergies, medications, health status, including review of consultants reports, laboratory and other test data, was performed as part of comprehensive evaluation and provision of chronic care management services.  SDOH: (Social Drivers of Health) assessments and interventions performed: SDOH Interventions    Flowsheet Row Clinical Support from 07/31/2023 in [facility] Clinical Support from 07/22/2022 in [facility] Office Visit from 05/01/2022 in [facility] Clinical Support from 05/29/2021 in [facility]  SDOH Interventions      Food Insecurity Interventions Intervention Not Indicated Intervention Not Indicated -- Intervention Not Indicated  Housing Interventions Intervention Not Indicated Intervention Not Indicated -- Intervention Not Indicated  Transportation Interventions Intervention Not Indicated, Patient Resources (Friends/Family) Intervention Not Indicated, Patient Resources (Friends/Family) -- Intervention Not Indicated  Utilities Interventions Intervention Not Indicated Intervention Not Indicated -- --  Alcohol Usage Interventions Intervention Not Indicated (Score  <7) Intervention Not Indicated (Score <7) -- --  Depression Interventions/Treatment  -- -- PHQ2-9 Score <4 Follow-up Not Indicated --  Financial Strain Interventions Intervention Not Indicated Intervention Not Indicated -- Intervention Not Indicated  Physical Activity Interventions Patient Declined Intervention Not Indicated -- Intervention Not Indicated  Stress Interventions Intervention Not Indicated Intervention Not Indicated -- Intervention Not Indicated  Social Connections Interventions Intervention Not Indicated Intervention Not Indicated -- Intervention Not Indicated  Health Literacy Interventions Intervention Not Indicated -- -- --     BSW completed a telephone outreach with patient, she states her son takes her to appointments but she is unable to get in the truck. They have rented a car and that was easier for her. Patient states she is familiar with all of the transportation services in [facility], but has to have her son with her. Patient states she does not want to take one of the transportation services because she does not want to wait for other passengers to get dropped off. Patient states she is in the process of getting a transfer chair and that will help her a lot.  She has the application to apply for foodstamps and receives meals on wheels. Patient states no other resources are needed at this time. BSW provided contact information for any future needs.  Advanced Directives Status:  Not addressed in this encounter.  Care Plan                 Allergies  Allergen Reactions   Sulfa Antibiotics Nausea Only    Medications Reviewed Today   Medications were not reviewed in this encounter     Patient Active Problem List   Diagnosis Date Noted   Chronic back pain 01/06/2023   Proteinuria 09/25/2021   Type 2 diabetes mellitus with obesity (HCC) 02/12/2021   Lymphedema in adult patient 02/12/2021  White coat syndrome without diagnosis of hypertension 02/12/2021    Hyperlipidemia associated with type 2 diabetes mellitus (HCC) 02/09/2021   Vitamin D deficiency 02/09/2021   GERD without esophagitis 02/09/2021   Hypothyroidism 02/09/2021   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/2020   Psoriasis 08/21/2020   Rheumatoid factor positive 08/21/2020   Morbid obesity (HCC) 10/24/2016    Conditions to be addressed/monitored per PCP order:   Transportation  There are no care plans that you recently modified to display for this patient.   Follow up:  Patient requests no follow-up at this time.  Plan: The  Patient has been provided with contact information for the Managed Medicaid care management team and has been advised to call with any health related questions or concerns.    Abelino Derrick, MHA [facility]  Managed [facility] Social Worker [phone]

## 2023-09-21 DIAGNOSIS — M899 Disorder of bone, unspecified: Secondary | ICD-10-CM | POA: Insufficient documentation

## 2023-09-21 DIAGNOSIS — Z79899 Other long term (current) drug therapy: Secondary | ICD-10-CM | POA: Insufficient documentation

## 2023-09-21 DIAGNOSIS — Z789 Other specified health status: Secondary | ICD-10-CM | POA: Insufficient documentation

## 2023-09-21 DIAGNOSIS — G894 Chronic pain syndrome: Secondary | ICD-10-CM | POA: Insufficient documentation

## 2023-09-21 NOTE — Progress Notes (Unsigned)
 PROVIDER NOTE: Interpretation of information contained herein should be left to medically-trained personnel. Specific patient instructions are provided elsewhere under "Patient Instructions" section of medical record. This document was created in part using AI and STT-dictation technology, any transcriptional errors that may result from this process are unintentional.  Patient: Michelle Marks  Service: E/M Encounter  PCP: Marjie Skiff, NP  DOB: 1951/02/17  DOS: 09/22/2023  Provider: Oswaldo Done, MD  MRN: [id number]  Delivery: Face-to-face  Specialty: Interventional Pain Management  Type: New Patient  Setting: Ambulatory outpatient facility  Specialty designation: 09  Referring Prov.: Marjie Skiff, NP  Location: Outpatient office facility     Primary Reason(s) for Visit: Encounter for initial evaluation of one or more chronic problems (new to examiner) potentially causing chronic pain, and posing a threat to normal musculoskeletal function. (Level of risk: High) CC: No chief complaint on file.  HPI  Michelle Marks is a 73 y.o. year old, female patient, who comes for the first time to our practice referred by Aura Dials T, NP for our initial evaluation of her chronic pain. She has Morbid obesity (HCC); Idiopathic chronic gout of right foot without tophus; Psoriasis; Rheumatoid factor positive; Hyperlipidemia associated with type 2 diabetes mellitus (HCC); Vitamin D deficiency; GERD without esophagitis; Hypothyroidism; Type 2 diabetes mellitus with obesity (HCC); Lymphedema in adult patient; White coat syndrome without diagnosis of hypertension; Proteinuria; Chronic back pain; Chronic pain syndrome; Pharmacologic therapy; Disorder of skeletal system; and Problems influencing health status on their problem list. Today she comes in for evaluation of her No chief complaint on file.  Pain Assessment: Location:     Radiating:   Onset:   Duration:   Quality:   Severity:  /10 (subjective,  self-reported pain score)  Effect on ADL:   Timing:   Modifying factors:   BP:    HR:    Onset and Duration: {Hx; Onset and Duration:210120511} Cause of pain: {Hx; Cause:210120521} Severity: {Pain Severity:210120502} Timing: {Symptoms; Timing:210120501} Aggravating Factors: {Causes; Aggravating pain factors:210120507} Alleviating Factors: {Causes; Alleviating Factors:210120500} Associated Problems: {Hx; Associated problems:210120515} Quality of Pain: {Hx; Symptom quality or Descriptor:210120531} Previous Examinations or Tests: {Hx; Previous examinations or test:210120529} Previous Treatments: {Hx; Previous Treatment:210120503}  Michelle Marks is being evaluated for possible interventional pain management therapies for the treatment of her chronic pain.  Discussed the use of AI scribe software for clinical note transcription with the patient, who gave verbal consent to proceed.  History of Present Illness           ***  Michelle Marks has been informed that this initial visit was an evaluation only.  On the follow up appointment I will go over the results, including ordered tests and available interventional therapies. At that time she will have the opportunity to decide whether to proceed with offered therapies or not. In the event that [name] prefers avoiding interventional options, this will conclude our involvement in the case.  Medication management recommendations may be provided upon request.  Patient informed that diagnostic tests may be ordered to assist in identifying underlying causes, narrow the list of differential diagnoses and aid in determining candidacy for (or contraindications to) planned therapeutic interventions.  Historic Controlled Substance Pharmacotherapy Review  PMP and historical list of controlled substances: ***  Most recently prescribed opioid analgesics: *** MME/day: *** mg/day  Historical Monitoring: The patient  reports no history of drug use. List  of prior UDS Testing: No results found for: "MDMA", "COCAINSCRNUR", "PCPSCRNUR", "PCPQUANT", "CANNABQUANT", "THCU", "  ETH", "CBDTHCR", "D8THCCBX", "D9THCCBX" Historical Background Evaluation: [place] PMP: PDMP reviewed during this encounter. Review of the past 99-months conducted.             PMP NARX Score Report:  Narcotic: 140 Sedative: 080 Stimulant: 000 [place] Department of public safety, offender search: Engineer, mining Information) Non-contributory Risk Assessment Profile: Aberrant behavior: None observed or detected today Risk factors for fatal opioid overdose: None identified today PMP NARX Overdose Risk Score: 000 Fatal overdose hazard ratio (HR): Calculation deferred Non-fatal overdose hazard ratio (HR): Calculation deferred Risk of opioid abuse or dependence: 0.7-3.0% with doses <= 36 MME/day and 6.1-26% with doses >= 120 MME/day. Substance use disorder (SUD) risk level: See below Personal History of Substance Abuse (SUD-Substance use disorder):  Alcohol:    Illegal Drugs:    Rx Drugs:    ORT Risk Level calculation:    ORT Scoring interpretation table:  Score <3 = Low Risk for SUD  Score between 4-7 = Moderate Risk for SUD  Score >8 = High Risk for Opioid Abuse   PHQ-2 Depression Scale:  Total score:    PHQ-2 Scoring interpretation table: (Score and probability of major depressive disorder)  Score 0 = No depression  Score 1 = 15.4% Probability  Score 2 = 21.1% Probability  Score 3 = 38.4% Probability  Score 4 = 45.5% Probability  Score 5 = 56.4% Probability  Score 6 = 78.6% Probability   PHQ-9 Depression Scale:  Total score:    PHQ-9 Scoring interpretation table:  Score 0-4 = No depression  Score 5-9 = Mild depression  Score 10-14 = Moderate depression  Score 15-19 = Moderately severe depression  Score 20-27 = Severe depression (2.4 times higher risk of SUD and 2.89 times higher risk of overuse)   Pharmacologic Plan: As per protocol, I have not taken over any controlled  substance management, pending the results of ordered tests and/or consults.            Initial impression: Pending review of available data and ordered tests.  Meds   Current Outpatient Medications:    Accu-Chek FastClix Lancets MISC, 1 each by Does not apply route daily before breakfast., Disp: 100 each, Rfl: 3   atorvastatin (LIPITOR) 40 MG tablet, Take 1 tablet (40 mg total) by mouth daily., Disp: 90 tablet, Rfl: 3   augmented betamethasone dipropionate (DIPROLENE-AF) 0.05 % cream, Apply 1 Application topically 2 (two) times daily as needed (to affected skin areas)., Disp: 50 g, Rfl: 4   Blood Glucose Monitoring Suppl (ACCU-CHEK GUIDE) w/Device KIT, To check blood sugar twice a day with goal fasting in morning <130 and goal 2 hours after meal <180.  Write all checks down for provider visits., Disp: 1 kit, Rfl: 0   ergocalciferol (VITAMIN D2) 1.25 MG (50000 UT) capsule, Take 1 capsule (50,000 Units total) by mouth once a week., Disp: 12 capsule, Rfl: 4   esomeprazole (NEXIUM) 40 MG capsule, TAKE 1 CAPSULE BY MOUTH DAILY, Disp: 90 capsule, Rfl: 2   glucose blood test strip, To check blood sugar daily prior to eating in morning, with goal less the 130., Disp: 100 each, Rfl: 12   levothyroxine (SYNTHROID) 75 MCG tablet, Take 1 tablet (75 mcg total) by mouth daily before breakfast., Disp: 90 tablet, Rfl: 2   torsemide (DEMADEX) 100 MG tablet, Take 1 tablet (100 mg total) by mouth daily., Disp: 90 tablet, Rfl: 2  Imaging Review   Complexity Note: Imaging results reviewed.  ROS  Cardiovascular: {Hx; Cardiovascular History:210120525} Pulmonary or Respiratory: {Hx; Pumonary and/or Respiratory History:210120523} Neurological: {Hx; Neurological:210120504} Psychological-Psychiatric: {Hx; Psychological-Psychiatric History:210120512} Gastrointestinal: {Hx; Gastrointestinal:210120527} Genitourinary: {Hx; Genitourinary:210120506} Hematological: {Hx;  Hematological:210120510} Endocrine: {Hx; Endocrine history:210120509} Rheumatologic: {Hx; Rheumatological:210120530} Musculoskeletal: {Hx; Musculoskeletal:210120528} Work History: {Hx; Work history:210120514}  Allergies  Ms. Caudell is allergic to sulfa antibiotics.  Laboratory Chemistry Profile   Renal Lab Results  Component Value Date   BUN 17 07/30/2023   CREATININE 0.63 07/30/2023   BCR 27 07/30/2023     Electrolytes Lab Results  Component Value Date   NA 145 (H) 07/30/2023   K 3.8 07/30/2023   CL 98 07/30/2023   CALCIUM 9.7 07/30/2023   MG 1.9 05/01/2022     Hepatic Lab Results  Component Value Date   AST 13 07/30/2023   ALT 13 07/30/2023   ALBUMIN 4.2 07/30/2023   ALKPHOS 166 (H) 07/30/2023     ID No results found for: "LYMEIGGIGMAB", "HIV", "SARSCOV2NAA", "STAPHAUREUS", "MRSAPCR", "HCVAB", "PREGTESTUR", "RMSFIGG", "QFVRPH1IGG", "QFVRPH2IGG"   Bone Lab Results  Component Value Date   VD25OH 30.5 11/20/2022     Endocrine Lab Results  Component Value Date   GLUCOSE 126 (H) 07/30/2023   HGBA1C 6.4 (H) 07/30/2023   TSH 3.250 07/30/2023   FREET4 1.34 07/30/2023     Neuropathy Lab Results  Component Value Date   HGBA1C 6.4 (H) 07/30/2023     CNS No results found for: "COLORCSF", "APPEARCSF", "RBCCOUNTCSF", "WBCCSF", "POLYSCSF", "LYMPHSCSF", "EOSCSF", "PROTEINCSF", "GLUCCSF", "JCVIRUS", "CSFOLI", "IGGCSF", "LABACHR", "ACETBL"   Inflammation (CRP: Acute  ESR: Chronic) Lab Results  Component Value Date   CRP 3 09/25/2021   ESRSEDRATE 40 09/25/2021     Rheumatology Lab Results  Component Value Date   ANA Negative 09/25/2021   LABURIC 7.9 11/20/2022     Coagulation Lab Results  Component Value Date   PLT 239 11/20/2022     Cardiovascular Lab Results  Component Value Date   HGB 14.1 11/20/2022   HCT 44.2 11/20/2022     Screening No results found for: "SARSCOV2NAA", "COVIDSOURCE", "STAPHAUREUS", "MRSAPCR", "HCVAB", "HIV", "PREGTESTUR"    Cancer No results found for: "CEA", "CA125", "LABCA2"   Allergens No results found for: "ALMOND", "APPLE", "ASPARAGUS", "AVOCADO", "BANANA", "BARLEY", "BASIL", "BAYLEAF", "GREENBEAN", "LIMABEAN", "WHITEBEAN", "BEEFIGE", "REDBEET", "BLUEBERRY", "BROCCOLI", "CABBAGE", "MELON", "CARROT", "CASEIN", "CASHEWNUT", "CAULIFLOWER", "CELERY"     Note: Lab results reviewed.  PFSH  Drug: Ms. Schwier  reports no history of drug use. Alcohol:  reports current alcohol use. Tobacco:  reports that she quit smoking about 20 years ago. Her smoking use included cigarettes. She started smoking about 52 years ago. She has a 16.1 pack-year smoking history. She has never used smokeless tobacco. Medical:  has a past medical history of Anxiety, Arthritis, Difficult intubation, Edema, leg, Endometrial polyp, GERD (gastroesophageal reflux disease), Hyperlipidemia, and Morbid obesity (HCC) (10/24/2016). Family: family history includes Cancer in her mother and sister; Diabetes in her father and mother; Hypertension in her mother; Pancreatitis in her father.  Past Surgical History:  Procedure Laterality Date   BREAST CYST EXCISION Right age 30   benign   BREAST SURGERY Right    Cyst Removal   CERVICAL POLYPECTOMY N/A 12/02/2016   Procedure: CERVICAL POLYPECTOMY;  Surgeon: Hildred Laser, MD;  Location: [facility] ORS;  Service: Gynecology;  Laterality: N/A;   DILATION AND CURETTAGE OF UTERUS     HYSTEROSCOPY     HYSTEROSCOPY WITH D & C N/A 12/02/2016   Procedure: DILATATION AND CURETTAGE /HYSTEROSCOPY;  Surgeon: Hildred Laser, MD;  Location: [facility] OR[facility];  Service: Gynecology;  Laterality: N/A;   JOINT REPLACEMENT Left    Total Knee Replacement, Dr. Ernest Pine, [place]   KNEE ARTHROSCOPY Left    TONSILLECTOMY     as a child   Active Ambulatory Problems    Diagnosis Date Noted   Morbid obesity (HCC) 10/24/2016   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/2020   Psoriasis 08/21/2020   Rheumatoid factor positive  08/21/2020   Hyperlipidemia associated with type 2 diabetes mellitus (HCC) 02/09/2021   Vitamin D deficiency 02/09/2021   GERD without esophagitis 02/09/2021   Hypothyroidism 02/09/2021   Type 2 diabetes mellitus with obesity (HCC) 02/12/2021   Lymphedema in adult patient 02/12/2021   White coat syndrome without diagnosis of hypertension 02/12/2021   Proteinuria 09/25/2021   Chronic back pain 01/06/2023   Chronic pain syndrome 09/21/2023   Pharmacologic therapy 09/21/2023   Disorder of skeletal system 09/21/2023   Problems influencing health status 09/21/2023   Resolved Ambulatory Problems    Diagnosis Date Noted   Post-menopausal bleeding 10/24/2016   Overweight 08/21/2020   Type 2 diabetes mellitus with obesity (HCC) 02/09/2021   Past Medical History:  Diagnosis Date   Anxiety    Arthritis    Difficult intubation    Edema, leg    Endometrial polyp    GERD (gastroesophageal reflux disease)    Hyperlipidemia    Constitutional Exam  General appearance: Well nourished, well developed, and well hydrated. In no apparent acute distress There were no vitals filed for this visit. BMI Assessment: Estimated body mass index is 48.92 kg/m as calculated from the following:   Height as of 07/31/23: 5\' 4"  (1.626 m).   Weight as of 07/31/23: 285 lb (129.3 kg).  BMI interpretation table: BMI level Category Range association with higher incidence of chronic pain  <18 kg/m2 Underweight   18.5-24.9 kg/m2 Ideal body weight   25-29.9 kg/m2 Overweight Increased incidence by 20%  30-34.9 kg/m2 Obese (Class I) Increased incidence by 68%  35-39.9 kg/m2 Severe obesity (Class II) Increased incidence by 136%  >40 kg/m2 Extreme obesity (Class III) Increased incidence by 254%   Patient's current BMI Ideal Body weight  There is no height or weight on file to calculate BMI. Patient weight not recorded   BMI Readings from Last 4 Encounters:  07/31/23 48.92 kg/m  07/30/23 51.30 kg/m  01/06/23  51.21 kg/m  11/20/22 51.14 kg/m   Wt Readings from Last 4 Encounters:  07/31/23 285 lb (129.3 kg)  07/30/23 289 lb 9.6 oz (131.4 kg)  01/06/23 289 lb (131.1 kg)  11/20/22 288 lb 9.6 oz (130.9 kg)    Psych/Mental status: Alert, oriented x 3 (person, place, & time)       Eyes: PERLA Respiratory: No evidence of acute respiratory distress  Assessment  Primary Diagnosis & Pertinent Problem List: The primary encounter diagnosis was Chronic pain syndrome. Diagnoses of Pharmacologic therapy, Disorder of skeletal system, and Problems influencing health status were also pertinent to this visit.  Visit Diagnosis (New problems to examiner): 1. Chronic pain syndrome   2. Pharmacologic therapy   3. Disorder of skeletal system   4. Problems influencing health status    Plan of Care (Initial workup plan)  Note: Ms. Skyles was reminded that as per protocol, today's visit has been an evaluation only. We have not taken over the patient's controlled substance management.  Problem-specific plan: Assessment and Plan           Lab Orders  No laboratory test(s) ordered today   Imaging Orders  No imaging studies ordered today   Referral Orders  No referral(s) requested today   Procedure Orders    No procedure(s) ordered today   Pharmacotherapy (current): Medications ordered:  No orders of the defined types were placed in this encounter.  Medications administered during this visit: Verlon Carcione. Olivero had no medications administered during this visit.   Analgesic Pharmacotherapy:  Opioid Analgesics: For patients currently taking or requesting to take opioid analgesics, in accordance with [facility] Guidelines, we will assess their risks and indications for the use of these substances. After completing our evaluation, we may offer recommendations, but we no longer take patients for medication management. The prescribing physician will ultimately decide, based on his/her  training and level of comfort whether to adopt any of the recommendations, including whether or not to prescribe such medicines.  Membrane stabilizer: To be determined at a later time  Muscle relaxant: To be determined at a later time  NSAID: To be determined at a later time  Other analgesic(s): To be determined at a later time   Interventional management options: Ms. Chasen was informed that there is no guarantee that she would be a candidate for interventional therapies. The decision will be based on the results of diagnostic studies, as well as Ms. Welp's risk profile.  Procedure(s) under consideration:  Pending results of ordered studies     Interventional Therapies  Risk Factors  Considerations  Medical Comorbidities:     Planned  Pending:      Under consideration:   Pending   Completed:   None at this time   Therapeutic  Palliative (PRN) options:   None established   Completed by other providers:   None reported     Provider-requested follow-up: No follow-ups on file.  Future Appointments  Date Time Provider Department Center  09/22/2023  1:00 PM Delano Metz, MD [facility] None  10/16/2023  3:30 PM Juanell Fairly, RN [facility] None  02/06/2024  1:20 PM Aura Dials T, NP [facility]  08/12/2024  3:10 PM [facility]-ANNUAL WELLNESS VISIT [facility]   I discussed the assessment and treatment plan with the patient. The patient was provided an opportunity to ask questions and all were answered. The patient agreed with the plan and demonstrated an understanding of the instructions.  Patient advised to call back or seek an in-person evaluation if the symptoms or condition worsens.  Duration of encounter: *** minutes.  Total time on encounter, as per AMA guidelines included both the face-to-face and non-face-to-face time personally spent by the physician and/or other qualified health care professional(s) on the day of the encounter (includes time in activities that  require the physician or other qualified health care professional and does not include time in activities normally performed by clinical staff). Physician's time may include the following activities when performed: Preparing to see the patient (e.g., pre-charting review of records, searching for previously ordered imaging, lab work, and nerve conduction tests) Review of prior analgesic pharmacotherapies. Reviewing PMP Interpreting ordered tests (e.g., lab work, imaging, nerve conduction tests) Performing post-procedure evaluations, including interpretation of diagnostic procedures Obtaining and/or reviewing separately obtained history Performing a medically appropriate examination and/or evaluation Counseling and educating the patient/family/caregiver Ordering medications, tests, or procedures Referring and communicating with other health care professionals (when not separately reported) Documenting clinical information in the electronic or other health record Independently interpreting results (not separately reported) and communicating results to the patient/ family/caregiver Care  coordination (not separately reported)  Note by: Oswaldo Done, MD (TTS and AI technology used. I apologize for any typographical errors that were not detected and corrected.) Date: 09/22/2023; Time: 2:55 PM

## 2023-09-21 NOTE — Patient Instructions (Incomplete)
 ______________________________________________________________________    New Patients  Welcome to [facility].   Initial Visit The first or initial visit consists of an evaluation only.   Interventional pain management.  We offer therapies other than opioid controlled substances to manage chronic pain. These include, but are not limited to, diagnostic, therapeutic, and palliative specialized injection therapies (i.e.: Epidural Steroids, Facet Blocks, etc.). We specialize in a variety of nerve blocks as well as radiofrequency treatments. We offer pain implant evaluations and trials, as well as follow up management. In addition we also provide a variety joint injections, including Viscosupplementation (AKA: Gel Therapy).  Prescription Pain Medication. We specialize in alternatives to opioids. We can provide evaluations and recommendations for/of pharmacologic therapies based on CDC Guidelines.  We no longer take patients for long-term medication management. We will not be taking over your pain medications.  ______________________________________________________________________      ______________________________________________________________________    Patient Information update  To: All of our patients.  Re: Name change.  It has been made official that our current name, "[facility]"   will soon be changed to "[facility]".   The purpose of this change is to eliminate any confusion created by the concept of our practice being a "Medication Management Pain Clinic". In the past this has led to the misconception that we treat pain primarily by the use of prescription medications.  Nothing can be farther from the truth.   Understanding PAIN MANAGEMENT: To further understand what our practice does, you first have to  understand that "Pain Management" is a subspecialty that requires additional training once a physician has completed their specialty training, which can be in either Anesthesia, Neurology, Psychiatry, or Physical Medicine and Rehabilitation (PMR). Each one of these contributes to the final approach taken by each physician to the management of their patient's pain. To be a "Pain Management Specialist" you must have first completed one of the specialty trainings below.  Anesthesiologists - trained in clinical pharmacology and interventional techniques such as nerve blockade and regional as well as central neuroanatomy. They are trained to block pain before, during, and after surgical interventions.  Neurologists - trained in the diagnosis and pharmacological treatment of complex neurological conditions, such as Multiple Sclerosis, Parkinson's, spinal cord injuries, and other systemic conditions that may be associated with symptoms that may include but are not limited to pain. They tend to rely primarily on the treatment of chronic pain using prescription medications.  Psychiatrist - trained in conditions affecting the psychosocial wellbeing of patients including but not limited to depression, anxiety, schizophrenia, personality disorders, addiction, and other substance use disorders that may be associated with chronic pain. They tend to rely primarily on the treatment of chronic pain using prescription medications.   Physical Medicine and Rehabilitation (PMR) physicians, also known as physiatrists - trained to treat a wide variety of medical conditions affecting the brain, spinal cord, nerves, bones, joints, ligaments, muscles, and tendons. Their training is primarily aimed at treating patients that have suffered injuries that have caused severe physical impairment. Their training is primarily aimed at the physical therapy and rehabilitation of those patients. They may also work alongside orthopedic surgeons  or neurosurgeons using their expertise in assisting surgical patients to recover after their surgeries.  INTERVENTIONAL PAIN MANAGEMENT is sub-subspecialty of Pain Management.  Our physicians are Board-certified in Anesthesia, Pain Management, and Interventional Pain Management.  This meaning that  not only have they been trained and Board-certified in their specialty of Anesthesia, and subspecialty of Pain Management, but they have also received further training in the sub-subspecialty of Interventional Pain Management, in order to become Board-certified as INTERVENTIONAL PAIN MANAGEMENT SPECIALIST.    Mission: Our goal is to use our skills in  INTERVENTIONAL PAIN MANAGEMENT as alternatives to the chronic use of prescription opioid medications for the treatment of pain. To make this more clear, we have changed our name to reflect what we do and offer. We will continue to offer medication management assessment and recommendations, but we will not be taking over any patient's medication management.  ______________________________________________________________________     ____________________________________________________________________________________________  Gout    Mechanism: Uric acid accumulation.    Uric Acid: Uric acid is a heterocyclic compound of carbon, nitrogen, oxygen, and hydrogen with the formula [id number]. It forms ions and salts known as urates and acid urates such as ammonium acid urate. Uric acid is a product of the metabolic breakdown of purine nucleotides. High blood concentrations of uric acid can lead to gout. The chemical is associated with other medical conditions including diabetes and the formation of ammonium acid urate kidney stones.    Purines: Purines are found in high concentration in meat and meat products, especially internal organs such as liver and kidney. In general, plant-based diets are low in purines. Examples of high-purine sources include: sweetbreads,  anchovies, sardines, liver, beef kidneys, brains, meat extracts (e.g., Oxo, Bovril), herring, mackerel, scallops, game meats, beer (from the yeast) and gravy. A moderate amount of purine is also contained in beef, pork, poultry, other fish and seafood, asparagus, cauliflower, spinach, mushrooms, green peas, lentils, dried peas, beans, oatmeal, wheat bran, wheat germ, and hawthorn. Higher levels of meat and seafood consumption are associated with an increased risk of gout, whereas a higher level of consumption of dairy products is associated with a decreased risk. Moderate intake of purine-rich vegetables or protein is not associated with an increased risk of gout.    Causes: Uric acid is generated as the body's tissues are broken down during normal cell turnover. Some people with gout generate too much uric acid (10%). Other patients with gout do not effectively eliminate their uric acid into the urine (90%). Genetics, gender,and nutrition (alcoholism, obesity) play key roles in the development of gout.   If your parents have gout, then you have a 20% chance of developing it.   [place] people are 5 times more likely to develop gout. American blacks, but not African blacks, are more likely to have gout than other populations. Use of alcohol, especially beer, increases the risk for gout.   Diets rich in red meats, internal organs, yeast, and oily fish increase the risk for gout.   Uric acid levels increase at puberty in men and at menopause in women, so men first develop gout at an earlier age (30s to 1s) than do women (9s to 12s). Gout in pre-menopausal women is distinctly unusual.   Attacks of gouty arthritis?can be precipitated when there is a sudden change in uric acid levels.   Overindulgence of alcohol and red meats   Trauma   Starvation and dehydration IV contrast dyes   Chemotherapy     Some Possible Causes of Elevated Uric Acid Levels  Medication Diuretics used for weight loss or heart  disease, insulin, some antibiotics, medication for rheumatoid arthritis, or an overdose of B vitamins can cause uric acid levels to rise. Diuretics reduce sodium, magnesium, calcium  and potassium (among other things) levels. If you need to use a diuretic, see our natural herbal products for ones with fewer side effects. One customer reported getting gout when he took beta-blockers for his high blood pressure.     Poor kidney function When kidneys are not functioning at optimum levels, they lose their ability to excrete uric acid from the body. This situation may be due to various kidney problems or over-consumption of alcohol. When alcohol is metabolized, lactic acid is produced, which hinders uric acid excretion by the kidneys.     Dieting Severe dieting or fasting can cause excess lactic acid, which hinders uric acid excretion by the kidneys. Crash and severe calorie restriction diets shock your metabolism and can trigger a gout attack. Dieting may also cause a loss of potassium, which can increase urate levels in the blood. As mentioned above, some dieters also use diuretics to speed the process, and they can rob the body of potassium and other minerals, triggering a gout attack. It seems to be a vicious circle! However, a proper diet that is done slowly is recommended because losing weight will reduce serum levels of uric acid.    Diet Traditional thinking tells [place] that gout is the result of excessive amounts of alcohol, protein, heavy foods, coffee and soft drinks in your diet. Certain foods contain high levels of purine which can cause uric acid levels to rise. Purine is a protein substance that is transformed into uric acid during digestion. Reduction in consumption of these foods is very often successful in reducing or eliminating gout.     A potassium deficiency can increase urate levels in the blood. This is very important, and ways to correct it?are discussed above and under the diuretics section.     Drugs that increase serum uric acid  Aspirin (Low dose)  Diuretics  hypertensive medications   Nicotinic acid   Cyclosporine A   Acetaminophen (Tylenol)  Others     Pharmacological treatment:  1) Colchicine (PO)  Adverse Side Effects: nausea, vomiting, diarrhea  MAX: 6 mg/day during an acute attack  Goal: keep serum urate < 7.0 mg/dl Prophylactic Dose: 0.6 -1.2 mg/day  2) Probenecid (Uricosuric properties)  Mechanism of Action: increases uric acid excretion  Adverse Side Effects: overt nephrolithiasis (Kidney stones). Side effects of probenecid are uncommon and usually mild. In addition to causing kidney stones and precipitating acute gouty arthritis, side effects of probenecid include hair loss, skin rash, headache, nausea, sore gums, and fever. In rare instances, it has caused severe anemias  To Avoid Stones:  start at low doses  Stay well hydrated  Alkalinize urine  1) Sodium Bicarbonate  2) And/or Acetazolamide (Carbonic anhydrate inhibitor) Starting Dose: 250 mg bid and increase over several weeks  3) Allopurinol  Mechanism of Action: Decreases uric acid production  Adverse Side Effects: fever, dermatitis, elevated liver enzymes, diarrhea, and vasculitis. The most frequent adverse reaction to allopurinol is skin rash. Allopurinol should be discontinued immediately at the first appearance of rash, painful urination, blood in the urine, eye irritation, or swelling of the mouth or lips, because these can be a signs of impending severe allergic reaction, which can be fatal. Rarely, allopurinol can cause nerve, kidney, and bone marrow damage.  Dose: 300 mg/day   Treatment  Drink 2 to 3 L of fluid daily.  Consume a moderate amount of protein. Limit meat, fish and poultry to 4 - 6 oz per day. Try other low-purine good protein foods such as  low fat dairy products, tofu and eggs.  Limit fat intake by choosing leaner meats, foods prepared with less oils and lower fat dairy products   Aside from avoiding high purine foods, maintaining a healthy body weight is important for gout patients as well. Obesity can result in increased uric acid production by the body. Follow a well-balanced diet to lose excess body weight. Do not follow a high-protein low-carb diet as this can worsen gout conditions.  Keep the urine pH high (basic or non-acidic)  Colchicine, probenecid, allopurinol, sodium bicarbonate.    Prevention  If you are at risk for gout, you should   Eat a low-cholesterol, low-fat diet. People with gout have a higher risk for heart disease. This diet would not only lower your risk for gout but also your risk for heart disease.   Slowly lose weight. This can lower your uric acid levels. Losing weight too rapidly can occasionally precipitate gout attacks.   Restrict your?intake of alcohol, especially beer.   If you have had an attack of gouty arthritis, you should do all of the above and follow the regimen prescribed by your physician. The adequate prevention of gouty arthritis may involve lifelong medical therapy.    Balanced Diet  According to the American Medical Association, a balanced diet for people with gout include foods:  High in complex carbohydrates (whole grains, fruits, vegetables)   Low in protein (15% of calories and sources should be soy, lean meats, poultry)   No more than 30% of calories from fat (10% animal fat)    Beneficial Foods  Foods which may be beneficial to people with gout include:  Dark berries may contain chemicals that lower uric acid and reduce inflammation.   Tofu which is made from soybeans may be a better choice than meats.   Certain fatty acids found in certain fish such as salmon, flax or olive oil, or nuts may possess some anti-inflammatory benefits.    Recommended Foods to Eat  Fresh cherries, strawberries, blueberries, and other red-blue berries   Bananas   Celery   Tomatoes   Vegetables including kale, cabbage, parsley,  green-leafy vegetables   Foods high in bromelain (pineapple)   Foods high in vitamin C (red cabbage, red bell peppers, tangerines, mandarins, oranges, potatoes)   Drink fruit juices and purified water (8 glasses of water per day)   Low-fat dairy products   Complex carbohydrates (breads, cereals, pasta, rice, as well as aforementioned vegetables and fruits)   Chocolate, cocoa   Coffee, tea   Carbonated beverages   Essential fatty acids (tuna and salmon, flaxseed, nuts, seeds)   Tofu, although a legume and made from soybeans, may be a better choice than meat    Foods to Avoid  Diets which are high in purines and high in protein have long been suspected of causing an increased risk of gout .    According to the American Medical Association, purine-containing foods include:  Beer, other alcoholic beverages. Limit alcohol consumption to 1 drink 3 times a week.  Anchovies, sardines in oil, fish roes, herring, Mackerel, Scallops, mussels  Yeast. (Beer), whole grain breads and cereals, oatmeal  Organ meat (liver, kidneys, brains, sweetbreads)   Processed meats (hot dogs, lunch meats, etc.),  Legumes (dried beans, peas, lima beans)   Meat extracts, consomm, broth, bouillon, gravies. (e.g Oxo, Bovril)  Mushrooms, spinach, asparagus, cauliflower, mushrooms.  Chicken, duck, ham, [place], Game meats   Fried foods, roasted nuts, any food cooked in oil (heated  oil destroys vitamin E)  Rich foods (cakes, sugar products, white flour products)  Dried fruits  Caffeine  Eggs    NOTE: It is important to remember that purines are found in all protein foods. All sources of purines should not be eliminated.    Urine at pH 7.0 is neutral and elimination of uric acid decreases by approximately 50% at pH 6.5. The pka of uric acid is 5.75.  In urine at pH 5.0, only 15% of uric acid exists in solution. The solubility increases more than 10-fold at pH 7.0 and more than 100-fold at pH 8.0.    Extremely Alkaline  Forming Foods - pH 8.5 to 9.0:  Lemons, Watermelon , Agar Agar , Cantaloupe, Cayenne (Capsicum), Dried dates & figs, Kelp, [place], [address] root, Limes, [place], Melons, Papaya, [place], [place] grapes (sweet), Watercress, Seaweed    Moderate Alkaline - pH 7.5 to 8.0  Apples (sweet), Apricots, Alfalfa sprouts Arrowroot, Avocados, Bananas (ripe), Berries, Carrots, Celery, Currants, Dates & figs (fresh), Garlic , Gooseberry, Grapes (less sweet), Grapefruit, Guavas, Herbs (leafy green), Lettuce (leafy green), Nectarine, Peaches (sweet), Pears (less sweet), Peas (fresh sweet), Persimmon, Pumpkin (sweet), Sea salt , Spinach, Apples (sour), Bamboo shoots, Beans (fresh green), Beets, Bell Pepper, Broccoli, Cabbage, Cauliflower, Carob , Daikon, Ginger (fresh), Grapes (sour), Kale, Kohlrabi, Lettuce (pale green), Oranges, Parsnip, Peaches (less sweet), Peas (less sweet), Potatoes & skin, Pumpkin (less sweet), Raspberry, Sapote, Strawberry, Squash , Sweet corn (fresh), Tamari , Turnip, Sour Dairy    Slightly Alkaline to Neutral pH 7.0  Almonds , Artichokes ([place]), Barley-Malt (sweetener-[place]), [employer] Rice Syrup, [employer], Cherries, Coconut (fresh), Cucumbers, [employer] plant, Honey (raw), Leeks, Miso, Okra, Olives ripe , [place], [place] (home made with brown rice vinegar), Radish, Sea salt , Spices , Taro, Tomatoes (sweet), Vinegar (sweet brown rice), Water Chestnut, Amaranth, Artichoke (globe), Chestnuts (dry roasted), Egg yolks (soft cooked), Goat's milk and whey (raw) , Horseradish, Mayonnaise (home made), Millet, Olive oil, Quinoa, Rhubarb, Sesame seeds (whole) , Soy beans (dry), Sprouted grains , Tempeh, Tofu, Tomatoes (less sweet)    Slightly Acid to Neutral pH 7.0  Barley malt syrup, Barley, Bran, Cashews, Cereals (unrefined with honey-fruit-maple syrup), Cornmeal, Fructose, Honey (pasteurized), Lentils, Macadamias, Maple syrup (unprocessed),Low Fat Milk (homogenized) and most processed dairy products,  Molasses organic , Nutmeg, Mustard, Pistachios, Popcorn (plain), Rice or wheat crackers (unrefined), Rye (grain), Rye bread (organic sprouted), Seeds (pumpkin & sunflower), Walnuts Blueberries, [place] nuts, Butter (salted), Cheeses (mild & crumbly) , Crackers (unrefined rye), Dried beans (mung, adzuki, pinto, kidney, garbanzo) , Dry coconut, Egg whites, Goats milk (homogenized), Olives (pickled), Pecans, Plums , Prunes , Butter (fresh unsalted), Cream (fresh & raw), Milk (raw cow's) , Whey (cow's)     ACID FORMING FOODS FATS & OILS  Avocado Oil, Canola Oil, Corn Oil, Hemp Seed Oil, Flax Oil, Lard, Olive Oil, Safflower Oil, Sesame Oil, Sunflower Oil    FRUITS  Cranberries    GRAINS  Rice Cakes, Wheat Cakes, Amaranth, Barley, Buckwheat, Oats (rolled), Quinoa, Rice, Rye, Spelt, Kamut, Wheat, Hemp Seed Flour    NUTS & BUTTERS  Cashews, [place] Nuts, Peanuts, Processed Peanut Butter, Pecans, Tahini    ANIMAL PROTEIN  Beef, Carp, Clams, Fish, [place] Burn, [place], Mussels, [place], [employer], Rabbit, [place], Shrimp, [place], [place], [place], Investment banker, corporate    PASTA (WHITE)  Noodles, Macaroni, Spaghetti Distilled Vinegar, Wheat Germ    BEANS & LEGUMES  Black Beans, Chick Peas, Green Peas, Kidney Beans, Lentils, [place] Beans, Pinto Beans, Red Beans, Soy Beans, Soy Milk,  White Beans, Rice Milk, Almond Milk    DRUGS & CHEMICALS  Aspartame, Chemicals, Drugs (Medicinal), Drugs (Psychedelic), Pesticides, Herbicides    ALCOHOL  Beer, Spirits, Hard Liquor, Wine    ACTIVITIES  Overwork, Anger, Fear, Jealousy, Stress    Moderate Acid - pH 6.0 to 6.5  Cigarette tobacco, Cream of Wheat (unrefined), Fish, Fruit juices with sugar, Maple syrup (processed), Molasses (sulphured), Pickles (commercial), Breads (refined) of corn, oats, rice & rye, Cereals (refined), corn flakes, Shellfish, Wheat germ, Whole Wheat foods , Wine , Yogurt (sweetened) Bananas (green), Buckwheat, Cheeses (sharp), Corn & rice breads, Egg whole (cooked hard),  Ketchup, Mayonnaise, Oats, Pasta (whole grain), Pastry (wholegrain & honey), Peanuts, Potatoes (with no skins), Popcorn (with salt & butter), Rice (basmati), Rice (brown), Soy sauce (commercial), Tapioca, Wheat bread (sprouted organic)    Extremely Acid Forming Foods - pH 5.0 to 5.5  Artificial sweeteners, Beef, Carbonated soft drinks & fizzy drinks , Cigarettes (tailor made), Drugs, Flour (white wheat), Goat, Lamb, Pastries & cakes from white flour, Pork, Sugar (white) , Beer , Brown sugar , Chicken, [place], Chocolate, Coffee , Custard with white sugar, Jams, Jellies, Liquor , Pasta (white), Rabbit, [place], Table salt refined & iodized, Tea black, [place], Wheat bread, White rice, White vinegar (processed).    Research Update:   A recent study published in the [place] Journal of Medicine on Aug 26, 2002 revealed that high intake of low-fat dairy products indeed reduces the risk of gout by 50%. It is unknown why low-fat dairy products offer a protective effect.   Unfortunately, no natural supplements are proven effective to prevent or alleviate onset of acute gout attacks. The most effective treatment for gout attack is medication.   ____________________________________________________________________________________________

## 2023-09-22 ENCOUNTER — Encounter: Payer: Self-pay | Admitting: Pain Medicine

## 2023-09-22 ENCOUNTER — Ambulatory Visit
Admission: RE | Admit: 2023-09-22 | Discharge: 2023-09-22 | Disposition: A | Discharge status: Home / Self Care | Attending: Pain Medicine | Admitting: Pain Medicine

## 2023-09-22 ENCOUNTER — Ambulatory Visit: Admitting: Pain Medicine

## 2023-09-22 ENCOUNTER — Ambulatory Visit
Admission: RE | Admit: 2023-09-22 | Discharge: 2023-09-22 | Disposition: A | Discharge status: Home / Self Care | Source: Ambulatory Visit | Attending: Pain Medicine | Admitting: Pain Medicine

## 2023-09-22 VITALS — BP 141/74 | HR 79 | Temp 97.9°F | Resp 18 | Ht 64.0 in | Wt 284.0 lb

## 2023-09-22 DIAGNOSIS — M25561 Pain in right knee: Secondary | ICD-10-CM | POA: Diagnosis not present

## 2023-09-22 DIAGNOSIS — M79604 Pain in right leg: Secondary | ICD-10-CM | POA: Diagnosis not present

## 2023-09-22 DIAGNOSIS — G8929 Other chronic pain: Secondary | ICD-10-CM | POA: Insufficient documentation

## 2023-09-22 DIAGNOSIS — M1611 Unilateral primary osteoarthritis, right hip: Secondary | ICD-10-CM | POA: Diagnosis not present

## 2023-09-22 DIAGNOSIS — E669 Obesity, unspecified: Secondary | ICD-10-CM | POA: Insufficient documentation

## 2023-09-22 DIAGNOSIS — Z79899 Other long term (current) drug therapy: Secondary | ICD-10-CM

## 2023-09-22 DIAGNOSIS — M4805 Spinal stenosis, thoracolumbar region: Secondary | ICD-10-CM | POA: Diagnosis not present

## 2023-09-22 DIAGNOSIS — E559 Vitamin D deficiency, unspecified: Secondary | ICD-10-CM | POA: Diagnosis not present

## 2023-09-22 DIAGNOSIS — Z6841 Body Mass Index (BMI) 40.0 and over, adult: Secondary | ICD-10-CM

## 2023-09-22 DIAGNOSIS — M899 Disorder of bone, unspecified: Secondary | ICD-10-CM | POA: Diagnosis not present

## 2023-09-22 DIAGNOSIS — M1A0710 Idiopathic chronic gout, right ankle and foot, without tophus (tophi): Secondary | ICD-10-CM | POA: Diagnosis not present

## 2023-09-22 DIAGNOSIS — G894 Chronic pain syndrome: Secondary | ICD-10-CM

## 2023-09-22 DIAGNOSIS — R768 Other specified abnormal immunological findings in serum: Secondary | ICD-10-CM

## 2023-09-22 DIAGNOSIS — E1169 Type 2 diabetes mellitus with other specified complication: Secondary | ICD-10-CM | POA: Diagnosis not present

## 2023-09-22 DIAGNOSIS — R7309 Other abnormal glucose: Secondary | ICD-10-CM | POA: Diagnosis not present

## 2023-09-22 DIAGNOSIS — Z789 Other specified health status: Secondary | ICD-10-CM

## 2023-09-22 DIAGNOSIS — M1711 Unilateral primary osteoarthritis, right knee: Secondary | ICD-10-CM | POA: Diagnosis not present

## 2023-09-22 DIAGNOSIS — M25551 Pain in right hip: Secondary | ICD-10-CM | POA: Diagnosis not present

## 2023-09-22 DIAGNOSIS — M545 Low back pain, unspecified: Secondary | ICD-10-CM | POA: Insufficient documentation

## 2023-09-22 DIAGNOSIS — M47816 Spondylosis without myelopathy or radiculopathy, lumbar region: Secondary | ICD-10-CM | POA: Diagnosis not present

## 2023-09-22 DIAGNOSIS — R700 Elevated erythrocyte sedimentation rate: Secondary | ICD-10-CM | POA: Diagnosis not present

## 2023-09-22 DIAGNOSIS — M5136 Other intervertebral disc degeneration, lumbar region with discogenic back pain only: Secondary | ICD-10-CM | POA: Diagnosis not present

## 2023-09-22 NOTE — Progress Notes (Signed)
 Patient refused referral for Physical Therapy stating "It will not help and make things worse". Patient refused scheduling staff to send the PT referral.   Dr. Shauna Hugh has been made aware.

## 2023-09-23 LAB — COMP. METABOLIC PANEL (12)

## 2023-09-23 LAB — SEDIMENTATION RATE

## 2023-09-23 LAB — HEMOGLOBIN A1C

## 2023-09-23 LAB — VITAMIN B12

## 2023-09-23 LAB — 25-HYDROXY VITAMIN D LCMS D2+D3

## 2023-09-23 LAB — RHEUMATOID FACTOR

## 2023-09-23 LAB — MAGNESIUM

## 2023-09-23 LAB — URIC ACID

## 2023-09-23 LAB — C-REACTIVE PROTEIN

## 2023-09-28 LAB — SPECIMEN STATUS REPORT

## 2023-09-28 LAB — COMPLIANCE DRUG ANALYSIS, UR

## 2023-10-01 ENCOUNTER — Other Ambulatory Visit

## 2023-10-03 LAB — COMP. METABOLIC PANEL (12)
AST: 16 IU/L (ref 0–40)
Albumin: 4.3 g/dL (ref 3.8–4.8)
Alkaline Phosphatase: 167 IU/L — ABNORMAL HIGH (ref 44–121)
BUN/Creatinine Ratio: 30 — ABNORMAL HIGH (ref 12–28)
BUN: 19 mg/dL (ref 8–27)
Bilirubin Total: 0.5 mg/dL (ref 0.0–1.2)
Calcium: 9.9 mg/dL (ref 8.7–10.3)
Chloride: 98 mmol/L (ref 96–106)
Creatinine, Ser: 0.64 mg/dL (ref 0.57–1.00)
Globulin, Total: 2.7 g/dL (ref 1.5–4.5)
Glucose: 139 mg/dL — ABNORMAL HIGH (ref 70–99)
Potassium: 3.7 mmol/L (ref 3.5–5.2)
Sodium: 145 mmol/L — ABNORMAL HIGH (ref 134–144)
Total Protein: 7 g/dL (ref 6.0–8.5)
eGFR: 94 mL/min/{1.73_m2} (ref >59)

## 2023-10-03 LAB — C-REACTIVE PROTEIN: CRP: 4 mg/L (ref 0–10)

## 2023-10-03 LAB — HEMOGLOBIN A1C
Est. average glucose Bld gHb Est-mCnc: 166 mg/dL
Hgb A1c MFr Bld: 7.4 % — ABNORMAL HIGH (ref 4.8–5.6)

## 2023-10-03 LAB — MAGNESIUM: Magnesium: 2.2 mg/dL (ref 1.6–2.3)

## 2023-10-03 LAB — 25-HYDROXY VITAMIN D LCMS D2+D3
25-Hydroxy, Vitamin D-2: 19 ng/mL
25-Hydroxy, Vitamin D-3: 11 ng/mL
25-Hydroxy, Vitamin D: 30 ng/mL

## 2023-10-03 LAB — SEDIMENTATION RATE: Sed Rate: 54 mm/h — ABNORMAL HIGH (ref 0–40)

## 2023-10-03 LAB — URIC ACID: Uric Acid: 9.5 mg/dL — ABNORMAL HIGH (ref 3.1–7.9)

## 2023-10-03 LAB — VITAMIN B12: Vitamin B-12: 478 pg/mL (ref 232–1245)

## 2023-10-03 LAB — SPECIMEN STATUS REPORT

## 2023-10-03 LAB — RHEUMATOID FACTOR: Rheumatoid fact SerPl-aCnc: 17 [IU]/mL — ABNORMAL HIGH (ref <14.0)

## 2023-10-12 NOTE — Progress Notes (Addendum)
 PROVIDER NOTE: Interpretation of information contained herein should be left to medically-trained personnel. Specific patient instructions are provided elsewhere under "Patient Instructions" section of medical record. This document was created in part using AI and STT-dictation technology, any transcriptional errors that may result from this process are unintentional.  Patient: Michelle Marks  Service: E/M   PCP: Michelle Pyles, NP  DOB: [birth year]  DOS: 10/13/2023  Provider: Candi Chafe, MD  MRN: [id number]  Delivery: Face-to-face  Specialty: Interventional Pain Management  Type: Established Patient  Setting: Ambulatory outpatient facility  Specialty designation: 09  Referring Prov.: Michelle Pyles, NP  Location: Outpatient office facility       Primary Reason(s) for Visit: Encounter for evaluation before starting new chronic pain management plan of care (Level of risk: moderate) CC: Back Pain (lower), Knee Pain (right), and Hip Pain (Right states from having xrays and having to move)  HPI  [name] is a 73 y.o. year old, female patient, who comes today for a follow-up evaluation to review the test results and decide on a treatment plan. She has Morbid obesity with body mass index (BMI) of 45.0 to 49.9 in adult [facility]); Idiopathic chronic gout of right foot without tophus; Psoriasis; Rheumatoid factor positive; Hyperlipidemia associated with type 2 diabetes mellitus (HCC); Vitamin D  deficiency; GERD without esophagitis; Hypothyroidism; Type 2 diabetes mellitus with obesity (HCC); Lymphedema in adult patient; White coat syndrome without diagnosis of hypertension; Proteinuria; Chronic back pain; Chronic pain syndrome; Pharmacologic therapy; Disorder of skeletal system; Problems influencing health status; Chronic low back pain (1ry area of Pain) (Bilateral) w/o sciatica; Chronic lower extremity pain (2ry area of Pain) (Right); Chronic knee pain (3ry area of Pain) (Right); Osteoarthritis of hip  (Right); Osteoarthritis of knee (Right); Elevated hemoglobin A1c; Elevated sed rate; Elevated uric acid in blood; Chronic hip pain (Right); 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 Her primarily concern today is the Back Pain (lower), Knee Pain (right), and Hip Pain (Right states from having xrays and having to move)  Pain Assessment: Location: Right, Lower Back Radiating: radiates down back of right leg Onset: More than a month ago Duration: Chronic pain Quality: Aching, Constant, Discomfort, Nagging Severity: 9 /10 (subjective, self-reported pain score)  Effect on ADL: Difficulty moving, prolonged walking and standing Timing: Constant Modifying factors: not moving BP: 124/63  HR: 75  [name] comes in today for a follow-up visit after her initial evaluation on 09/22/2023. Today we went over the results of her tests. These were explained in "Layman's terms". During today's appointment we went over my diagnostic impression, as well as the proposed treatment plan.  Review of initial evaluation (09/22/2023): "Michelle Marks is a 73 year old female who presents with chronic lower back and right leg pain.   She experiences chronic pain primarily in her lower back and right leg. The pain originates from her lower back, specifically on the right side, and radiates down to the top of her right leg. It is severe enough to wake her from sleep within an hour to an hour and a half, subsiding almost immediately when she sits up. The pain is positional, as she cannot sleep on her back without discomfort. She has not undergone any surgeries, physical therapy, or received any injections for her back pain. No recent imaging studies have been conducted on her back.   In addition  to her back pain, she experiences pain in her right leg, extending from the middle of her buttocks down to  just below her knee, primarily affecting the lateral aspect of the leg. The pain is not constant but occurs daily, particularly with movement, such as standing up or getting out of a car. She also reports pain in her right knee, which has not been replaced, unlike her left knee which was replaced in 2008 due to being bone on bone. She has not had any recent imaging or injections for her right knee. She mentions having lymphedema and irregular edema, which she believes contributes to her leg pain due to the heaviness of her legs. No pain, numbness, or weakness in her left leg.   She has a history of a positive rheumatoid factor and high uric acid levels, although she has only experienced one gout attack in her toe. She does not follow a specific diet for gout and is not on medication for it due to sensitivity to side effects."  Review of diagnostic tests ordered on 09/22/2023:  Diagnostic lab work: Comprehensive metabolic panel demonstrated a mildly elevated blood glucose level however, the patient had not been asked to fast.  In addition the metabolic panel also showed an elevated BUN/creatinine ratio of 30 (normal 12-28).  Very mildly elevated sodium levels at 145 mmol/L (normal 134-144).  In addition, alkaline phosphatase was found to be elevated at 167 international units/L (normal 44-121).  Hemoglobin A1c was elevated at 7.4 (normal 4.8 to 5.6%).  Rheumatoid factor, sed rate, and uric acid were all found to be elevated and compatible with patient's history of possible seropositive rheumatoid arthritis and gout. Diagnostic imaging: Diagnostic x-rays of the right hip showed severe osteoarthrosis of the right hip with narrowing of the joint space, reactive sclerosis, and subchondral cyst changes involving the femoral head and the acetabulum with marginal osteophytes of the acetabulum.  Reactive sclerosis changes of the femoral head may suggest a possible avascular necrosis.  Diagnostic x-rays of the right  knee show severe degenerative osteoarthrosis with narrowing of the medial, lateral compartments of the knee particularly the lateral compartment with marginal osteophytes of the femoral condyle and tibial plateau with significant femoral patellar osteoarthrosis.  Diagnostic x-rays of the lumbar spine showed multilevel degenerative disc disease and facet joint disease with disc space narrowing at T12-L1, L1-2, L3-4, L4-5, and advanced degenerative facet disease affecting the L3-4, L4-5, and L5-S1 levels.  Discussed the use of AI scribe software for clinical note transcription with the patient, who gave verbal consent to proceed.  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  She experiences significant hip pain, which became prominent after recent x-rays suggested possible aseptic necrosis of the femoral head. Previously, she did not have significant hip pain, but now describes it as severe, impacting her ability to lie on her back, complicating imaging procedures like MRIs.  She has a history of knee issues, having undergone knee surgery previously, which resulted in significant scar tissue formation. She is concerned about similar outcomes if hip surgery is required.  She experiences back pain attributed to protruding discs and facet joint arthritis. The pain radiates down her leg and disrupts her sleep, waking her after about an hour. Sitting up alleviates the pain somewhat.  Her hemoglobin A1c has been elevated, indicating a progression from prediabetes to diabetes over  the past ten years.  She has a history of elevated uric acid levels and gout, managed with dietary changes and medications like colchicine, which she discontinued due to gastrointestinal side effects. She is hesitant to start allopurinol  due to concerns about side effects.  Her rheumatoid factor is  elevated, and she has been advised to follow up with a rheumatologist. She has not had recent consultations with her rheumatologist but has been managing her condition with her nurse practitioner.      Patient presented with interventional treatment options. [name] was informed that I will not be providing medication management. Pharmacotherapy evaluation including recommendations may be offered, if specifically requested.   Controlled Substance Pharmacotherapy Assessment REMS (Risk Evaluation and Mitigation Strategy)  Opioid Analgesic: None MME/day: 0 mg/day  Pill Count: None expected due to no prior prescriptions written by our practice. Sibyl Drafts, RN  10/13/2023 12:57 PM  Sign when Signing Visit Safety precautions to be maintained throughout the outpatient stay will include: orient to surroundings, keep bed in low position, maintain call bell within reach at all times, provide assistance with transfer out of bed and ambulation.    Pharmacokinetics: Liberation and absorption (onset of action): WNL Distribution (time to peak effect): WNL Metabolism and excretion (duration of action): WNL         Pharmacodynamics: Desired effects: Analgesia: [name] reports >50% benefit. Functional ability: Patient reports that medication allows her to accomplish basic ADLs Clinically meaningful improvement in function (CMIF): Sustained CMIF goals met Perceived effectiveness: Described as relatively effective, allowing for increase in activities of daily living (ADL) Undesirable effects: Side-effects or Adverse reactions: None reported Monitoring: [place] PMP: PDMP reviewed during this encounter. Online review of the past 43-month period previously conducted. Not applicable at this point since we have not taken over the patient's medication management yet. List of other Serum/Urine Drug Screening Test(s):  No results found for: "AMPHSCRSER", "BARBSCRSER", "BENZOSCRSER", "COCAINSCRSER",  "COCAINSCRNUR", "PCPSCRSER", "THCSCRSER", "THCU", "CANNABQUANT", "OPIATESCRSER", "OXYSCRSER", "PROPOXSCRSER", "ETH", "CBDTHCR", "D8THCCBX", "D9THCCBX" List of all UDS test(s) done:  Lab Results  Component Value Date   SUMMARY FINAL 09/22/2023   Last UDS on record: Summary  Date Value Ref Range Status  09/22/2023 FINAL  Final    Comment:    ==================================================================== Compliance Drug Analysis, Ur ==================================================================== Test                             Result       Flag       Units    NO DRUGS DETECTED. ==================================================================== Test                      Result    Flag   Units      Ref Range   Creatinine              60               mg/dL      >=40 ==================================================================== Declared Medications:  Medication list was not provided. ==================================================================== For clinical consultation, please call [phone]. ====================================================================    UDS interpretation: No unexpected findings.          Medication Assessment Form: Not applicable. No opioids. Treatment compliance: Not applicable Risk Assessment Profile: Aberrant behavior: See initial evaluations. None observed or detected today Comorbid factors increasing risk of overdose: See initial evaluation. No additional risks detected today Opioid risk tool (ORT):  10/13/2023    1:04 PM  Opioid Risk   Alcohol 0  Illegal Drugs 0  Rx Drugs 0  Age between 16-45 years  0  History of Preadolescent Sexual Abuse 0  Psychological Disease 0  Depression 0  Opioid Risk Tool Scoring 0  Opioid Risk Interpretation Low Risk    ORT Scoring interpretation table:  Score <3 = Low Risk for SUD  Score between 4-7 = Moderate Risk for SUD  Score >8 = High Risk for Opioid Abuse   Risk of  substance use disorder (SUD): Low  Risk Mitigation Strategies:  Patient opioid safety counseling: No controlled substances prescribed. Patient-Prescriber Agreement (PPA): No agreement signed.  Controlled substance notification to other providers: None required. No opioid therapy.  Pharmacologic Plan: Non-opioid analgesic therapy offered. Interventional alternatives discussed.             Laboratory Chemistry Profile   Renal Lab Results  Component Value Date   BUN CANCELED 09/22/2023   BUN 19 09/22/2023   CREATININE CANCELED 09/22/2023   CREATININE 0.64 09/22/2023   BCR 30 (H) 09/22/2023     Electrolytes Lab Results  Component Value Date   NA CANCELED 09/22/2023   NA 145 (H) 09/22/2023   K CANCELED 09/22/2023   K 3.7 09/22/2023   CL CANCELED 09/22/2023   CL 98 09/22/2023   CALCIUM  CANCELED 09/22/2023   CALCIUM  9.9 09/22/2023   MG CANCELED 09/22/2023   MG 2.2 09/22/2023     Hepatic Lab Results  Component Value Date   AST CANCELED 09/22/2023   AST 16 09/22/2023   ALT 13 07/30/2023   ALBUMIN CANCELED 09/22/2023   ALBUMIN 4.3 09/22/2023   ALKPHOS CANCELED 09/22/2023   ALKPHOS 167 (H) 09/22/2023     ID No results found for: "LYMEIGGIGMAB", "HIV", "SARSCOV2NAA", "STAPHAUREUS", "MRSAPCR", "HCVAB", "PREGTESTUR", "RMSFIGG", "QFVRPH1IGG", "QFVRPH2IGG"   Bone Lab Results  Component Value Date   VD25OH 30.5 11/20/2022   25OHVITD1 CANCELED 09/22/2023   25OHVITD1 30 09/22/2023   25OHVITD2 19 09/22/2023   25OHVITD3 11 09/22/2023     Endocrine Lab Results  Component Value Date   GLUCOSE CANCELED 09/22/2023   GLUCOSE 139 (H) 09/22/2023   HGBA1C CANCELED 09/22/2023   HGBA1C 7.4 (H) 09/22/2023   TSH 3.250 07/30/2023   FREET4 1.34 07/30/2023     Neuropathy Lab Results  Component Value Date   VITAMINB12 CANCELED 09/22/2023   VITAMINB12 478 09/22/2023   HGBA1C CANCELED 09/22/2023   HGBA1C 7.4 (H) 09/22/2023     CNS No results found for: "COLORCSF", "APPEARCSF",  "RBCCOUNTCSF", "WBCCSF", "POLYSCSF", "LYMPHSCSF", "EOSCSF", "PROTEINCSF", "GLUCCSF", "JCVIRUS", "CSFOLI", "IGGCSF", "LABACHR", "ACETBL"   Inflammation (CRP: Acute  ESR: Chronic) Lab Results  Component Value Date   CRP CANCELED 09/22/2023   CRP 4 09/22/2023   ESRSEDRATE CANCELED 09/22/2023   ESRSEDRATE 54 (H) 09/22/2023     Rheumatology Lab Results  Component Value Date   RF CANCELED 09/22/2023   RF 17.0 (H) 09/22/2023   ANA Negative 09/25/2021   LABURIC CANCELED 09/22/2023   LABURIC 9.5 (H) 09/22/2023     Coagulation Lab Results  Component Value Date   PLT 239 11/20/2022     Cardiovascular Lab Results  Component Value Date   HGB 14.1 11/20/2022   HCT 44.2 11/20/2022     Screening No results found for: "SARSCOV2NAA", "COVIDSOURCE", "STAPHAUREUS", "MRSAPCR", "HCVAB", "HIV", "PREGTESTUR"   Cancer No results found for: "CEA", "CA125", "LABCA2"   Allergens No results found for: "ALMOND", "APPLE", "ASPARAGUS", "AVOCADO", "BANANA", "BARLEY", "BASIL", "BAYLEAF", "  GREENBEAN", "LIMABEAN", "WHITEBEAN", "BEEFIGE", "REDBEET", "BLUEBERRY", "BROCCOLI", "CABBAGE", "MELON", "CARROT", "CASEIN", "CASHEWNUT", "CAULIFLOWER", "CELERY"     Note: Lab results reviewed.  Recent Diagnostic Imaging Review  Lumbosacral Imaging: Lumbar DG Bending views: Results for orders placed during the hospital encounter of 09/22/23 DG Lumbar Spine Complete W/Bend  Narrative CLINICAL DATA:  Low back pain  EXAM: LUMBAR SPINE - COMPLETE WITH BENDING VIEWS  COMPARISON:  MRI lumbar September 20, 2006  FINDINGS: Multilevel degenerative disc disease with narrowing T12-L1, L1-L2 L3-L4, L4-L5 disc spaces with diffuse anterior osteophytes and mild posterior bony spondylosis.  There is advanced degenerative facet disease  L3-L4 L4-5 and L5-S1  No spondylolysis or spondylolisthesis.  No obvious fractures  IMPRESSION: Multilevel degenerative disc disease and facet disease.   Electronically Signed By:  Fredrich Jefferson M.D. On: 09/24/2023 19:35  Hip Imaging: Hip-R DG 2-3 views: Results for orders placed during the hospital encounter of 09/22/23 DG HIP UNILAT W OR W/O PELVIS 2-3 VIEWS RIGHT  Narrative CLINICAL DATA:  Right hip pain  EXAM: DG HIP (WITH OR WITHOUT PELVIS) 2-3V RIGHT  COMPARISON:  None Available.  FINDINGS: Severe osteoarthrosis of the right hip with narrowing of the joint space, reactive sclerosis and subchondral cystic change involving the femoral head and the acetabulum with marginal osteophytes of the acetabulum. No obvious fractures. Reactive sclerosis changes of the femoral head the possibility of avascular necrosis not excluded.  IMPRESSION: Severe osteoarthrosis of the right hip.   Electronically Signed By: Fredrich Jefferson M.D. On: 09/24/2023 19:36  Knee Imaging: Knee-R DG 4 views: Results for orders placed during the hospital encounter of 09/22/23 DG Knee Complete 4 Views Right  Narrative CLINICAL DATA:  Right knee pain  EXAM: RIGHT KNEE - COMPLETE 4+ VIEW  COMPARISON:  None Available.  FINDINGS: Severe degenerative osteoarthrosis with narrowing of the medial, lateral compartments of the knee particularly the lateral compartment with marginal osteophytes of the femoral condyle and tibial plateau with significant femoropatellar osteoarthrosis  IMPRESSION: Severe degenerative osteoarthrosis.   Electronically Signed By: Fredrich Jefferson M.D. On: 09/24/2023 19:35  Complexity Note: Imaging results reviewed.                         Meds   Current Outpatient Medications:    Accu-Chek FastClix Lancets MISC, 1 each by Does not apply route daily before breakfast., Disp: 100 each, Rfl: 3   atorvastatin  (LIPITOR) 40 MG tablet, Take 1 tablet (40 mg total) by mouth daily., Disp: 90 tablet, Rfl: 3   augmented betamethasone  dipropionate (DIPROLENE -AF) 0.05 % cream, Apply 1 Application topically 2 (two) times daily as needed (to affected skin areas).,  Disp: 50 g, Rfl: 4   Blood Glucose Monitoring Suppl (ACCU-CHEK GUIDE) w/Device KIT, To check blood sugar twice a day with goal fasting in morning <130 and goal 2 hours after meal <180.  Write all checks down for provider visits., Disp: 1 kit, Rfl: 0   diazepam  (VALIUM ) 5 MG tablet, Take 1 tablet (5 mg total) by mouth 60 (sixty) minutes before procedure for 1 dose. 1 tab PO 60 min pre-MRI. If still anxious, take 2nd tab 15 min just before MRI. Max: 2 tbs (10 mg). Avoid taking opioid pain medications within 4 hours of taking valium . Must have a driver. Do not drive or operate machinery x 24 hours after taking this medication., Disp: 4 tablet, Rfl: 0   ergocalciferol  (VITAMIN D2) 1.25 MG (50000 UT) capsule, Take 1 capsule (50,000 Units total) by  mouth once a week., Disp: 12 capsule, Rfl: 4   esomeprazole  (NEXIUM ) 40 MG capsule, TAKE 1 CAPSULE BY MOUTH DAILY, Disp: 90 capsule, Rfl: 2   glucose blood test strip, To check blood sugar daily prior to eating in morning, with goal less the 130., Disp: 100 each, Rfl: 12   levothyroxine  (SYNTHROID ) 75 MCG tablet, Take 1 tablet (75 mcg total) by mouth daily before breakfast., Disp: 90 tablet, Rfl: 2   torsemide  (DEMADEX ) 100 MG tablet, Take 1 tablet (100 mg total) by mouth daily., Disp: 90 tablet, Rfl: 2  ROS  Constitutional: Denies any fever or chills Gastrointestinal: No reported hemesis, hematochezia, vomiting, or acute GI distress Musculoskeletal: Denies any acute onset joint swelling, redness, loss of ROM, or weakness Neurological: No reported episodes of acute onset apraxia, aphasia, dysarthria, agnosia, amnesia, paralysis, loss of coordination, or loss of consciousness  Allergies  [name] is allergic to sulfa antibiotics.  PFSH  Drug: [name]  reports no history of drug use. Alcohol:  reports current alcohol use. Tobacco:  reports that she quit smoking about 20 years ago. Her smoking use included cigarettes. She started smoking about 52 years  ago. She has a 16.1 pack-year smoking history. She has never used smokeless tobacco. Medical:  has a past medical history of Anxiety, Arthritis, Difficult intubation, Edema, leg, Endometrial polyp, GERD (gastroesophageal reflux disease), Hyperlipidemia, and Morbid obesity (HCC) (10/24/2016). Surgical: [name]  has a past surgical history that includes Dilation and curettage of uterus; Hysteroscopy; Joint replacement (Left); Knee arthroscopy (Left); Tonsillectomy; Breast surgery (Right); Hysteroscopy with D & C (N/A, 12/02/2016); Cervical polypectomy (N/A, 12/02/2016); and Breast cyst excision (Right, age 79). Family: family history includes Cancer in her mother and sister; Diabetes in her father and mother; Hypertension in her mother; Pancreatitis in her father.  Constitutional Exam  General appearance: Well nourished, well developed, and well hydrated. In no apparent acute distress Vitals:   10/13/23 1255 10/13/23 1303  BP: (!) 94/50 124/63  Pulse: 75   Resp: 16   Temp: 98.1 F (36.7 C)   SpO2: 100%   Weight: 284 lb (128.8 kg)   Height: 5\' 4"  (1.626 m)    BMI Assessment: Estimated body mass index is 48.75 kg/m as calculated from the following:   Height as of this encounter: 5\' 4"  (1.626 m).   Weight as of this encounter: 284 lb (128.8 kg).  BMI interpretation table: BMI level Category Range association with higher incidence of chronic pain  <18 kg/m2 Underweight   18.5-24.9 kg/m2 Ideal body weight   25-29.9 kg/m2 Overweight Increased incidence by 20%  30-34.9 kg/m2 Obese (Class I) Increased incidence by 68%  35-39.9 kg/m2 Severe obesity (Class II) Increased incidence by 136%  >40 kg/m2 Extreme obesity (Class III) Increased incidence by 254%   Patient's current BMI Ideal Body weight  Body mass index is 48.75 kg/m. Ideal body weight: 54.7 kg (120 lb 9.5 oz) Adjusted ideal body weight: 84.3 kg (185 lb 15.3 oz)   BMI Readings from Last 4 Encounters:  10/13/23 48.75 kg/m   09/22/23 48.75 kg/m  07/31/23 48.92 kg/m  07/30/23 51.30 kg/m   Wt Readings from Last 4 Encounters:  10/13/23 284 lb (128.8 kg)  09/22/23 284 lb (128.8 kg)  07/31/23 285 lb (129.3 kg)  07/30/23 289 lb 9.6 oz (131.4 kg)    Psych/Mental status: Alert, oriented x 3 (person, place, & time)       Eyes: PERLA Respiratory: No evidence of acute respiratory distress  Assessment & Plan  Primary Diagnosis & Pertinent Problem List: The primary encounter diagnosis was Chronic pain syndrome. Diagnoses of Chronic low back pain (1ry area of Pain) (Bilateral) w/o sciatica, Chronic lower extremity pain (2ry area of Pain) (Right), Chronic knee pain (3ry area of Pain) (Right), Osteoarthritis of hip (Right), Osteoarthritis of knee (Right), Rheumatoid factor positive, Elevated hemoglobin A1c, Elevated sed rate, Elevated uric acid in blood, Chronic hip pain (Right), Question of aseptic necrosis of bone of hip (Right) (HCC), Pain aggravated by anxiety, Lumbar facet joint pain, and Lumbar facet joint arthropathy were also pertinent to this visit. Visit Diagnosis: 1. Chronic pain syndrome   2. Chronic low back pain (1ry area of Pain) (Bilateral) w/o sciatica   3. Chronic lower extremity pain (2ry area of Pain) (Right)   4. Chronic knee pain (3ry area of Pain) (Right)   5. Osteoarthritis of hip (Right)   6. Osteoarthritis of knee (Right)   7. Rheumatoid factor positive   8. Elevated hemoglobin A1c   9. Elevated sed rate   10. Elevated uric acid in blood   11. Chronic hip pain (Right)   12. Question of aseptic necrosis of bone of hip (Right) (HCC)   13. Pain aggravated by anxiety   14. Lumbar facet joint pain   15. Lumbar facet joint arthropathy    Problems updated and reviewed during this visit: Problem  Osteoarthritis of hip (Right)  Osteoarthritis of knee (Right)  Elevated Uric Acid in Blood  Chronic hip pain (Right)  Question of aseptic necrosis of bone of hip (Right) (HCC)  Pain Aggravated By  Anxiety  Lumbar Facet Joint Pain  Lumbar facet joint arthropathy  Rheumatoid Factor Positive  Elevated Hemoglobin A1c  Elevated Sed Rate    Plan of Care  Assessment and Plan    Aseptic necrosis of femoral head   X-ray findings suggest possible aseptic necrosis of the femoral head, indicating interrupted blood flow that may lead to bone death and significant pain. She is hesitant to undergo hip replacement surgery due to personal and mental health concerns. An MRI is necessary to confirm the diagnosis and assess the extent of necrosis. If confirmed, an orthopedic consultation will be required to explore surgical and non-surgical options. Order an MRI of the hip and knee. Prescribe Valium  10 mg, instructing her to take 5 mg 45 minutes before the MRI and 5 mg 10-15 minutes before the MRI.  Osteoarthritis of hip   Osteoarthritis of the hip is suspected based on x-ray findings, characterized by wear-and-tear arthritis. She reports intermittent hip pain, which has become more noticeable after the x-ray.  Facet joint arthritis   Suspected facet joint arthritis is contributing to lower back pain, likely due to osteoarthritis. The pain radiates to the buttocks and down the leg, affecting sleep. She is concerned about the impact of steroid injections on blood sugar levels due to elevated hemoglobin A1c. Steroid use could temporarily elevate blood sugar levels, but local anesthetic alone would not provide long-term relief. Consider a facet joint block with local anesthetic and steroid, discussing the potential for increased blood sugar with steroid use.  Elevated hemoglobin A1c   Elevated hemoglobin A1c indicates progression from prediabetes to diabetes.  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 She has gout, with one previous episode affecting the toe, and has been managing uric acid levels through diet without significant improvement.  Concerns about side effects have prevented the use of  allopurinol . Recommend follow-up with a rheumatologist for management of elevated uric acid and rheumatoid factor.      Pharmacotherapy (Medications Ordered): Meds ordered this encounter  Medications   diazepam  (VALIUM ) 5 MG tablet    Sig: Take 1 tablet (5 mg total) by mouth 60 (sixty) minutes before procedure for 1 dose. 1 tab PO 60 min pre-MRI. If still anxious, take 2nd tab 15 min just before MRI. Max: 2 tbs (10 mg). Avoid taking opioid pain medications within 4 hours of taking valium . Must have a driver. Do not drive or operate machinery x 24 hours after taking this medication.    Dispense:  4 tablet    Refill:  0    One time prescription. Do not request refills.   Procedure Orders         LUMBAR FACET(MEDIAL BRANCH NERVE BLOCK) MBNB     Lab Orders  No laboratory test(s) ordered today   Imaging Orders         MR HIP RIGHT WO CONTRAST         MR KNEE RIGHT WO CONTRAST     Referral Orders  No referral(s) requested today    Pharmacological management:  Opioid Analgesics: I will not be prescribing any opioids at this time Membrane stabilizer: I will not be prescribing any at this time Muscle relaxant: I will not be prescribing any at this time NSAID: I will not be prescribing any at this time Other analgesic(s): I will not be prescribing any at this time      Interventional Therapies  Risk Factors  Considerations  Medical Comorbidities:  MO (BMI>48)  RA  DM     Planned  Pending:   Diagnostic bilateral lumbar facet MBB #1    Under consideration:   Pending   Completed:   None at this time   Therapeutic  Palliative (PRN) options:   None established   Completed by other providers:   None reported       Provider-requested follow-up: Return for Eval-day (M,W), (F2F), for review of ordered MRIs. Recent Visits Date Type Provider Dept  09/22/23 Office Visit Renaldo Caroli, MD [facility]   Showing recent visits within past 90 days and meeting all other requirements Today's Visits Date Type Provider Dept  10/13/23 Office Visit Renaldo Caroli, MD [facility]  Showing today's visits and meeting all other requirements Future Appointments No visits were found meeting these conditions. Showing future appointments within next 90 days and meeting all other requirements   Primary Care Physician: Cannady, Jolene T, NP  Duration of encounter: 65 minutes.  Total time on encounter, as per AMA guidelines included both the face-to-face and non-face-to-face time personally spent by the physician and/or other qualified health care professional(s) on the day of the encounter (includes time in activities that require the physician or other qualified health care professional and does not include time in activities normally performed by clinical staff). Physician's time may include the following activities when performed: Preparing to see the patient (e.g., pre-charting review of records, searching for previously ordered imaging, lab work, and nerve conduction tests) Review of prior analgesic pharmacotherapies. Reviewing PMP Interpreting ordered tests (e.g., lab work, imaging, nerve conduction tests) Performing post-procedure evaluations, including interpretation of diagnostic procedures Obtaining and/or reviewing separately obtained history Performing a medically appropriate examination and/or evaluation Counseling and educating the patient/family/caregiver Ordering medications, tests, or procedures Referring and communicating with other health care professionals (when not separately reported) Documenting clinical information in the electronic  or other health record Independently interpreting results (not separately reported) and communicating results to the patient/ family/caregiver Care coordination (not separately reported)  Note by: Michelle Chafe, MD (TTS technology  used. I apologize for any typographical errors that were not detected and corrected.) Date: 10/13/2023; Time: 2:09 PM

## 2023-10-13 ENCOUNTER — Ambulatory Visit: Attending: Pain Medicine | Admitting: Pain Medicine

## 2023-10-13 ENCOUNTER — Encounter: Payer: Self-pay | Admitting: Pain Medicine

## 2023-10-13 VITALS — BP 124/63 | HR 75 | Temp 98.1°F | Resp 16 | Ht 64.0 in | Wt 284.0 lb

## 2023-10-13 DIAGNOSIS — M1611 Unilateral primary osteoarthritis, right hip: Secondary | ICD-10-CM | POA: Insufficient documentation

## 2023-10-13 DIAGNOSIS — M25561 Pain in right knee: Secondary | ICD-10-CM | POA: Insufficient documentation

## 2023-10-13 DIAGNOSIS — R768 Other specified abnormal immunological findings in serum: Secondary | ICD-10-CM | POA: Diagnosis not present

## 2023-10-13 DIAGNOSIS — M25551 Pain in right hip: Secondary | ICD-10-CM | POA: Insufficient documentation

## 2023-10-13 DIAGNOSIS — F419 Anxiety disorder, unspecified: Secondary | ICD-10-CM | POA: Insufficient documentation

## 2023-10-13 DIAGNOSIS — M79604 Pain in right leg: Secondary | ICD-10-CM | POA: Diagnosis not present

## 2023-10-13 DIAGNOSIS — E790 Hyperuricemia without signs of inflammatory arthritis and tophaceous disease: Secondary | ICD-10-CM | POA: Insufficient documentation

## 2023-10-13 DIAGNOSIS — R7309 Other abnormal glucose: Secondary | ICD-10-CM | POA: Insufficient documentation

## 2023-10-13 DIAGNOSIS — M47816 Spondylosis without myelopathy or radiculopathy, lumbar region: Secondary | ICD-10-CM | POA: Insufficient documentation

## 2023-10-13 DIAGNOSIS — R700 Elevated erythrocyte sedimentation rate: Secondary | ICD-10-CM | POA: Insufficient documentation

## 2023-10-13 DIAGNOSIS — G8929 Other chronic pain: Secondary | ICD-10-CM | POA: Diagnosis not present

## 2023-10-13 DIAGNOSIS — M545 Low back pain, unspecified: Secondary | ICD-10-CM | POA: Diagnosis not present

## 2023-10-13 DIAGNOSIS — G894 Chronic pain syndrome: Secondary | ICD-10-CM | POA: Diagnosis not present

## 2023-10-13 DIAGNOSIS — M87051 Idiopathic aseptic necrosis of right femur: Secondary | ICD-10-CM | POA: Insufficient documentation

## 2023-10-13 DIAGNOSIS — M5459 Other low back pain: Secondary | ICD-10-CM | POA: Diagnosis not present

## 2023-10-13 DIAGNOSIS — M1711 Unilateral primary osteoarthritis, right knee: Secondary | ICD-10-CM | POA: Insufficient documentation

## 2023-10-13 DIAGNOSIS — F4541 Pain disorder exclusively related to psychological factors: Secondary | ICD-10-CM | POA: Insufficient documentation

## 2023-10-13 MED ORDER — DIAZEPAM 5 MG PO TABS: 5.0000 mg | ORAL_TABLET | ORAL | 0 refills | Status: DC

## 2023-10-13 NOTE — Patient Instructions (Signed)
 ______________________________________________________________________    Preparing for your procedure  Appointments: If you think you may not be able to keep your appointment, call 24-48 hours in advance to cancel. We need time to make it available to others.  Procedure visits are for procedures only. During your procedure appointment there will be: NO Prescription Refills*. NO medication changes or discussions*. NO discussion of disability issues*. NO unrelated pain problem evaluations*. NO evaluations to order other pain procedures*. *These will be addressed at a separate and distinct evaluation encounter on the provider's evaluation schedule and not during procedure days.  Instructions: Food intake: Avoid eating anything solid for at least 8 hours prior to your procedure. Clear liquid intake: You may take clear liquids such as water up to 2 hours prior to your procedure. (No carbonated drinks. No soda.) Transportation: Unless otherwise stated by your physician, bring a driver. (Driver cannot be a Market researcher, Pharmacist, community, or any other form of public transportation.) Morning Medicines: Except for blood thinners, take all of your other morning medications with a sip of water. Make sure to take your heart and blood pressure medicines. If your blood pressure's lower number is above 100, the case will be rescheduled. Blood thinners: Make sure to stop your blood thinners as instructed.  If you take a blood thinner, but were not instructed to stop it, call our office [phone] and ask to talk to a nurse. Not stopping a blood thinner prior to certain procedures could lead to serious complications. Diabetics on insulin: Notify the staff so that you can be scheduled 1st case in the morning. If your diabetes requires high dose insulin, take only  of your normal insulin dose the morning of the procedure and notify the staff that you have done so. Preventing infections: Shower with an antibacterial soap the  morning of your procedure.  Build-up your immune system: Take 1000 mg of Vitamin C with every meal (3 times a day) the day prior to your procedure. Antibiotics: Inform the nursing staff if you are taking any antibiotics or if you have any conditions that may require antibiotics prior to procedures. (Example: recent joint implants)   Pregnancy: If you are pregnant make sure to notify the nursing staff. Not doing so may result in injury to the fetus, including death.  Sickness: If you have a cold, fever, or any active infections, call and cancel or reschedule your procedure. Receiving steroids while having an infection may result in complications. Arrival: You must be in the facility at least 30 minutes prior to your scheduled procedure. Tardiness: Your scheduled time is also the cutoff time. If you do not arrive at least 15 minutes prior to your procedure, you will be rescheduled.  Children: Do not bring any children with you. Make arrangements to keep them home. Dress appropriately: There is always a possibility that your clothing may get soiled. Avoid long dresses. Valuables: Do not bring any jewelry or valuables.  Reasons to call and reschedule or cancel your procedure: (Following these recommendations will minimize the risk of a serious complication.) Surgeries: Avoid having procedures within 2 weeks of any surgery. (Avoid for 2 weeks before or after any surgery). Flu Shots: Avoid having procedures within 2 weeks of a flu shots or . (Avoid for 2 weeks before or after immunizations). Barium: Avoid having a procedure within 7-10 days after having had a radiological study involving the use of radiological contrast. (Myelograms, Barium swallow or enema study). Heart attacks: Avoid any elective procedures or surgeries for the  initial 6 months after a "Myocardial Infarction" (Heart Attack). Blood thinners: It is imperative that you stop these medications before procedures. Let us know if you if you take  any blood thinner.  Infection: Avoid procedures during or within two weeks of an infection (including chest colds or gastrointestinal problems). Symptoms associated with infections include: Localized redness, fever, chills, night sweats or profuse sweating, burning sensation when voiding, cough, congestion, stuffiness, runny nose, sore throat, diarrhea, nausea, vomiting, cold or Flu symptoms, recent or current infections. It is specially important if the infection is over the area that we intend to treat. Heart and lung problems: Symptoms that may suggest an active cardiopulmonary problem include: cough, chest pain, breathing difficulties or shortness of breath, dizziness, ankle swelling, uncontrolled high or unusually low blood pressure, and/or palpitations. If you are experiencing any of these symptoms, cancel your procedure and contact your primary care physician for an evaluation.  Remember:  Regular Business hours are:  Monday to Thursday 8:00 AM to 4:00 PM  Provider's Schedule: Delano Metz, MD:  Procedure days: Tuesday and Thursday 7:30 AM to 4:00 PM  Edward Jolly, MD:  Procedure days: Monday and Wednesday 7:30 AM to 4:00 PM Last  Updated: 05/27/2023 ______________________________________________________________________    Facet Joint Block The facet joints connect the bones of the spine (vertebrae). They let you bend, twist, and make other movements with your spine. They also keep you from bending too far, twisting too far, and making other extreme movements. A facet joint block is a procedure where a numbing medicine (local anesthesia) is injected into a facet joint. Many times, a medicine for inflammation (steroid) is also injected. A facet joint block may be done: To diagnose neck or back pain. If the pain gets better after a facet joint block, the pain is likely coming from the facet joint. If the pain does not get better, the pain is likely not coming from the facet joint. To  treat neck or back pain caused by an inflamed facet joint. To help you with physical therapy or other rehab (rehabilitation) exercises. Tell a health care provider about: Any allergies you have. All medicines you are taking, including vitamins, herbs, eye drops, creams, and over-the-counter medicines. Any problems you or family members have had with anesthesia. Any bleeding problems you have. Any surgeries you have had. Any medical conditions you have or have had. Whether you are pregnant or may be pregnant. What are the risks? Your health care provider will talk with you about risks. These may include: Infection. Allergic reactions to medicines or dyes. Bleeding. Injury to a nerve near where the needle was put in (injection site). Pain at the injection site. Short-term weakness or numbness in areas near the nerves at the injection site. What happens before the procedure? When to stop eating and drinking Follow instructions from your health care provider about what you may eat and drink. Medicines Ask your health care provider about: Changing or stopping your regular medicines. These include any diabetes medicines or blood thinners you take. Taking medicines such as aspirin and ibuprofen. These medicines can thin your blood. Do not take these medicines unless your health care provider tells you to. Taking over-the-counter medicines, vitamins, herbs, and supplements. General instructions If you will be going home right after the procedure, plan to have a responsible adult: Take you home from the hospital or clinic. You will not be allowed to drive. Care for you for the time you are told. Ask your health care provider:  How your injection site will be marked. What steps will be taken to help prevent infection. These may include washing skin with a soap that kills germs. What happens during the procedure?  An IV will be inserted into one of your veins. You will lie on your stomach on  an X-ray table. You may be asked to lie in a different position if you will be getting an injection in your neck. Your injection site will be cleaned with a soap that kills germs and then covered with a germ-free (sterile) drape. A local anesthesia will be put in at the injection site. A type of X-ray machine (fluoroscopy) or CT scan will be used to help find your facet joint. A contrast dye may also be injected into your joint to help show if the needle is at the joint. When your provider knows the needle is at your joint, they will inject anesthesia and anti-inflammatory medicine as needed. The needle will be removed. Pressure will be applied to keep your injection site from bleeding. A bandage (dressing) will be placed over each injection site. The procedure may vary among health care providers and hospitals. What happens after the procedure? Your blood pressure, heart rate, breathing rate, and blood oxygen level will be monitored until you leave the hospital or clinic. This information is not intended to replace advice given to you by your health care provider. Make sure you discuss any questions you have with your health care provider. Document Revised: 12/14/2021 Document Reviewed: 12/14/2021 Elsevier Patient Education  2024 [employer].

## 2023-10-13 NOTE — Progress Notes (Signed)
 Safety precautions to be maintained throughout the outpatient stay will include: orient to surroundings, keep bed in low position, maintain call bell within reach at all times, provide assistance with transfer out of bed and ambulation.

## 2023-10-16 ENCOUNTER — Other Ambulatory Visit: Payer: Self-pay

## 2023-10-16 NOTE — Patient Instructions (Addendum)
 Thank you for allowing the Complex Care Management team to participate in your care. It was great speaking with you.  Reminders: Please continue to monitor your blood pressure and blood sugars. Write the readings in a notebook or in your calendar to identify trends. Please continue following the recommended safety and fall prevention measures. Continue to utilize leg wraps to manage your lymphedema as advised.  Our next outreach will be on December 05, 2023 via telephone.  Please do not hesitate to contact me if you require assistance prior to our next outreach.    Roxie Cord [facility] Population Health RN Care Manager Direct Dial: [phone]  Fax: [phone] Website: [email or web address]

## 2023-10-16 NOTE — Patient Outreach (Signed)
 Complex Care Management   Visit Note  10/16/2023  Name:  Michelle Marks MRN: [id number] DOB: 21-Apr-1951  Situation: Referral received for Complex Care Management related to  HTN, HLD, DM and Lymphedema.  I obtained verbal consent from Patient.  Visit completed with Michelle Marks via telephone today.  Background:   Past Medical History:  Diagnosis Date   Anxiety    Arthritis    Difficult intubation    Edema, leg    Endometrial polyp    s/p Hysteroscopy D&C with polypectomy in 11/2016   GERD (gastroesophageal reflux disease)    Hyperlipidemia    Morbid obesity (HCC) 10/24/2016     Assessment: Patient Reported Symptoms: Cognitive Cognitive Status: Alert and oriented to person, place, and time, Normal speech and language skills Cognitive/Intellectual Conditions Management [RPT]: None reported or documented in medical history or problem list   Health Maintenance Behaviors: Annual physical exam, Stress management Healing Pattern: Slow Health Facilitated by: Stress management  Neurological Neurological Review of Symptoms: Weakness (Reports weakness in lower extremites which has been a chronic issue) Neurological Management Strategies: Adequate rest, Routine screening Neurological Self-Management Outcome: 3 (uncertain) Neurological Comment: Reports chronic weakness in lower extremities and inability to stand for prolonged periods. She has tried gabapentin  and lyrica  in the past. Weakness and limited mobilty has been complicated by lymphedema and high BMI  HEENT HEENT Symptoms Reported: No symptoms reported HEENT Conditions: Vision problem(s) Vision Problems: cataract(s) (Currently being followed by Dr. Alto Marks.) HEENT Management Strategies: Medical device, Routine screening, Coping strategies HEENT Self-Management Outcome: 4 (good) Vision problem(s)  Cardiovascular Cardiovascular Symptoms Reported: Swelling in legs or feet (Reports chronic swelling due to lymphedema) Does patient have  uncontrolled Hypertension?: No Cardiovascular Conditions: High blood cholesterol, Hypertension Cardiovascular Management Strategies: Medical device, Medication therapy, Routine screening, Diet modification Cardiovascular Self-Management Outcome: 4 (good)  Respiratory Respiratory Symptoms Reported: No symptoms reported Respiratory Comment: No Symptoms Reported  Endocrine Patient reports the following symptoms related to hypoglycemia or hyperglycemia : No symptoms reported Is patient diabetic?: Yes Is patient checking blood sugars at home?: Yes Endocrine Conditions: Diabetes, Vitamin D  deficiency, Thyroid  disorder Endocrine Management Strategies: Medical device, Medication therapy, Routine screening Endocrine Self-Management Outcome: 4 (good) Endocrine Comment: Reports managing well and taking medications as prescribed however recent fasting sugars have been elevated in the 140s. Will continue to monitor nutritional intake monitor closely to identify trends.  Gastrointestinal Gastrointestinal Symptoms Reported: No symptoms reported, Obesity (No acute symptoms reported) Gastrointestinal Conditions: Reflux/heartburn (Hiatal Hernia) Gastrointestinal Management Strategies: Diet modification, Medication therapy, Coping strategies Gastrointestinal Self-Management Outcome: 4 (good) Nutrition Risk Screen (CP): No indicators present  Genitourinary Genitourinary Symptoms Reported: No symptoms reported Genitourinary Comment: No  Symptoms Reported  Integumentary Integumentary Symptoms Reported: No symptoms reported Skin Conditions: Psoriasis Skin Management Strategies: Medication therapy Skin Self-Management Outcome: 4 (good)  Musculoskeletal Musculoskelatal Symptoms Reviewed: Difficulty walking, Unsteady gait Additional Musculoskeletal Details: Reports limited mobility due to chronic lymphedema Musculoskeletal Conditions: Back pain, Joint pain, Mobility limited, Osteoarthritis Musculoskeletal  Management Strategies: Medication therapy, Routine screening, Weight management (Currently working with the Vascular Surgery team and pending approval for compression pumps.) Musculoskeletal Self-Management Outcome: 3 (uncertain) Falls in the past year?: No Number of falls in past year: 1 or less Was there an injury with Fall?: No Fall Risk Category Calculator: 0 Patient Fall Risk Level: Low Fall Risk Patient at Risk for Falls Due to: Impaired mobility, Medication side effect Fall risk Follow up: Falls prevention discussed  Psychosocial Psychosocial Symptoms Reported: No symptoms reported Behavioral  Health Conditions: Anxiety (Reports requiring Valium  prior to imaging due to anxiety. Reports able to manage daily symptoms without medication) Behavioral Management Strategies: Coping strategies, Medication therapy, Community resources Behavioral Health Self-Management Outcome: 4 (good) Major Change/Loss/Stressor/Fears (CP): Medical condition, self Techniques to Cope with Loss/Stress/Change: Diversional activities Quality of Family Relationships: supportive Do you feel physically threatened by others?: No      10/16/2023    4:15 PM  Depression screen PHQ 2/9  Decreased Interest 0  Down, Depressed, Hopeless 0  PHQ - 2 Score 0    Vitals:   10/16/23 1630  BP: 123/63    Medications Reviewed Today     Reviewed by Roxie Cord, RN (Registered Nurse) on 10/16/23 at 1537  Med List Status: <None>   Medication Order Taking? Sig Documenting Provider Last Dose Status Informant  Accu-Chek FastClix Lancets MISC [id number]  1 each by Does not apply route daily before breakfast. Cannady, Jolene T, NP  Active   atorvastatin  (LIPITOR) 40 MG tablet [id number]  Take 1 tablet (40 mg total) by mouth daily. Cannady, Jolene T, NP  Active   augmented betamethasone  dipropionate (DIPROLENE -AF) 0.05 % cream [id number]  Apply 1 Application topically 2 (two) times daily as needed (to affected skin areas).  Cannady, Jolene T, NP  Active   Blood Glucose Monitoring Suppl (ACCU-CHEK GUIDE) w/Device KIT [id number]  To check blood sugar twice a day with goal fasting in morning <130 and goal 2 hours after meal <180.  Write all checks down for provider visits. Cannady, Jolene T, NP  Active   diazepam  (VALIUM ) 5 MG tablet [id number]  Take 1 tablet (5 mg total) by mouth 60 (sixty) minutes before procedure for 1 dose. 1 tab PO 60 min pre-MRI. If still anxious, take 2nd tab 15 min just before MRI. Max: 2 tbs (10 mg). Avoid taking opioid pain medications within 4 hours of taking valium . Must have a driver. Do not drive or operate machinery x 24 hours after taking this medication. Naveira, Francisco, MD  Active   ergocalciferol  (VITAMIN D2) 1.25 MG (50000 UT) capsule [id number]  Take 1 capsule (50,000 Units total) by mouth once a week. Cannady, Jolene T, NP  Active   esomeprazole  (NEXIUM ) 40 MG capsule [id number]  TAKE 1 CAPSULE BY MOUTH DAILY Cannady, Jolene T, NP  Active   glucose blood test strip [id number]  To check blood sugar daily prior to eating in morning, with goal less the 130. Cannady, Jolene T, NP  Active   levothyroxine  (SYNTHROID ) 75 MCG tablet [id number]  Take 1 tablet (75 mcg total) by mouth daily before breakfast. Cannady, Jolene T, NP  Active   torsemide  (DEMADEX ) 100 MG tablet [id number]  Take 1 tablet (100 mg total) by mouth daily. Lemar Pyles, NP  Active   Med List Note Huston Maiers, RN 09/22/23 1350): UDS 09/22/23            Recommendation:   Complete medical appointments as scheduled  Follow Up Plan:   Telephone follow up appointment with Nurse Case Manager on December 05, 2023   Roxie Cord [facility] RN Care Manager Direct Dial: [phone]  Fax: [phone] Website: [email or web address]

## 2023-10-28 ENCOUNTER — Telehealth: Payer: Self-pay

## 2023-10-28 ENCOUNTER — Ambulatory Visit: Attending: Pain Medicine | Admitting: Pain Medicine

## 2023-10-28 ENCOUNTER — Ambulatory Visit
Admission: RE | Admit: 2023-10-28 | Discharge: 2023-10-28 | Disposition: A | Discharge status: Home / Self Care | Source: Ambulatory Visit | Attending: Pain Medicine | Admitting: Pain Medicine

## 2023-10-28 ENCOUNTER — Encounter: Payer: Self-pay | Admitting: Pain Medicine

## 2023-10-28 VITALS — BP 99/59 | HR 81 | Temp 97.2°F | Resp 20 | Ht 64.0 in | Wt 284.0 lb

## 2023-10-28 DIAGNOSIS — M545 Low back pain, unspecified: Secondary | ICD-10-CM | POA: Insufficient documentation

## 2023-10-28 DIAGNOSIS — M5459 Other low back pain: Secondary | ICD-10-CM | POA: Diagnosis not present

## 2023-10-28 DIAGNOSIS — F419 Anxiety disorder, unspecified: Secondary | ICD-10-CM | POA: Diagnosis present

## 2023-10-28 DIAGNOSIS — R700 Elevated erythrocyte sedimentation rate: Secondary | ICD-10-CM | POA: Insufficient documentation

## 2023-10-28 DIAGNOSIS — Z6841 Body Mass Index (BMI) 40.0 and over, adult: Secondary | ICD-10-CM | POA: Diagnosis present

## 2023-10-28 DIAGNOSIS — F4541 Pain disorder exclusively related to psychological factors: Secondary | ICD-10-CM | POA: Diagnosis present

## 2023-10-28 DIAGNOSIS — M47816 Spondylosis without myelopathy or radiculopathy, lumbar region: Secondary | ICD-10-CM | POA: Diagnosis not present

## 2023-10-28 MED ORDER — ROPIVACAINE HCL 2 MG/ML IJ SOLN
INTRAMUSCULAR | Status: AC
  Filled 2023-10-28: qty 20

## 2023-10-28 MED ORDER — MIDAZOLAM HCL 2 MG/2ML IJ SOLN
0.5000 mg | Freq: Once | INTRAMUSCULAR | Status: AC
Start: 2023-10-28 — End: 2023-10-28
  Administered 2023-10-28: 2 mg via INTRAVENOUS

## 2023-10-28 MED ORDER — TRIAMCINOLONE ACETONIDE 40 MG/ML IJ SUSP
INTRAMUSCULAR | Status: AC
Start: 2023-10-28 — End: ?
  Filled 2023-10-28: qty 2

## 2023-10-28 MED ORDER — LIDOCAINE HCL 2 % IJ SOLN
20.0000 mL | Freq: Once | INTRAMUSCULAR | Status: AC
  Administered 2023-10-28: 400 mg

## 2023-10-28 MED ORDER — PENTAFLUOROPROP-TETRAFLUOROETH EX AERO
INHALATION_SPRAY | Freq: Once | CUTANEOUS | Status: AC
  Administered 2023-10-28: 30 via TOPICAL

## 2023-10-28 MED ORDER — ROPIVACAINE HCL 2 MG/ML IJ SOLN
18.0000 mL | Freq: Once | INTRAMUSCULAR | Status: AC
  Administered 2023-10-28: 20 mL via PERINEURAL

## 2023-10-28 MED ORDER — TRIAMCINOLONE ACETONIDE 40 MG/ML IJ SUSP
80.0000 mg | Freq: Once | INTRAMUSCULAR | Status: AC
  Administered 2023-10-28: 40 mg

## 2023-10-28 MED ORDER — LIDOCAINE HCL 2 % IJ SOLN
INTRAMUSCULAR | Status: AC
  Filled 2023-10-28: qty 20

## 2023-10-28 MED ORDER — MIDAZOLAM HCL 2 MG/2ML IJ SOLN
INTRAMUSCULAR | Status: AC
  Filled 2023-10-28: qty 2

## 2023-10-28 NOTE — Patient Instructions (Signed)
 ______________________________________________________________________    Post-Procedure Discharge Instructions  Instructions: Apply ice:  Purpose: This will minimize any swelling and discomfort after procedure.  When: Day of procedure, as soon as you get home. How: Fill a plastic sandwich bag with crushed ice. Cover it with a small towel and apply to injection site. How long: (15 min on, 15 min off) Apply for 15 minutes then remove x 15 minutes.  Repeat sequence on day of procedure, until you go to bed. Apply heat:  Purpose: To treat any soreness and discomfort from the procedure. When: Starting the next day after the procedure. How: Apply heat to procedure site starting the day following the procedure. How long: May continue to repeat daily, until discomfort goes away. Food intake: Start with clear liquids (like water) and advance to regular food, as tolerated.  Physical activities: Keep activities to a minimum for the first 8 hours after the procedure. After that, then as tolerated. Driving: If you have received any sedation, be responsible and do not drive. You are not allowed to drive for 24 hours after having sedation. Blood thinner: (Applies only to those taking blood thinners) You may restart your blood thinner 6 hours after your procedure. Insulin: (Applies only to Diabetic patients taking insulin) As soon as you can eat, you may resume your normal dosing schedule. Infection prevention: Keep procedure site clean and dry. Shower daily and clean area with soap and water. Post-procedure Pain Diary: Extremely important that this be done correctly and accurately. Recorded information will be used to determine the next step in treatment. For the purpose of accuracy, follow these rules: Evaluate only the area treated. Do not report or include pain from an untreated area. For the purpose of this evaluation, ignore all other areas of pain, except for the treated area. After your procedure,  avoid taking a long nap and attempting to complete the pain diary after you wake up. Instead, set your alarm clock to go off every hour, on the hour, for the initial 8 hours after the procedure. Document the duration of the numbing medicine, and the relief you are getting from it. Do not go to sleep and attempt to complete it later. It will not be accurate. If you received sedation, it is likely that you were given a medication that may cause amnesia. Because of this, completing the diary at a later time may cause the information to be inaccurate. This information is needed to plan your care. Follow-up appointment: Keep your post-procedure follow-up evaluation appointment after the procedure (usually 2 weeks for most procedures, 6 weeks for radiofrequencies). DO NOT FORGET to bring you pain diary with you.   Expect: (What should I expect to see with my procedure?) From numbing medicine (AKA: Local Anesthetics): Numbness or decrease in pain. You may also experience some weakness, which if present, could last for the duration of the local anesthetic. Onset: Full effect within 15 minutes of injected. Duration: It will depend on the type of local anesthetic used. On the average, 1 to 8 hours.  From steroids (Applies only if steroids were used): Decrease in swelling or inflammation. Once inflammation is improved, relief of the pain will follow. Onset of benefits: Depends on the amount of swelling present. The more swelling, the longer it will take for the benefits to be seen. In some cases, up to 10 days. Duration: Steroids will stay in the system x 2 weeks. Duration of benefits will depend on multiple posibilities including persistent irritating factors. Side-effects: If  present, they may typically last 2 weeks (the duration of the steroids). Frequent: Cramps (if they occur, drink Gatorade and take over-the-counter Magnesium  450-500 mg once to twice a day); water retention with temporary weight gain;  increases in blood sugar; decreased immune system response; increased appetite. Occasional: Facial flushing (red, warm cheeks); mood swings; menstrual changes. Uncommon: Long-term decrease or suppression of natural hormones; bone thinning. (These are more common with higher doses or more frequent use. This is why we prefer that our patients avoid having any injection therapies in other practices.)  Very Rare: Severe mood changes; psychosis; aseptic necrosis. From procedure: Some discomfort is to be expected once the numbing medicine wears off. This should be minimal if ice and heat are applied as instructed.  Call if: (When should I call?) You experience numbness and weakness that gets worse with time, as opposed to wearing off. New onset bowel or bladder incontinence. (Applies only to procedures done in the spine)  Emergency Numbers: Durning business hours (Monday - Thursday, 8:00 AM - 4:00 PM) (Friday, 9:00 AM - 12:00 Noon): [phone] After hours: [phone] NOTE: If you are having a problem and are unable connect with, or to talk to a provider, then go to your nearest urgent care or emergency department. If the problem is serious and urgent, please call 911. ______________________________________________________________________      ______________________________________________________________________    Patient information on: Body mass index (BMI) and Weight Management  Dear Michelle Marks you are receiving this information because your weight may be adversely affecting your health.   Your current Estimated body mass index is 48.75 kg/m as calculated from the following:   Height as of 10/13/23: 5\' 4"  (1.626 m).   Weight as of 10/13/23: 284 lb (128.8 kg).  We recommend you talk to your primary care physician about providing or referring you to a supervised weight management program.  Here is some information about weight and the body mass index (BMI) classification:  BMI is a  measure of obesity that's calculated by dividing a person's weight in kilograms by their height in meters squared. A person can use an online calculator to determine their BMI. Body mass index (BMI) is a common tool for deciding whether a person has an appropriate body weight.  It measures a person's weight in relation to their height.  According to the [facility] of health (NIH): A BMI of less than 18.5 means that a person is underweight. A BMI of between 18.5 and 24.9 is ideal. A BMI of between 25 and 29.9 is overweight. A BMI over 30 indicates obesity.  Body Mass Index (BMI) Classification BMI level (kg/m2) Category Associated incidence of chronic pain  <18  Underweight   18.5-24.9 Ideal body weight   25-29.9 Overweight  20%  30-34.9 Obese (Class I)  68%  35-39.9 Severe obesity (Class II)  136%  >40 Extreme obesity (Class III)  254%    Morbidly Obese Classification: You will be considered to be "Morbidly Obese" if your BMI is above 30 and you have one or more of the following conditions caused or associated to obesity: 1.    Type 2 Diabetes (Leading to cardiovascular diseases (CVD), stroke, peripheral vascular diseases (PVD), retinopathy, nephropathy, and neuropathy) 2.    Cardiovascular Disease (High Blood Pressure; Congestive Heart Failure; High Cholesterol; Coronary Artery Disease; Angina; Arrhythmias, Dysrhythmias, or Heart Attacks) 3.    Breathing problems (Asthma; obesity-hypoventilation syndrome; obstructive sleep apnea; chronic inflammatory airway disease; reactive airway disease; or shortness  of breath) 4.    Chronic kidney disease 5.    Liver disease (nonalcoholic fatty liver disease) 6.    High blood pressure 7.    Acid reflux (gastroesophageal reflux disease; heartburn) 8.    Osteoarthritis (OA) (affecting the hip(s), the knee(s) and/or the lower back) (usually requiring knee and/or hip replacements, as well as back surgeries) 9.    Low back pain (Lumbar Facet  Syndrome; and/or Degenerative Disc Disease) 10.  Hip pain (Osteoarthritis of hip) (For every 1 lbs of added body weight, there is a 2 lbs increase in pressure inside of each hip articulation. 1:2 mechanical relationship) 11.  Knee pain (Osteoarthritis of knee) (For every 1 lbs of added body weight, there is a 4 lbs increase in pressure inside of each knee articulation. 1:4 mechanical relationship) (patients with a BMI>30 kg/m2 were 6.8 times more likely to develop knee OA than normal-weight individuals) 12.  Cancer: Epidemiological studies have shown that obesity is a risk factor for: post-menopausal breast cancer; cancers of the endometrium, colon and kidney cancer; malignant adenomas of the esophagus. Obese subjects have an approximately 1.5-3.5-fold increased risk of developing these cancers compared with normal-weight subjects, and it has been estimated that between 15 and 45% of these cancers can be attributed to overweight. More recent studies suggest that obesity may also increase the risk of other types of cancer, including pancreatic, hepatic and gallbladder cancer. (Ref: Obesity and cancer. Pischon T, Nthlings U, [employer] H. Proc Nutr Soc. 2008 May;67(2):128-45. doi: 10.1017/S0029665108006976.) The International Agency for Research on Cancer (IARC) has identified 13 cancers associated with overweight and obesity: meningioma, multiple myeloma, adenocarcinoma of the esophagus, and cancers of the thyroid , postmenopausal breast cancer, gallbladder, stomach, liver, pancreas, kidney, ovaries, uterus, colon and rectal (colorectal) cancers. 55 percent of all cancers diagnosed in women and 24 percent of those diagnosed in men are associated with overweight and obesity.  Recommendation: If you have any of the above conditions it is urgent that you take a step back and concentrate in losing weight. Dedicate 100% of your efforts on this task. Nothing else will improve your health more than bringing your weight  down and your BMI to less than 30.   Nutritionist and/or supervised weight-management program: We are aware that most chronic pain patients are unable to exercise secondary to their pain. For this reason, you must rely on proper nutrition and diet in order to lose the weight. We recommend you talk to a nutritionist.   Bariatric surgery: A person might be considered a candidate for bariatric surgery if they meet one of the following BMI criteria:  BMI of 40 or higher: This is considered extreme obesity (Class III). BMI of 35-39.9: This is considered obesity, and the person might also have a serious weight-related health condition, such as high blood pressure, type 2 diabetes, or severe sleep apnea  BMI of 30-34.9: This might be considered if the person has serious weight-related health problems and hasn't had substantial weight loss or improvement in co-morbidities through other methods   On your own: A realistic goal is to lose 10% of your body weight over a period of 12 months.  If over a period of six (6) months you have unsuccessfully tried to lose weight, then it is time for you to seek professional help and to enter a medically supervised weight management program, and/or undergo bariatric surgery.   Pain management considerations and possible limitations:  1.    Pharmacological Problems: Be advised that the use  of opioid analgesics (oxycodone ; hydrocodone; morphine; methadone; codeine; and all of their derivatives) have been associated with decreased metabolism and weight gain.  For this reason, should we see that you are unable to lose weight while taking these medications, it may become necessary for us  to taper down and indefinitely discontinue them.  2.    Technical Problems: The incidence of successful interventional therapies decreases as the patient's BMI increases. It is much more difficult to accomplish a safe and effective interventional therapy on a patient with a BMI above 35. 3.     Radiation Exposure Problems: The x-rays machine, used to accomplish injection therapies, will automatically increase their x-ray output in order to capture an appropriate bone image. This means that radiation exposure increases exponentially with the patient's BMI. (The higher the BMI, the higher the radiation exposure.) Although the level of radiation used at a given time is still safe to the patient, it is not for the physician and/or assisting staff. Unfortunately, radiation exposure is accumulative. Because physicians and the staff have to do procedures and be exposed on a daily basis, this can result in health problems such as cancer and radiation burns. Radiation exposure to the staff is monitored by the radiation batches that they wear. The exposure levels are reported back to the staff on a quarterly basis. Depending on levels of exposure, physicians and staff may be obligated by law to decrease this exposure. This means that they have the right and obligation to refuse providing therapies where they may be overexposed to radiation. For this reason, physicians may decline to offer therapies such as radiofrequency ablation or implants to patients with a BMI above 40. 4.    Current Trends: Be advised that the current trend is to no longer offer certain therapies to patients with a BMI equal to, or above 35, due to increase perioperative risks, increased technical procedural difficulties, and excessive radiation exposure to healthcare personnel.  Last updated: 03/10/2023 ______________________________________________________________________

## 2023-10-28 NOTE — Progress Notes (Signed)
 PROVIDER NOTE: Interpretation of information contained herein should be left to medically-trained personnel. Specific patient instructions are provided elsewhere under "Patient Instructions" section of medical record. This document was created in part using STT-dictation technology, any transcriptional errors that may result from this process are unintentional.  Patient: Michelle Marks Type: Established DOB: March 04, 1951 MRN: [id number] PCP: Lemar Pyles, NP  Service: Procedure DOS: 10/28/2023 Setting: Ambulatory Location: Ambulatory outpatient facility Delivery: Face-to-face Provider: Candi Chafe, MD Specialty: Interventional Pain Management Specialty designation: 09 Location: Outpatient facility Ref. Prov.: Cannady, Jolene T, NP       Interventional Therapy   Type: Lumbar Facet, Medial Branch Block(s) (w/ fluoroscopic mapping) #1  Laterality: Right  Level: L3, L4, L5, and S1 Medial Branch Level(s). Injecting these levels blocks the L4-5 and L5-S1 lumbar facet joints.  Imaging: Fluoroscopic guidance Spinal ([id number]) Anesthesia: Local anesthesia (1-2% Lidocaine ) Anxiolysis: IV Versed  2.0 mg Sedation:                         DOS: 10/28/2023 Performed by: Candi Chafe, MD  Primary Purpose: Diagnostic/Therapeutic Indications: Low back pain severe enough to impact quality of life or function. 1. Lumbar facet joint pain   2. Lumbar facet joint arthropathy   3. Low back pain of over 3 months duration   4. Mechanical low back pain   5. Multifactorial low back pain   6. Pain aggravated by anxiety   7. Morbid obesity with body mass index (BMI) of 45.0 to 49.9 in adult (HCC)   8. Elevated sed rate    NAS-11 Pain score:   Pre-procedure: 10-Worst pain ever/10   Post-procedure: 0-No pain/10     Position / Prep / Materials:  Position: Prone  Prep solution: ChloraPrep (2% chlorhexidine gluconate and 70% isopropyl alcohol) Area Prepped: Posterolateral Lumbosacral Spine  (Wide prep: From the lower border of the scapula down to the end of the tailbone and from flank to flank.)  Materials:  Tray: Block Needle(s):  Type: Spinal  Gauge (G): 22  Length: 5-in Qty: 4     H&P (Pre-op Assessment):  [name] is a 73 y.o. (year old), female patient, seen today for interventional treatment. She  has a past surgical history that includes Dilation and curettage of uterus; Hysteroscopy; Joint replacement (Left); Knee arthroscopy (Left); Tonsillectomy; Breast surgery (Right); Hysteroscopy with D & C (N/A, 12/02/2016); Cervical polypectomy (N/A, 12/02/2016); and Breast cyst excision (Right, age 37). [name] has a current medication list which includes the following prescription(s): accu-chek fastclix lancets, atorvastatin , augmented betamethasone  dipropionate, accu-chek guide, diazepam , ergocalciferol , esomeprazole , glucose blood, levothyroxine , and torsemide . Her primarily concern today is the Back Pain  Initial Vital Signs:  Pulse/HCG Rate: 81  Temp: (!) 97.2 F (36.2 C) Resp: 20 BP: (!) 156/64 SpO2: 98 %  BMI: Estimated body mass index is 48.75 kg/m as calculated from the following:   Height as of this encounter: 5\' 4"  (1.626 m).   Weight as of this encounter: 284 lb (128.8 kg).  Risk Assessment: Allergies: Reviewed. She is allergic to sulfa antibiotics.  Allergy Precautions: None required Coagulopathies: Reviewed. None identified.  Blood-thinner therapy: None at this time Active Infection(s): Reviewed. None identified. [name] is afebrile  Site Confirmation: [name] was asked to confirm the procedure and laterality before marking the site Procedure checklist: Completed Consent: Before the procedure and under the influence of no sedative(s), amnesic(s), or anxiolytics, the patient was informed of the treatment options, risks and  possible complications. To fulfill our ethical and legal obligations, as recommended by the American Medical Association's  Code of Ethics, I have informed the patient of my clinical impression; the nature and purpose of the treatment or procedure; the risks, benefits, and possible complications of the intervention; the alternatives, including doing nothing; the risk(s) and benefit(s) of the alternative treatment(s) or procedure(s); and the risk(s) and benefit(s) of doing nothing. The patient was provided information about the general risks and possible complications associated with the procedure. These may include, but are not limited to: failure to achieve desired goals, infection, bleeding, organ or nerve damage, allergic reactions, paralysis, and death. In addition, the patient was informed of those risks and complications associated to Spine-related procedures, such as failure to decrease pain; infection (i.e.: Meningitis, epidural or intraspinal abscess); bleeding (i.e.: epidural hematoma, subarachnoid hemorrhage, or any other type of intraspinal or peri-dural bleeding); organ or nerve damage (i.e.: Any type of peripheral nerve, nerve root, or spinal cord injury) with subsequent damage to sensory, motor, and/or autonomic systems, resulting in permanent pain, numbness, and/or weakness of one or several areas of the body; allergic reactions; (i.e.: anaphylactic reaction); and/or death. Furthermore, the patient was informed of those risks and complications associated with the medications. These include, but are not limited to: allergic reactions (i.e.: anaphylactic or anaphylactoid reaction(s)); adrenal axis suppression; blood sugar elevation that in diabetics may result in ketoacidosis or comma; water retention that in patients with history of congestive heart failure may result in shortness of breath, pulmonary edema, and decompensation with resultant heart failure; weight gain; swelling or edema; medication-induced neural toxicity; particulate matter embolism and blood vessel occlusion with resultant organ, and/or nervous system  infarction; and/or aseptic necrosis of one or more joints. Finally, the patient was informed that Medicine is not an exact science; therefore, there is also the possibility of unforeseen or unpredictable risks and/or possible complications that may result in a catastrophic outcome. The patient indicated having understood very clearly. We have given the patient no guarantees and we have made no promises. Enough time was given to the patient to ask questions, all of which were answered to the patient's satisfaction. Ms. Lennox has indicated that she wanted to continue with the procedure. Attestation: I, the ordering provider, attest that I have discussed with the patient the benefits, risks, side-effects, alternatives, likelihood of achieving goals, and potential problems during recovery for the procedure that I have provided informed consent. Date  Time: 10/28/2023 12:29 PM  Pre-Procedure Preparation:  Monitoring: As per clinic protocol. Respiration, ETCO2, SpO2, BP, heart rate and rhythm monitor placed and checked for adequate function Safety Precautions: Patient was assessed for positional comfort and pressure points before starting the procedure. Time-out: I initiated and conducted the "Time-out" before starting the procedure, as per protocol. The patient was asked to participate by confirming the accuracy of the "Time Out" information. Verification of the correct person, site, and procedure were performed and confirmed by me, the nursing staff, and the patient. "Time-out" conducted as per Joint Commission's Universal Protocol (UP.01.01.01). Time: 1321 Start Time: 1323 hrs.  Description of Procedure:          Laterality: (see above) Targeted Levels: (see above)  Safety Precautions: Aspiration looking for blood return was conducted prior to all injections. At no point did we inject any substances, as a needle was being advanced. Before injecting, the patient was told to immediately notify me if she  was experiencing any new onset of "ringing in the ears, or  metallic taste in the mouth". No attempts were made at seeking any paresthesias. Safe injection practices and needle disposal techniques used. Medications properly checked for expiration dates. SDV (single dose vial) medications used. After the completion of the procedure, all disposable equipment used was discarded in the proper designated medical waste containers. Local Anesthesia: Protocol guidelines were followed. The patient was positioned over the fluoroscopy table. The area was prepped in the usual manner. The time-out was completed. The target area was identified using fluoroscopy. A 12-in long, straight, sterile hemostat was used with fluoroscopic guidance to locate the targets for each level blocked. Once located, the skin was marked with an approved surgical skin marker. Once all sites were marked, the skin (epidermis, dermis, and hypodermis), as well as deeper tissues (fat, connective tissue and muscle) were infiltrated with a small amount of a short-acting local anesthetic, loaded on a 10cc syringe with a 25G, 1.5-in  Needle. An appropriate amount of time was allowed for local anesthetics to take effect before proceeding to the next step. Local Anesthetic: Lidocaine  2.0% The unused portion of the local anesthetic was discarded in the proper designated containers. Technical description of process:   L3 Medial Branch Nerve Block (MBB): The target area for the L3 medial branch is at the junction of the postero-lateral aspect of the superior articular process and the superior, posterior, and medial edge of the transverse process of L4. Under fluoroscopic guidance, a Quincke needle was inserted until contact was made with os over the superior postero-lateral aspect of the pedicular shadow (target area). After negative aspiration for blood, 0.5 mL of the nerve block solution was injected without difficulty or complication. The needle was removed  intact. L4 Medial Branch Nerve Block (MBB): The target area for the L4 medial branch is at the junction of the postero-lateral aspect of the superior articular process and the superior, posterior, and medial edge of the transverse process of L5. Under fluoroscopic guidance, a Quincke needle was inserted until contact was made with os over the superior postero-lateral aspect of the pedicular shadow (target area). After negative aspiration for blood, 0.5 mL of the nerve block solution was injected without difficulty or complication. The needle was removed intact. L5 Medial Branch Nerve Block (MBB): The target area for the L5 medial branch is at the junction of the postero-lateral aspect of the superior articular process and the superior, posterior, and medial edge of the sacral ala. Under fluoroscopic guidance, a Quincke needle was inserted until contact was made with os over the superior postero-lateral aspect of the pedicular shadow (target area). After negative aspiration for blood, 0.5 mL of the nerve block solution was injected without difficulty or complication. The needle was removed intact. S1 Medial Branch Nerve Block (MBB): The target area for the S1 medial branch is at the posterior and inferior 6 o'clock position of the L5-S1 facet joint. Under fluoroscopic guidance, the Quincke needle inserted for the L5 MBB was redirected until contact was made with os over the inferior and postero aspect of the sacrum, at the 6 o' clock position under the L5-S1 facet joint (Target area). After negative aspiration for blood, 0.5 mL of the nerve block solution was injected without difficulty or complication. The needle was removed intact.  Once the entire procedure was completed, the treated area was cleaned, making sure to leave some of the prepping solution back to take advantage of its long term bactericidal properties.         Illustration of the posterior  view of the lumbar spine and the posterior neural  structures. Laminae of L2 through S1 are labeled. DPRL5, dorsal primary ramus of L5; DPRS1, dorsal primary ramus of S1; DPR3, dorsal primary ramus of L3; FJ, facet (zygapophyseal) joint L3-L4; I, inferior articular process of L4; LB1, lateral branch of dorsal primary ramus of L1; IAB, inferior articular branches from L3 medial branch (supplies L4-L5 facet joint); IBP, intermediate branch plexus; MB3, medial branch of dorsal primary ramus of L3; NR3, third lumbar nerve root; S, superior articular process of L5; SAB, superior articular branches from L4 (supplies L4-5 facet joint also); TP3, transverse process of L3.   Facet Joint Innervation (* possible contribution)  L1-2 T12, L1 (L2*)  Medial Branch  L2-3 L1, L2 (L3*)         "          "  L3-4 L2, L3 (L4*)         "          "  L4-5 L3, L4 (L5*)         "          "  L5-S1 L4, L5, S1          "          "    Vitals:   10/28/23 1229 10/28/23 1324 10/28/23 1334  BP: (!) 156/64 (!) 147/67 (!) 99/59  Pulse: 81 96 81  Resp: 20 (!) 23 20  Temp: (!) 97.2 F (36.2 C)    SpO2: 98% 97% 99%  Weight: 284 lb (128.8 kg)    Height: 5\' 4"  (1.626 m)       End Time: 1328 hrs.  Imaging Guidance (Spinal):          Type of Imaging Technique: Fluoroscopy Guidance (Spinal) Indication(s): Fluoroscopy guidance for needle placement to enhance accuracy in procedures requiring precise needle localization for targeted delivery of medication in or near specific anatomical locations not easily accessible without such real-time imaging assistance. Exposure Time: Please see nurses notes. Contrast: None used. Fluoroscopic Guidance: I was personally present during the use of fluoroscopy. "Tunnel Vision Technique" used to obtain the best possible view of the target area. Parallax error corrected before commencing the procedure. "Direction-depth-direction" technique used to introduce the needle under continuous pulsed fluoroscopy. Once target was reached,  antero-posterior, oblique, and lateral fluoroscopic projection used confirm needle placement in all planes. Images permanently stored in EMR. Interpretation: No contrast injected. I personally interpreted the imaging intraoperatively. Adequate needle placement confirmed in multiple planes. Permanent images saved into the patient's record.  Post-operative Assessment:  Post-procedure Vital Signs:  Pulse/HCG Rate: 81  Temp: (!) 97.2 F (36.2 C) Resp: 20 BP: (!) 99/59 SpO2: 99 %  EBL: None  Complications: No immediate post-treatment complications observed by team, or reported by patient.  Note: The patient tolerated the entire procedure well. A repeat set of vitals were taken after the procedure and the patient was kept under observation following institutional policy, for this type of procedure. Post-procedural neurological assessment was performed, showing return to baseline, prior to discharge. The patient was provided with post-procedure discharge instructions, including a section on how to identify potential problems. Should any problems arise concerning this procedure, the patient was given instructions to immediately contact us , at any time, without hesitation. In any case, we plan to contact the patient by telephone for a follow-up status report regarding this interventional procedure.  Comments:  No additional relevant information.  Plan of Care (  POC)  Orders:  Orders Placed This Encounter  Procedures   LUMBAR FACET(MEDIAL BRANCH NERVE BLOCK) MBNB    Scheduling Instructions:     Procedure: Lumbar facet block (AKA.: Lumbosacral medial branch nerve block)     Side: Bilateral     Level: L3-4, L4-5, L5-S1, and TBD Facets (L2, L3, L4, L5, S1, and TBD Medial Branch Nerves)     Sedation: Patient's choice.     Timeframe: Today    Where will this procedure be performed?:   [facility]   DG [facility] C-ARM 1-60 MIN NO REPORT    Intraoperative interpretation by procedural  physician at [facility].    Standing Status:   Standing    Number of Occurrences:   1    Reason for exam::   Assistance in needle guidance and placement for procedures requiring needle placement in or near specific anatomical locations not easily accessible without such assistance.   Informed Consent Details: Physician/Practitioner Attestation; Transcribe to consent form and obtain patient signature    Nursing Order: Transcribe to consent form and obtain patient signature. Note: Always confirm laterality of pain with Ms. Chyrl Crawford, before procedure.    Physician/Practitioner attestation of informed consent for procedure/surgical case:   I, the physician/practitioner, attest that I have discussed with the patient the benefits, risks, side effects, alternatives, likelihood of achieving goals and potential problems during recovery for the procedure that I have provided informed consent.    Procedure:   Lumbar Facet Block  under fluoroscopic guidance    Physician/Practitioner performing the procedure:   Eli Pattillo A. 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   Provide equipment / supplies at bedside    Procedure tray: "Block Tray" (Disposable  single use) Skin infiltration needle: Regular 1.5-in, 25-G, (x1) Block Needle type: Spinal Amount/quantity: 4 Size: Medium (5-inch) Gauge: 22G    Standing Status:   Standing    Number of Occurrences:   1    Specify:   Block Tray   Saline lock IV    Have LR [phone] mL available and administer at 125 mL/hr if patient becomes hypotensive.    Standing Status:   Standing    Number of Occurrences:   1   Chronic Opioid Analgesic:   None MME/day: 0 mg/day    Medications ordered for procedure: Meds ordered this encounter  Medications   lidocaine  (XYLOCAINE ) 2 % (with pres)  injection 400 mg   pentafluoroprop-tetrafluoroeth (GEBAUERS) aerosol   ropivacaine (PF) 2 mg/mL (0.2%) (NAROPIN) injection 18 mL   triamcinolone acetonide (KENALOG-40) injection 80 mg   midazolam  (VERSED ) injection 0.5-2 mg    Make sure Flumazenil is available in the pyxis when using this medication. If oversedation occurs, administer 0.2 mg IV over 15 sec. If after 45 sec no response, administer 0.2 mg again over 1 min; may repeat at 1 min intervals; not to exceed 4 doses (1 mg)   Medications administered: We administered lidocaine , pentafluoroprop-tetrafluoroeth, ropivacaine (PF) 2 mg/mL (0.2%), triamcinolone acetonide, and midazolam .  See the medical record for exact dosing, route, and time of administration.  Follow-up plan:   No follow-ups on file.       Interventional Therapies  Risk Factors  Considerations  Medical Comorbidities:  MO (BMI>48)  RA  DM     Planned  Pending:   Diagnostic bilateral lumbar  facet MBB #1    Under consideration:   Pending   Completed:   None at this time   Therapeutic  Palliative (PRN) options:   None established   Completed by other providers:   None reported       Recent Visits Date Type Provider Dept  10/13/23 Office Visit Renaldo Caroli, MD [facility]  09/22/23 Office Visit Renaldo Caroli, MD [facility]  Showing recent visits within past 90 days and meeting all other requirements Today's Visits Date Type Provider Dept  10/28/23 Procedure visit Renaldo Caroli, MD [facility]  Showing today's visits and meeting all other requirements Future Appointments Date Type Provider Dept  11/26/23 Appointment Renaldo Caroli, MD [facility]  Showing future appointments within next 90 days and meeting all other requirements  Disposition: Discharge home  Discharge (Date  Time): 10/28/2023;   hrs.   Primary Care Physician: Cannady, Jolene T, NP Location: [facility] Note by: Candi Chafe, MD (TTS technology used. I apologize for any typographical errors that were not detected and corrected.) Date: 10/28/2023; Time: 2:02 PM  Disclaimer:  Medicine is not an Visual merchandiser. The only guarantee in medicine is that nothing is guaranteed. It is important to note that the decision to proceed with this intervention was based on the information collected from the patient. The Data and conclusions were drawn from the patient's questionnaire, the interview, and the physical examination. Because the information was provided in large part by the patient, it cannot be guaranteed that it has not been purposely or unconsciously manipulated. Every effort has been made to obtain as much relevant data as possible for this evaluation. It is important to note that the conclusions that lead to this procedure are derived in large part from the available data. Always take into account that the treatment will also be dependent on availability of resources and existing treatment guidelines, considered by other Pain Management Practitioners as being common knowledge and practice, at the time of the intervention. For Medico-Legal purposes, it is also important to point out that variation in procedural techniques and pharmacological choices are the acceptable norm. The indications, contraindications, technique, and results of the above procedure should only be interpreted and judged by a Board-Certified Interventional Pain Specialist with extensive familiarity and expertise in the same exact procedure and technique.

## 2023-10-28 NOTE — Progress Notes (Signed)
 Safety precautions to be maintained throughout the outpatient stay will include: orient to surroundings, keep bed in low position, maintain call bell within reach at all times, provide assistance with transfer out of bed and ambulation.

## 2023-10-29 ENCOUNTER — Telehealth: Payer: Self-pay | Admitting: *Deleted

## 2023-10-29 NOTE — Telephone Encounter (Signed)
 Attempted to call for post procedure follow-up. Message left.

## 2023-11-04 ENCOUNTER — Ambulatory Visit
Admission: RE | Admit: 2023-11-04 | Discharge: 2023-11-04 | Disposition: A | Discharge status: Home / Self Care | Source: Ambulatory Visit | Attending: Pain Medicine | Admitting: Pain Medicine

## 2023-11-04 ENCOUNTER — Encounter (INDEPENDENT_AMBULATORY_CARE_PROVIDER_SITE_OTHER): Payer: Self-pay

## 2023-11-04 DIAGNOSIS — M1711 Unilateral primary osteoarthritis, right knee: Secondary | ICD-10-CM

## 2023-11-04 DIAGNOSIS — M25551 Pain in right hip: Secondary | ICD-10-CM | POA: Insufficient documentation

## 2023-11-04 DIAGNOSIS — M25561 Pain in right knee: Secondary | ICD-10-CM | POA: Insufficient documentation

## 2023-11-04 DIAGNOSIS — M87051 Idiopathic aseptic necrosis of right femur: Secondary | ICD-10-CM | POA: Diagnosis not present

## 2023-11-04 DIAGNOSIS — S83271A Complex tear of lateral meniscus, current injury, right knee, initial encounter: Secondary | ICD-10-CM | POA: Diagnosis not present

## 2023-11-04 DIAGNOSIS — R601 Generalized edema: Secondary | ICD-10-CM | POA: Diagnosis not present

## 2023-11-04 DIAGNOSIS — G8929 Other chronic pain: Secondary | ICD-10-CM | POA: Insufficient documentation

## 2023-11-04 DIAGNOSIS — M1611 Unilateral primary osteoarthritis, right hip: Secondary | ICD-10-CM | POA: Diagnosis not present

## 2023-11-04 DIAGNOSIS — R700 Elevated erythrocyte sedimentation rate: Secondary | ICD-10-CM

## 2023-11-04 DIAGNOSIS — S83231A Complex tear of medial meniscus, current injury, right knee, initial encounter: Secondary | ICD-10-CM | POA: Diagnosis not present

## 2023-11-04 DIAGNOSIS — M25451 Effusion, right hip: Secondary | ICD-10-CM | POA: Diagnosis not present

## 2023-11-25 NOTE — Progress Notes (Unsigned)
 PROVIDER NOTE: Interpretation of information contained herein should be left to medically-trained personnel. Specific patient instructions are provided elsewhere under Patient Instructions section of medical record. This document was created in part using AI and STT-dictation technology, any transcriptional errors that may result from this process are unintentional.  Patient: Michelle Marks  Service: E/M   PCP: Lemar Pyles, NP  DOB: [birth year]  DOS: 11/26/2023  Provider: Candi Chafe, MD  MRN: [id number]  Delivery: Face-to-face  Specialty: Interventional Pain Management  Type: Established Patient  Setting: Ambulatory outpatient facility  Specialty designation: 09  Referring Prov.: Lemar Pyles, NP  Location: Outpatient office facility       History of present illness (HPI) [name], a 73 y.o. year old female, is here today because of her Chronic bilateral low back pain without sciatica [M54.50, G89.29]. Ms. Michelle primary complain today is Back Pain  Pertinent problems: Ms. Marks has Idiopathic chronic gout of right foot without tophus; Rheumatoid factor positive; Chronic pain syndrome; Chronic low back pain (1ry area of Pain) (Bilateral) w/o sciatica; Chronic lower extremity pain (2ry area of Pain) (Right); Chronic knee pain (3ry area of Pain) (Right); Osteoarthritis of hip (Right); Osteoarthritis of knee (Right); Elevated uric acid in blood; Chronic hip pain (Right); Question of aseptic necrosis of bone of hip (Right) (HCC); Pain aggravated by anxiety; Lumbar facet joint pain; Lumbar facet joint arthropathy; Low back pain of over 3 months duration; Mechanical low back pain; and Multifactorial low back pain on their pertinent problem list.  Pain Assessment: Severity of Chronic pain is reported as a 4 /10. Location: Back Lower, Right/Radiates from lower back into the back of ight leg to back of right knee. Onset: More than a month ago. Quality: Aching, Nagging,  Constant, Discomfort. Timing: Intermittent. Modifying factor(s): Sitting up. Vitals:  height is 5' 4 (1.626 m) and weight is 284 lb (128.8 kg). Her temporal temperature is 97.9 F (36.6 C). Her blood pressure is 128/64 and her pulse is 75. Her respiration is 16 and oxygen saturation is 100%.  BMI: Estimated body mass index is 48.75 kg/m as calculated from the following:   Height as of this encounter: 5' 4 (1.626 m).   Weight as of this encounter: 284 lb (128.8 kg).  Last encounter: 10/13/2023. Last procedure: 10/28/2023.  Reason for encounter: post-procedure evaluation and assessment.   Discussed the use of AI scribe software for clinical note transcription with the patient, who gave verbal consent to proceed.  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 She is accompanied by Michelle Marks, who assists in remembering details of the visit.  She experiences persistent back pain primarily when lying down to sleep, which disrupts her sleep. After a recent procedure, she had temporary relief with numbness during sleep, but the pain has gradually returned. Initially, the pain was reduced to an 'eight' on a scale, but she believes it might have been better than that. The pain used to take an hour to subside but now resolves more quickly upon sitting up, though it still wakes her up. No back pain when standing.  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 This condition causes chronic  inflammation and pain, impacting her mobility and quality of life. 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  Her right hip MRI indicates severe degenerative changes with prominent subchondral bone marrow edema and possible avascular necrosis. She experiences pain when bending down, which she initially thought was hip-related but clarified to be more likely related to her back.  She has advanced degenerative facet joint changes from L3 to S1, with pain exacerbated by certain movements.  She struggles with weight management due to limited mobility and a low appetite, despite being at her lowest weight since high school.     Post-Procedure Evaluation   Type: Lumbar Facet, Medial Branch Block(s) (w/ fluoroscopic mapping) #1  Laterality: Right  Level: L3, L4, L5, and S1 Medial Branch Level(s). Injecting these levels blocks the L4-5 and L5-S1 lumbar facet joints.  Imaging: Fluoroscopic guidance Spinal ([id number]) Anesthesia: Local anesthesia (1-2% Lidocaine ) Anxiolysis: IV Versed  2.0 mg Sedation:                         DOS: 10/28/2023 Performed by: Candi Chafe, MD  Primary Purpose: Diagnostic/Therapeutic Indications: Low back pain severe enough to impact quality of life or function. 1. Lumbar facet joint pain   2. Lumbar facet joint arthropathy   3. Low back pain of over 3 months duration   4. Mechanical low back pain   5. Multifactorial low back pain   6. Pain aggravated by anxiety   7. Morbid obesity with body mass index (BMI) of 45.0 to 49.9 in adult (HCC)   8. Elevated sed rate    NAS-11 Pain score:   Pre-procedure: 10-Worst pain ever/10   Post-procedure: 0-No pain/10     Effectiveness:  Initial hour after procedure: 100 %. Subsequent 4-6 hours post-procedure: 100 %. Analgesia past initial 6 hours: 60 %. Ongoing improvement:  Analgesic: The patient indicated having attained 100% relief of the pain for the duration of local anesthetic followed by an ongoing 60% improvement. Function: Michelle Marks reports improvement in function ROM: Ms.  Marks reports improvement in ROM   Pharmacotherapy Assessment   Analgesic: None MME/day: 0 mg/day   Monitoring: [place] PMP: PDMP reviewed during this encounter.       Pharmacotherapy: No side-effects or adverse reactions reported. Compliance: No problems identified. Effectiveness: Clinically acceptable.  Huston Maiers, RN  11/26/2023 12:43 PM  Sign when Signing Visit Safety precautions to be maintained throughout the outpatient stay will include: orient to surroundings, keep bed in low position, maintain call bell within reach at all times, provide assistance with transfer out of bed and ambulation.     UDS:  Summary  Date Value Ref Range Status  09/22/2023 FINAL  Final    Comment:    ==================================================================== Compliance Drug Analysis, Ur ==================================================================== Test                             Result       Flag       Units    NO DRUGS DETECTED. ==================================================================== Test                      Result    Flag   Units      Ref Range   Creatinine              60  mg/dL      >=62 ==================================================================== Declared Medications:  Medication list was not provided. ==================================================================== For clinical consultation, please call [phone]. ====================================================================     No results found for: CBDTHCR No results found for: D8THCCBX No results found for: D9THCCBX  ROS  Constitutional: Denies any fever or chills Gastrointestinal: No reported hemesis, hematochezia, vomiting, or acute GI distress Musculoskeletal: Denies any acute onset joint swelling, redness, loss of ROM, or weakness Neurological: No reported episodes of acute onset apraxia, aphasia, dysarthria, agnosia, amnesia, paralysis, loss of  coordination, or loss of consciousness  Medication Review  Accu-Chek FastClix Lancets, Accu-Chek Guide, atorvastatin , augmented betamethasone  dipropionate, diazepam , ergocalciferol , esomeprazole , glucose blood, levothyroxine , and torsemide   History Review  Allergy: Ms. Piercey is allergic to sulfa antibiotics. Drug: Ms. Aldaz  reports no history of drug use. Alcohol:  reports current alcohol use. Tobacco:  reports that she quit smoking about 20 years ago. Her smoking use included cigarettes. She started smoking about 52 years ago. She has a 16.1 pack-year smoking history. She has never used smokeless tobacco. Social: Ms. Italiano  reports that she quit smoking about 20 years ago. Her smoking use included cigarettes. She started smoking about 52 years ago. She has a 16.1 pack-year smoking history. She has never used smokeless tobacco. She reports current alcohol use. She reports that she does not use drugs. Medical:  has a past medical history of Anxiety, Arthritis, Difficult intubation, Edema, leg, Endometrial polyp, GERD (gastroesophageal reflux disease), Hyperlipidemia, and Morbid obesity (HCC) (10/24/2016). Surgical: Ms. Hauth  has a past surgical history that includes Dilation and curettage of uterus; Hysteroscopy; Joint replacement (Left); Knee arthroscopy (Left); Tonsillectomy; Breast surgery (Right); Hysteroscopy with D & C (N/A, 12/02/2016); Cervical polypectomy (N/A, 12/02/2016); and Breast cyst excision (Right, age 61). Family: family history includes Cancer in her mother and sister; Diabetes in her father and mother; Hypertension in her mother; Pancreatitis in her father.  Laboratory Chemistry Profile   Renal Lab Results  Component Value Date   BUN CANCELED 09/22/2023   BUN 19 09/22/2023   CREATININE CANCELED 09/22/2023   CREATININE 0.64 09/22/2023   BCR 30 (H) 09/22/2023    Hepatic Lab Results  Component Value Date   AST CANCELED 09/22/2023   AST 16 09/22/2023   ALT 13  07/30/2023   ALBUMIN CANCELED 09/22/2023   ALBUMIN 4.3 09/22/2023   ALKPHOS CANCELED 09/22/2023   ALKPHOS 167 (H) 09/22/2023    Electrolytes Lab Results  Component Value Date   NA CANCELED 09/22/2023   NA 145 (H) 09/22/2023   K CANCELED 09/22/2023   K 3.7 09/22/2023   CL CANCELED 09/22/2023   CL 98 09/22/2023   CALCIUM  CANCELED 09/22/2023   CALCIUM  9.9 09/22/2023   MG CANCELED 09/22/2023   MG 2.2 09/22/2023    Bone Lab Results  Component Value Date   VD25OH 30.5 11/20/2022   25OHVITD1 CANCELED 09/22/2023   25OHVITD1 30 09/22/2023   25OHVITD2 19 09/22/2023   25OHVITD3 11 09/22/2023    Inflammation (CRP: Acute Phase) (ESR: Chronic Phase) Lab Results  Component Value Date   CRP CANCELED 09/22/2023   CRP 4 09/22/2023   ESRSEDRATE CANCELED 09/22/2023   ESRSEDRATE 54 (H) 09/22/2023         Note: Above Lab results reviewed.  Recent Imaging Review  MR KNEE RIGHT WO CONTRAST CLINICAL DATA:  Chronic knee pain  EXAM: MRI OF THE RIGHT KNEE WITHOUT CONTRAST  TECHNIQUE: Multiplanar, multisequence MR imaging of the knee was performed. No intravenous contrast was  administered.  COMPARISON:  X-ray 09/22/2023  FINDINGS: MENISCI  Medial meniscus: Complex degenerative tearing of the medial meniscal body and posterior horn.  Lateral meniscus: Complex tearing/maceration of the lateral meniscal body and anterior horn.  LIGAMENTS  Cruciates: Attenuated appearance of the ACL. Intact PCL with mucoid degeneration.  Collaterals: Intact MCL. Lateral collateral ligament complex intact.  CARTILAGE  Patellofemoral: High-grade cartilage loss of the lateral patellar facet and lateral trochlea.  Medial: Severe medial compartment osteoarthritis with extensive full-thickness cartilage loss.  Lateral: Severe lateral compartment osteoarthritis with extensive full-thickness cartilage loss.  MISCELLANEOUS  Joint: Small complex knee joint effusion containing several  small intra-articular loose bodies. Fat pads within normal limits.  Popliteal Fossa:  No Baker's cyst. Intact popliteus tendon.  Extensor Mechanism:  Intact quadriceps and patellar tendons.  Bones: No acute fracture. No dislocation. Tricompartmental joint space narrowing with marginal osteophyte formation. No bone marrow edema. No marrow replacing bone lesion.  Other: Generalized subcutaneous edema about the knee.  IMPRESSION: 1. Severe tricompartmental osteoarthritis of the right knee. 2. Complex degenerative tearing of the medial and lateral menisci. 3. Small complex knee joint effusion containing several small intra-articular loose bodies.  Electronically Signed   By: Leverne Reading D.O.   On: 11/25/2023 10:18 MR HIP RIGHT WO CONTRAST CLINICAL DATA:  Chronic right hip pain  EXAM: MR OF THE RIGHT HIP WITHOUT CONTRAST  TECHNIQUE: Multiplanar, multisequence MR imaging was performed. No intravenous contrast was administered.  COMPARISON:  X-ray 09/22/2023  FINDINGS: Bones: No acute fracture. No dislocation. Severe degenerative changes of the right hip with prominent subchondral bone marrow edema along both sides of the joint. Bone marrow edema extends through the right femoral neck. There is some flattening of the right femoral head contour. Underlying avascular necrosis would be difficult to exclude. Mild degenerative changes of the contralateral left hip. No left femoral head avascular necrosis. Bony pelvis intact without diastasis. SI joints and pubic symphysis within normal limits. Circumscribed rounded 2.2 cm T1 hypointense lesion in the intertrochanteric aspect of the proximal right femur. No additional bone lesions.  Articular cartilage and labrum  Articular cartilage: Complete cartilage loss along both sides of the right hip joint with prominent subchondral marrow edema.  Labrum:  Diffusely degenerated and torn.  Joint or bursal effusion  Joint  effusion:  Small-moderate complex right hip joint effusion.  Bursae: No abnormal bursal fluid collection.  Muscles and tendons  Muscles and tendons: The gluteal, hamstring, iliopsoas, rectus femoris, and adductor tendons appear intact without tear or significant tendinosis. Normal muscle bulk and signal intensity without edema, atrophy, or fatty infiltration.  Other findings  Miscellaneous: Abnormally thickened and heterogeneous endometrial stripe measuring approximately 15 mm. Colonic diverticulosis. No soft tissue edema or fluid collection. No inguinal lymphadenopathy.  IMPRESSION: 1. Severe degenerative changes of the right hip with prominent subchondral bone marrow edema along both sides of the joint. There is some flattening of the right femoral head contour. Underlying avascular necrosis would be difficult to exclude. 2. Small-moderate complex right hip joint effusion, likely reactive. 3. Circumscribed rounded 2.2 cm lesion in the intertrochanteric aspect of the proximal right femur. This is favored to represent a benign fibro-osseous lesion such as a liposclerosing myxofibrous tumor. Correlation with prior cross-sectional imaging of the pelvis or hips would be helpful to assess the stability of this finding. If no prior imaging is available, a follow-up MRI in 6 months could be considered to ensure stability. 4. Abnormally thickened and heterogeneous endometrial stripe measuring approximately 15 mm.  Recommend further evaluation with pelvic ultrasound.  Electronically Signed   By: Leverne Reading D.O.   On: 11/25/2023 10:12 Note: Reviewed         Physical Exam  General appearance: Well nourished, well developed, and well hydrated. In no apparent acute distress Mental status: Alert, oriented x 3 (person, place, & time)       Respiratory: No evidence of acute respiratory distress Eyes: PERLA Vitals: BP 128/64 (Patient Position: Sitting, Cuff Size: Large)   Pulse 75    Temp 97.9 F (36.6 C) (Temporal)   Resp 16   Ht 5' 4 (1.626 m)   Wt 284 lb (128.8 kg)   SpO2 100%   BMI 48.75 kg/m  BMI: Estimated body mass index is 48.75 kg/m as calculated from the following:   Height as of this encounter: 5' 4 (1.626 m).   Weight as of this encounter: 284 lb (128.8 kg). Ideal: Ideal body weight: 54.7 kg (120 lb 9.5 oz) Adjusted ideal body weight: 84.3 kg (185 lb 15.3 oz)  Assessment   Diagnosis Status  1. Chronic low back pain (1ry area of Pain) (Bilateral) w/o sciatica   2. Lumbar facet joint pain   3. Low back pain of over 3 months duration   4. Mechanical low back pain   5. Multifactorial low back pain   6. Lumbar facet joint arthropathy   7. Postop check   8. Morbid obesity with body mass index (BMI) of 45.0 to 49.9 in adult (HCC)   9. Type 2 diabetes mellitus with obesity (HCC)    Controlled Controlled Controlled   Updated Problems: No problems updated.  Plan of Care  Problem-specific:  Assessment and Plan    Severe tricompartmental osteoarthritis of right knee   MRI confirms severe tricompartmental osteoarthritis with complex degenerative tearing of the medial and lateral meniscus, small complex knee joint effusion, and several small intraarticular loose bodies, indicating chronic inflammation. All three knee compartments are severely involved. She is reluctant to undergo knee replacement due to a past negative experience with left knee surgery. Discuss genicular nerve blocks and radiofrequency ablation for pain management, noting that radiofrequency ablation provides temporary relief (3-18 months) but does not address mechanical issues. Refer to an orthopedic surgeon for further evaluation and discussion of surgical options.  Severe degenerative changes of right hip   MRI shows severe degenerative changes with prominent subchondral bone marrow edema, flattening of the right femoral head contours, and possible avascular necrosis. A small  complex right hip joint effusion suggests ongoing irritation. A circumscribed rounded lesion in the proximal right femur is likely benign. She is considering hip replacement but has concerns about surgery. Refer to an orthopedic surgeon for evaluation of the hip condition and discussion of surgical options. Discuss hip injections and radiofrequency ablation for pain management, considering the potential for hip pain to refer down the leg.  Advanced degenerative facet joint changes in lumbar spine   X-rays show advanced degenerative facet joint changes from L3 to S1. She reports improvement with a previous injection but not complete relief. An MRI is needed to assess soft tissue involvement and nerve impact. Discuss repeating a diagnostic injection to confirm the pain source and the potential for radiofrequency ablation for longer relief, noting effectiveness is higher if BMI is less than 35. Order an MRI of the lumbar spine to assess soft tissue and nerve involvement. Place an order for a repeat diagnostic injection in the lumbar spine as a PM order, to be activated  upon her request.       Ms. MANUELA HALBUR has a current medication list which includes the following long-term medication(s): atorvastatin , esomeprazole , levothyroxine , and torsemide .  Pharmacotherapy (Medications Ordered): No orders of the defined types were placed in this encounter.  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 2.Pain present for longer than 3 months (Chronic) documented to have failed noninvasive conservative therapies. 3.Absence of untreated radiculopathy. 4.There is no radiological evidence of untreated  fractures, tumor, infection, or deformity.  Physical Examination Findings: Positive Kemp Maneuver: (Y)  Positive Lumbar Hyperextension-Rotation provocative test: (Y)    Standing Status:   Standing    Number of Occurrences:   1    Expiration Date:   02/26/2024    Scheduling Instructions:     Procedure: Lumbar facet Block     Type: Medial Branch Block     Side: Right-sided     Purpose: Diagnostic/Therapeutic     Level(s): L3-4, L4-5, and L5-S1 Facets (L2, L3, L4, L5, and S1 Medial Branch)     Sedation: With Sedation.     Timeframe: As soon as schedule allows.    Where will this procedure be performed?:   [facility]   Nursing Instructions:    Please complete this patient's postprocedure evaluation.    Scheduling Instructions:     Please complete this patient's postprocedure evaluation.     Interventional Therapies  Risk Factors  Considerations  Medical Comorbidities:  MO (BMI>48)  RA  DM        Planned  Pending:   Diagnostic bilateral lumbar facet MBB #2    Under consideration:   Pending   Completed:   Diagnostic bilateral lumbar facet MBB x1 (10/28/2023) (100/100/60/60)    Therapeutic  Palliative (PRN) options:   None established   Completed by other providers:   None reported      Return for (ECT): (B) L-FCT Blk #2 (PRN).    Recent Visits Date Type Provider Dept  11/26/23 Office Visit Renaldo Caroli, MD [facility]  10/28/23 Procedure visit Renaldo Caroli, MD [facility]  10/13/23 Office Visit Renaldo Caroli, MD [facility]  09/22/23 Office Visit Renaldo Caroli, MD [facility]  Showing recent visits within past 90 days and meeting all other requirements Future Appointments No visits were found meeting these conditions. Showing future appointments within next 90 days and meeting all other requirements  I discussed the assessment and treatment plan with the patient. The patient was provided  an opportunity to ask questions and all were answered. The patient agreed with the plan and demonstrated an understanding of the instructions.  Patient advised to call back or seek an in-person evaluation if the symptoms or condition worsens.  Duration of encounter: 30 minutes.  Total time on encounter, as per AMA guidelines included both the face-to-face and non-face-to-face time personally spent by the physician and/or other qualified health care professional(s) on the day of the encounter (includes time in activities that require the physician or other qualified health care professional and does not include time in activities normally performed by clinical staff). Physician's time may include the following activities when performed: Preparing to see the patient (e.g., pre-charting review of records, searching for previously ordered imaging,  lab work, and nerve conduction tests) Review of prior analgesic pharmacotherapies. Reviewing PMP Interpreting ordered tests (e.g., lab work, imaging, nerve conduction tests) Performing post-procedure evaluations, including interpretation of diagnostic procedures Obtaining and/or reviewing separately obtained history Performing a medically appropriate examination and/or evaluation Counseling and educating the patient/family/caregiver Ordering medications, tests, or procedures Referring and communicating with other health care professionals (when not separately reported) Documenting clinical information in the electronic or other health record Independently interpreting results (not separately reported) and communicating results to the patient/ family/caregiver Care coordination (not separately reported)  Note by: Candi Chafe, MD (TTS and AI technology used. I apologize for any typographical errors that were not detected and corrected.) Date: 11/26/2023; Time: 7:58 AM

## 2023-11-26 ENCOUNTER — Encounter: Payer: Self-pay | Admitting: Pain Medicine

## 2023-11-26 ENCOUNTER — Ambulatory Visit: Attending: Pain Medicine | Admitting: Pain Medicine

## 2023-11-26 VITALS — BP 128/64 | HR 75 | Temp 97.9°F | Resp 16 | Ht 64.0 in | Wt 284.0 lb

## 2023-11-26 DIAGNOSIS — M545 Low back pain, unspecified: Secondary | ICD-10-CM | POA: Insufficient documentation

## 2023-11-26 DIAGNOSIS — E1169 Type 2 diabetes mellitus with other specified complication: Secondary | ICD-10-CM | POA: Insufficient documentation

## 2023-11-26 DIAGNOSIS — M47816 Spondylosis without myelopathy or radiculopathy, lumbar region: Secondary | ICD-10-CM | POA: Insufficient documentation

## 2023-11-26 DIAGNOSIS — G8929 Other chronic pain: Secondary | ICD-10-CM | POA: Insufficient documentation

## 2023-11-26 DIAGNOSIS — M5459 Other low back pain: Secondary | ICD-10-CM | POA: Diagnosis not present

## 2023-11-26 DIAGNOSIS — Z09 Encounter for follow-up examination after completed treatment for conditions other than malignant neoplasm: Secondary | ICD-10-CM | POA: Insufficient documentation

## 2023-11-26 DIAGNOSIS — Z6841 Body Mass Index (BMI) 40.0 and over, adult: Secondary | ICD-10-CM | POA: Insufficient documentation

## 2023-11-26 DIAGNOSIS — E669 Obesity, unspecified: Secondary | ICD-10-CM | POA: Insufficient documentation

## 2023-11-26 NOTE — Patient Instructions (Addendum)
 ______________________________________________________________________    Patient information on: Body mass index (BMI) and Weight Management  Dear Michelle Marks you are receiving this information because your weight may be adversely affecting your health.   Your current Estimated body mass index is 48.75 kg/m as calculated from the following:   Height as of this encounter: 5' 4 (1.626 m).   Weight as of this encounter: 284 lb (128.8 kg).  We recommend you talk to your primary care physician about providing or referring you to a supervised weight management program.  Here is some information about weight and the body mass index (BMI) classification:  BMI is a measure of obesity that's calculated by dividing a person's weight in kilograms by their height in meters squared. A person can use an online calculator to determine their BMI. Body mass index (BMI) is a common tool for deciding whether a person has an appropriate body weight.  It measures a person's weight in relation to their height.  According to the [facility] of health (NIH): A BMI of less than 18.5 means that a person is underweight. A BMI of between 18.5 and 24.9 is ideal. A BMI of between 25 and 29.9 is overweight. A BMI over 30 indicates obesity.  Body Mass Index (BMI) Classification BMI level (kg/m2) Category Associated incidence of chronic pain  <18  Underweight   18.5-24.9 Ideal body weight   25-29.9 Overweight  20%  30-34.9 Obese (Class I)  68%  35-39.9 Severe obesity (Class II)  136%  >40 Extreme obesity (Class III)  254%    Morbidly Obese Classification: You will be considered to be Morbidly Obese if your BMI is above 30 and you have one or more of the following conditions caused or associated to obesity: 1.    Type 2 Diabetes (Leading to cardiovascular diseases (CVD), stroke, peripheral vascular diseases (PVD), retinopathy, nephropathy, and neuropathy) 2.    Cardiovascular Disease (High Blood  Pressure; Congestive Heart Failure; High Cholesterol; Coronary Artery Disease; Angina; Arrhythmias, Dysrhythmias, or Heart Attacks) 3.    Breathing problems (Asthma; obesity-hypoventilation syndrome; obstructive sleep apnea; chronic inflammatory airway disease; reactive airway disease; or shortness of breath) 4.    Chronic kidney disease 5.    Liver disease (nonalcoholic fatty liver disease) 6.    High blood pressure 7.    Acid reflux (gastroesophageal reflux disease; heartburn) 8.    Osteoarthritis (OA) (affecting the hip(s), the knee(s) and/or the lower back) (usually requiring knee and/or hip replacements, as well as back surgeries) 9.    Low back pain (Lumbar Facet Syndrome; and/or Degenerative Disc Disease) 10.  Hip pain (Osteoarthritis of hip) (For every 1 lbs of added body weight, there is a 2 lbs increase in pressure inside of each hip articulation. 1:2 mechanical relationship) 11.  Knee pain (Osteoarthritis of knee) (For every 1 lbs of added body weight, there is a 4 lbs increase in pressure inside of each knee articulation. 1:4 mechanical relationship) (patients with a BMI>30 kg/m2 were 6.8 times more likely to develop knee OA than normal-weight individuals) 12.  Cancer: Epidemiological studies have shown that obesity is a risk factor for: post-menopausal breast cancer; cancers of the endometrium, colon and kidney cancer; malignant adenomas of the esophagus. Obese subjects have an approximately 1.5-3.5-fold increased risk of developing these cancers compared with normal-weight subjects, and it has been estimated that between 15 and 45% of these cancers can be attributed to overweight. More recent studies suggest that obesity may also increase the risk of other types  of cancer, including pancreatic, hepatic and gallbladder cancer. (Ref: Obesity and cancer. Pischon T, Nthlings U, [employer] H. Proc Nutr Soc. 2008 May;67(2):128-45. doi: 10.1017/S0029665108006976.) The International Agency for Research  on Cancer (IARC) has identified 13 cancers associated with overweight and obesity: meningioma, multiple myeloma, adenocarcinoma of the esophagus, and cancers of the thyroid , postmenopausal breast cancer, gallbladder, stomach, liver, pancreas, kidney, ovaries, uterus, colon and rectal (colorectal) cancers. 55 percent of all cancers diagnosed in women and 24 percent of those diagnosed in men are associated with overweight and obesity.  Recommendation: If you have any of the above conditions it is urgent that you take a step back and concentrate in losing weight. Dedicate 100% of your efforts on this task. Nothing else will improve your health more than bringing your weight down and your BMI to less than 30.   Nutritionist and/or supervised weight-management program: We are aware that most chronic pain patients are unable to exercise secondary to their pain. For this reason, you must rely on proper nutrition and diet in order to lose the weight. We recommend you talk to a nutritionist.   Bariatric surgery: A person might be considered a candidate for bariatric surgery if they meet one of the following BMI criteria:  BMI of 40 or higher: This is considered extreme obesity (Class III). BMI of 35-39.9: This is considered obesity, and the person might also have a serious weight-related health condition, such as high blood pressure, type 2 diabetes, or severe sleep apnea  BMI of 30-34.9: This might be considered if the person has serious weight-related health problems and hasn't had substantial weight loss or improvement in co-morbidities through other methods   On your own: A realistic goal is to lose 10% of your body weight over a period of 12 months.  If over a period of six (6) months you have unsuccessfully tried to lose weight, then it is time for you to seek professional help and to enter a medically supervised weight management program, and/or undergo bariatric surgery.   Pain management considerations  and possible limitations:  1.    Pharmacological Problems: Be advised that the use of opioid analgesics (oxycodone ; hydrocodone; morphine; methadone; codeine; and all of their derivatives) have been associated with decreased metabolism and weight gain.  For this reason, should we see that you are unable to lose weight while taking these medications, it may become necessary for us  to taper down and indefinitely discontinue them.  2.    Technical Problems: The incidence of successful interventional therapies decreases as the patient's BMI increases. It is much more difficult to accomplish a safe and effective interventional therapy on a patient with a BMI above 35. 3.    Radiation Exposure Problems: The x-rays machine, used to accomplish injection therapies, will automatically increase their x-ray output in order to capture an appropriate bone image. This means that radiation exposure increases exponentially with the patient's BMI. (The higher the BMI, the higher the radiation exposure.) Although the level of radiation used at a given time is still safe to the patient, it is not for the physician and/or assisting staff. Unfortunately, radiation exposure is accumulative. Because physicians and the staff have to do procedures and be exposed on a daily basis, this can result in health problems such as cancer and radiation burns. Radiation exposure to the staff is monitored by the radiation batches that they wear. The exposure levels are reported back to the staff on a quarterly basis. Depending on levels of exposure, physicians  and staff may be obligated by law to decrease this exposure. This means that they have the right and obligation to refuse providing therapies where they may be overexposed to radiation. For this reason, physicians may decline to offer therapies such as radiofrequency ablation or implants to patients with a BMI above 40. 4.    Current Trends: Be advised that the current trend is to no longer  offer certain therapies to patients with a BMI equal to, or above 35, due to increase perioperative risks, increased technical procedural difficulties, and excessive radiation exposure to healthcare personnel.  Last updated: 03/10/2023 ______________________________________________________________________      ______________________________________________________________________    Procedure instructions  Stop blood-thinners  Do not eat or drink fluids (other than water) for 6 hours before your procedure  No water for 2 hours before your procedure  Take your blood pressure medicine with a sip of water  Arrive 30 minutes before your appointment  If sedation is planned, bring suitable driver. Teddie Favre, [employer], & public transportation are NOT APPROVED)  Carefully read the Preparing for your procedure detailed instructions  If you have questions call us  at [phone]  Procedure appointments are for procedures only.   NO medication refills or new problem evaluations will be done on procedure days.   Only the scheduled, pre-approved procedure and side will be done.   ______________________________________________________________________      ______________________________________________________________________    Preparing for your procedure  Appointments: If you think you may not be able to keep your appointment, call 24-48 hours in advance to cancel. We need time to make it available to others.  Procedure visits are for procedures only. During your procedure appointment there will be: NO Prescription Refills*. NO medication changes or discussions*. NO discussion of disability issues*. NO unrelated pain problem evaluations*. NO evaluations to order other pain procedures*. *These will be addressed at a separate and distinct evaluation encounter on the provider's evaluation schedule and not during procedure days.  Instructions: Food intake: Avoid eating anything solid  for at least 8 hours prior to your procedure. Clear liquid intake: You may take clear liquids such as water up to 2 hours prior to your procedure. (No carbonated drinks. No soda.) Transportation: Unless otherwise stated by your physician, bring a driver. (Driver cannot be a Market researcher, Pharmacist, community, or any other form of public transportation.) Morning Medicines: Except for blood thinners, take all of your other morning medications with a sip of water. Make sure to take your heart and blood pressure medicines. If your blood pressure's lower number is above 100, the case will be rescheduled. Blood thinners: Make sure to stop your blood thinners as instructed.  If you take a blood thinner, but were not instructed to stop it, call our office [phone] and ask to talk to a nurse. Not stopping a blood thinner prior to certain procedures could lead to serious complications. Diabetics on insulin: Notify the staff so that you can be scheduled 1st case in the morning. If your diabetes requires high dose insulin, take only  of your normal insulin dose the morning of the procedure and notify the staff that you have done so. Preventing infections: Shower with an antibacterial soap the morning of your procedure.  Build-up your immune system: Take 1000 mg of Vitamin C with every meal (3 times a day) the day prior to your procedure. Antibiotics: Inform the nursing staff if you are taking any antibiotics or if you have any conditions that may require antibiotics prior to  procedures. (Example: recent joint implants)   Pregnancy: If you are pregnant make sure to notify the nursing staff. Not doing so may result in injury to the fetus, including death.  Sickness: If you have a cold, fever, or any active infections, call and cancel or reschedule your procedure. Receiving steroids while having an infection may result in complications. Arrival: You must be in the facility at least 30 minutes prior to your scheduled  procedure. Tardiness: Your scheduled time is also the cutoff time. If you do not arrive at least 15 minutes prior to your procedure, you will be rescheduled.  Children: Do not bring any children with you. Make arrangements to keep them home. Dress appropriately: There is always a possibility that your clothing may get soiled. Avoid long dresses. Valuables: Do not bring any jewelry or valuables.  Reasons to call and reschedule or cancel your procedure: (Following these recommendations will minimize the risk of a serious complication.) Surgeries: Avoid having procedures within 2 weeks of any surgery. (Avoid for 2 weeks before or after any surgery). Flu Shots: Avoid having procedures within 2 weeks of a flu shots or . (Avoid for 2 weeks before or after immunizations). Barium: Avoid having a procedure within 7-10 days after having had a radiological study involving the use of radiological contrast. (Myelograms, Barium swallow or enema study). Heart attacks: Avoid any elective procedures or surgeries for the initial 6 months after a Myocardial Infarction (Heart Attack). Blood thinners: It is imperative that you stop these medications before procedures. Let us  know if you if you take any blood thinner.  Infection: Avoid procedures during or within two weeks of an infection (including chest colds or gastrointestinal problems). Symptoms associated with infections include: Localized redness, fever, chills, night sweats or profuse sweating, burning sensation when voiding, cough, congestion, stuffiness, runny nose, sore throat, diarrhea, nausea, vomiting, cold or Flu symptoms, recent or current infections. It is specially important if the infection is over the area that we intend to treat. Heart and lung problems: Symptoms that may suggest an active cardiopulmonary problem include: cough, chest pain, breathing difficulties or shortness of breath, dizziness, ankle swelling, uncontrolled high or unusually low  blood pressure, and/or palpitations. If you are experiencing any of these symptoms, cancel your procedure and contact your primary care physician for an evaluation.  Remember:  Regular Business hours are:  Monday to Thursday 8:00 AM to 4:00 PM  Provider's Schedule: Renaldo Caroli, MD:  Procedure days: Tuesday and Thursday 7:30 AM to 4:00 PM  Cephus Collin, MD:  Procedure days: Monday and Wednesday 7:30 AM to 4:00 PM Last  Updated: 05/27/2023 ______________________________________________________________________      ______________________________________________________________________    General Risks and Possible Complications  Patient Responsibilities: It is important that you read this as it is part of your informed consent. It is our duty to inform you of the risks and possible complications associated with treatments offered to you. It is your responsibility as a patient to read this and to ask questions about anything that is not clear or that you believe was not covered in this document.  Patient's Rights: You have the right to refuse treatment. You also have the right to change your mind, even after initially having agreed to have the treatment done. However, under this last option, if you wait until the last second to change your mind, you may be charged for the materials used up to that point.  Introduction: Medicine is not an Visual merchandiser. Everything in Medicine, including  the lack of treatment(s), carries the potential for danger, harm, or loss (which is by definition: Risk). In Medicine, a complication is a secondary problem, condition, or disease that can aggravate an already existing one. All treatments carry the risk of possible complications. The fact that a side effects or complications occurs, does not imply that the treatment was conducted incorrectly. It must be clearly understood that these can happen even when everything is done following the highest safety  standards.  No treatment: You can choose not to proceed with the proposed treatment alternative. The "PRO(s)" would include: avoiding the risk of complications associated with the therapy. The "CON(s)" would include: not getting any of the treatment benefits. These benefits fall under one of three categories: diagnostic; therapeutic; and/or palliative. Diagnostic benefits include: getting information which can ultimately lead to improvement of the disease or symptom(s). Therapeutic benefits are those associated with the successful treatment of the disease. Finally, palliative benefits are those related to the decrease of the primary symptoms, without necessarily curing the condition (example: decreasing the pain from a flare-up of a chronic condition, such as incurable terminal cancer).  General Risks and Complications: These are associated to most interventional treatments. They can occur alone, or in combination. They fall under one of the following six (6) categories: no benefit or worsening of symptoms; bleeding; infection; nerve damage; allergic reactions; and/or death. No benefits or worsening of symptoms: In Medicine there are no guarantees, only probabilities. No healthcare provider can ever guarantee that a medical treatment will work, they can only state the probability that it may. Furthermore, there is always the possibility that the condition may worsen, either directly, or indirectly, as a consequence of the treatment. Bleeding: This is more common if the patient is taking a blood thinner, either prescription or over the counter (example: Goody Powders, Fish oil, Aspirin, Garlic, etc.), or if suffering a condition associated with impaired coagulation (example: Hemophilia, cirrhosis of the liver, low platelet counts, etc.). However, even if you do not have one on these, it can still happen. If you have any of these conditions, or take one of these drugs, make sure to notify your treating  physician. Infection: This is more common in patients with a compromised immune system, either due to disease (example: diabetes, cancer, human immunodeficiency virus [HIV], etc.), or due to medications or treatments (example: therapies used to treat cancer and rheumatological diseases). However, even if you do not have one on these, it can still happen. If you have any of these conditions, or take one of these drugs, make sure to notify your treating physician. Nerve Damage: This is more common when the treatment is an invasive one, but it can also happen with the use of medications, such as those used in the treatment of cancer. The damage can occur to small secondary nerves, or to large primary ones, such as those in the spinal cord and brain. This damage may be temporary or permanent and it may lead to impairments that can range from temporary numbness to permanent paralysis and/or brain death. Allergic Reactions: Any time a substance or material comes in contact with our body, there is the possibility of an allergic reaction. These can range from a mild skin rash (contact dermatitis) to a severe systemic reaction (anaphylactic reaction), which can result in death. Death: In general, any medical intervention can result in death, most of the time due to an unforeseen complication. ______________________________________________________________________

## 2023-11-26 NOTE — Progress Notes (Signed)
 Safety precautions to be maintained throughout the outpatient stay will include: orient to surroundings, keep bed in low position, maintain call bell within reach at all times, provide assistance with transfer out of bed and ambulation.

## 2023-12-05 ENCOUNTER — Other Ambulatory Visit: Payer: Self-pay

## 2023-12-05 NOTE — Patient Outreach (Incomplete)
 Complex Care Management   Visit Note  12/05/2023  Name:  Michelle Marks MRN: [id number] DOB: October 10, 1950  Situation: Referral received for Complex Care Management related to {Criteria:32550} I obtained verbal consent from {[facility] AMB Patient/Caregiver:28184}.  Visit completed with ***  {VISIT LOCATION:32553}  Background:   Past Medical History:  Diagnosis Date   Anxiety    Arthritis    Difficult intubation    Edema, leg    Endometrial polyp    s/p Hysteroscopy D&C with polypectomy in 11/2016   GERD (gastroesophageal reflux disease)    Hyperlipidemia    Morbid obesity (HCC) 10/24/2016    Assessment: Patient Reported Symptoms:  Cognitive        Neurological      HEENT        Cardiovascular      Respiratory      Endocrine      Gastrointestinal        Genitourinary      Integumentary      Musculoskeletal          Psychosocial       Do you feel physically threatened by others?: No      11/26/2023   12:47 PM  Depression screen PHQ 2/9  Decreased Interest 0  Down, Depressed, Hopeless 1  PHQ - 2 Score 1    There were no vitals filed for this visit.  Medications Reviewed Today     Reviewed by Roxie Cord, RN (Registered Nurse) on 12/05/23 at 1559  Med List Status: <None>   Medication Order Taking? Sig Documenting Provider Last Dose Status Informant  Accu-Chek FastClix Lancets MISC [id number] No 1 each by Does not apply route daily before breakfast. Cannady, Jolene T, NP Taking Active   atorvastatin  (LIPITOR) 40 MG tablet [id number] No Take 1 tablet (40 mg total) by mouth daily. Cannady, Jolene T, NP Taking Active   augmented betamethasone  dipropionate (DIPROLENE -AF) 0.05 % cream [id number] No Apply 1 Application topically 2 (two) times daily as needed (to affected skin areas). Doran Galloway T, NP Taking Active   Blood Glucose Monitoring Suppl (ACCU-CHEK GUIDE) w/Device KIT [id number] No To check blood sugar twice a day with goal fasting in morning <130 and  goal 2 hours after meal <180.  Write all checks down for provider visits. Cannady, Jolene T, NP Taking Active   diazepam  (VALIUM ) 5 MG tablet [id number] No Take 1 tablet (5 mg total) by mouth 60 (sixty) minutes before procedure for 1 dose. 1 tab PO 60 min pre-MRI. If still anxious, take 2nd tab 15 min just before MRI. Max: 2 tbs (10 mg). Avoid taking opioid pain medications within 4 hours of taking valium . Must have a driver. Do not drive or operate machinery x 24 hours after taking this medication. Renaldo Caroli, MD Taking Active   ergocalciferol  (VITAMIN D2) 1.25 MG (50000 UT) capsule [id number] No Take 1 capsule (50,000 Units total) by mouth once a week. Doran Galloway T, NP Taking Active   esomeprazole  (NEXIUM ) 40 MG capsule [id number] No TAKE 1 CAPSULE BY MOUTH DAILY Cannady, Jolene T, NP Taking Active   glucose blood test strip [id number] No To check blood sugar daily prior to eating in morning, with goal less the 130. Cannady, Jolene T, NP Taking Active   levothyroxine  (SYNTHROID ) 75 MCG tablet [id number] No Take 1 tablet (75 mcg total) by mouth daily before breakfast. Cannady, Jolene T, NP Taking Active   torsemide  (DEMADEX ) 100 MG tablet [id number] No Take 1 tablet (100 mg total)  by mouth daily. Lemar Pyles, NP Taking Active   Med List Note Huston Maiers, RN 09/22/23 1350): UDS 09/22/23            Recommendation:   {RECOMMENDATONS:32554}  Follow Up Plan:   {FOLLOWUP:32559}  SIG ***

## 2023-12-08 ENCOUNTER — Encounter: Admitting: Obstetrics & Gynecology

## 2023-12-08 NOTE — Patient Instructions (Signed)
 Thank you for allowing the Complex Care Management team to participate in your care. It was great speaking with you!  We will follow up on January 12, 2024 at 3:30. Please do not hesitate to contact me if you require assistance prior to our next outreach.    Jackson Acron [facility] RN Care Manager Direct Dial: [phone]  Fax: [phone] Website: [email or web address]

## 2024-01-12 ENCOUNTER — Telehealth: Payer: Self-pay

## 2024-01-27 DIAGNOSIS — M1611 Unilateral primary osteoarthritis, right hip: Secondary | ICD-10-CM | POA: Diagnosis not present

## 2024-01-27 DIAGNOSIS — M1711 Unilateral primary osteoarthritis, right knee: Secondary | ICD-10-CM | POA: Diagnosis not present

## 2024-02-01 NOTE — Patient Instructions (Signed)

## 2024-02-06 ENCOUNTER — Ambulatory Visit (INDEPENDENT_AMBULATORY_CARE_PROVIDER_SITE_OTHER): Payer: Medicare Other | Admitting: Nurse Practitioner

## 2024-02-06 ENCOUNTER — Ambulatory Visit: Payer: Self-pay | Admitting: Nurse Practitioner

## 2024-02-06 ENCOUNTER — Encounter: Payer: Self-pay | Admitting: Nurse Practitioner

## 2024-02-06 VITALS — BP 125/79 | HR 78 | Temp 97.8°F | Ht 63.9 in | Wt 278.8 lb

## 2024-02-06 DIAGNOSIS — R809 Proteinuria, unspecified: Secondary | ICD-10-CM | POA: Diagnosis not present

## 2024-02-06 DIAGNOSIS — G894 Chronic pain syndrome: Secondary | ICD-10-CM

## 2024-02-06 DIAGNOSIS — E039 Hypothyroidism, unspecified: Secondary | ICD-10-CM

## 2024-02-06 DIAGNOSIS — E559 Vitamin D deficiency, unspecified: Secondary | ICD-10-CM | POA: Diagnosis not present

## 2024-02-06 DIAGNOSIS — E785 Hyperlipidemia, unspecified: Secondary | ICD-10-CM | POA: Diagnosis not present

## 2024-02-06 DIAGNOSIS — I890 Lymphedema, not elsewhere classified: Secondary | ICD-10-CM

## 2024-02-06 DIAGNOSIS — E669 Obesity, unspecified: Secondary | ICD-10-CM | POA: Diagnosis not present

## 2024-02-06 DIAGNOSIS — E1129 Type 2 diabetes mellitus with other diabetic kidney complication: Secondary | ICD-10-CM | POA: Diagnosis not present

## 2024-02-06 DIAGNOSIS — E1169 Type 2 diabetes mellitus with other specified complication: Secondary | ICD-10-CM | POA: Diagnosis not present

## 2024-02-06 DIAGNOSIS — R030 Elevated blood-pressure reading, without diagnosis of hypertension: Secondary | ICD-10-CM | POA: Diagnosis not present

## 2024-02-06 DIAGNOSIS — Z6841 Body Mass Index (BMI) 40.0 and over, adult: Secondary | ICD-10-CM

## 2024-02-06 LAB — BAYER DCA HB A1C WAIVED: HB A1C (BAYER DCA - WAIVED): 6.9 % — ABNORMAL HIGH (ref 4.8–5.6)

## 2024-02-06 MED ORDER — ATORVASTATIN CALCIUM 40 MG PO TABS: 40.0000 mg | ORAL_TABLET | Freq: Every day | ORAL | 3 refills | Status: DC

## 2024-02-06 MED ORDER — ESOMEPRAZOLE MAGNESIUM 40 MG PO CPDR: 40.0000 mg | DELAYED_RELEASE_CAPSULE | Freq: Every day | ORAL | 2 refills | Status: AC

## 2024-02-06 NOTE — Progress Notes (Signed)
 Contacted via MyChart  A1c is coming down with your diet changes from 7.4% to 6.9%.:)

## 2024-02-06 NOTE — Assessment & Plan Note (Signed)
 Chronic, ongoing.  Continue current medication regimen and adjust regimen as needed.  Thyroid labs obtained today.

## 2024-02-06 NOTE — Assessment & Plan Note (Signed)
 A1c trend down to 6.9%. Discussed at length with her.  She does not want to start medication, wishes to continue focus on diet reducing carbohydrate and sugar + increasing chair exercises.  Will trial this, if elevated then consider a GLP1, which may offer benefit to weight and A1c -- educated her on all options today.  Would avoid SGLT2 due to her difficulty getting to bathroom and concern for fall.  Urine ALB ordered, she will obtain at home and bring to office. - No ACE/ARB or statin - refuses - Refuses vaccinations - Up To Date eye exam.  Foot exam up to date.

## 2024-02-06 NOTE — Progress Notes (Signed)
 BP 125/79   Pulse 78   Temp 97.8 F (36.6 C) (Oral)   Ht 5' 3.9 (1.623 m)   Wt 278 lb 12.8 oz (126.5 kg)   SpO2 98%   BMI 48.01 kg/m    Subjective:    Patient ID: Michelle Marks, female    DOB: 1950/10/06, 73 y.o.   MRN: [id number]  HPI: Michelle Marks is a 73 y.o. female  Chief Complaint  Patient presents with   Diabetes   Hyperlipidemia   Hypertension   Hypothyroidism   DIABETES A1c was 7.4% in April, no current medications. Hypoglycemic episodes:no Polydipsia/polyuria: no Visual disturbance: no Chest pain: no Paresthesias: no Glucose Monitoring: no  Accucheck frequency: daily fasting and non fasting  Fasting glucose: 133 to 156  Post prandial:  Evening: 171 to 281  Before meals: Taking Insulin?: no  Long acting insulin:  Short acting insulin: Blood Pressure Monitoring: not checking Retinal Examination: Up To Date Foot Exam: Up to Date Diabetic Education: Not Completed Pneumovax: Up to Date Influenza: Up to Date Aspirin: no   HYPERTENSION / HYPERLIPIDEMIA Continues Torsemide  100 MG daily.   Satisfied with current treatment? yes Duration of hypertension: chronic BP monitoring frequency: not checking BP range:  BP medication side effects: no Duration of hyperlipidemia: chronic Cholesterol medication side effects: no Cholesterol supplements: none Medication compliance: good compliance Aspirin: no Recent stressors: no Recurrent headaches: no Visual changes: no Palpitations: no Dyspnea: no Chest pain: no Lower extremity edema: at baseline Dizzy/lightheaded: no   HYPOTHYROIDISM Continues Levothyroxine  75 MCG daily. Thyroid  control status:stable Satisfied with current treatment? yes Medication side effects: no Medication compliance: good compliance Etiology of hypothyroidism: unknown Recent dose adjustment:no Fatigue: no Cold intolerance: no Heat intolerance: no Weight gain: no Weight loss: no Constipation: no Diarrhea/loose stools:  no Palpitations: no Lower extremity edema: yes Anxiety/depressed mood: no   BACK PAIN Underlying lymphedema, did go to vascular with last visit on 07/23/22. Uses compression pumps and wraps.  Saw pain management last 11/26/23, reports that she has received injections.  Tramadol  did not offer benefit in past. Has tried Gabapentin , Lyrica , Tizanidine  all without benefit to pain.  Does have elevation of uric acid on labs, did not try Allopurinol .  MRI lumbar spine 2006 did note lateral disc extrusion L5-S1 with nerve root compression + degenerative changes to multiple areas and ligamentum flavum.   Duration: weeks Mechanism of injury: unknown Location: bilateral and low back Onset: gradual Severity: 6/10 Quality: sharp, shooting, and throbbing Frequency: constant Radiation: buttocks Aggravating factors: lying down Alleviating factors: nothing at present Status: fluctuating Treatments attempted: Tramadol , Gabapentin , Lyrica , Tylenol , Hempvana Relief with NSAIDs?: No NSAIDs Taken Nighttime pain:  no Paresthesias / decreased sensation:  no Bowel / bladder incontinence:  no Fevers:  no Dysuria / urinary frequency:  no      02/06/2024    1:13 PM 12/05/2023    4:13 PM 11/26/2023   12:47 PM 10/28/2023   12:34 PM 10/16/2023    4:15 PM  Depression screen PHQ 2/9  Decreased Interest 0 0 0 0 0  Down, Depressed, Hopeless 0 0 1 0 0  PHQ - 2 Score 0 0 1 0 0  Altered sleeping 3      Tired, decreased energy 2      Change in appetite 0      Feeling bad or failure about yourself  0      Trouble concentrating 0      Moving slowly or fidgety/restless 0  Suicidal thoughts 0      PHQ-9 Score 5      Difficult doing work/chores Not difficult at all           02/06/2024    1:14 PM 10/16/2023    4:15 PM 01/06/2023    1:59 PM 11/20/2022    1:53 PM  GAD 7 : Generalized Anxiety Score  Nervous, Anxious, on Edge 1 1 2  0  Control/stop worrying 1 1 1 1   Worry too much - different things 0 1 1 0  Trouble  relaxing 0 1 0 0  Restless 0 0 0 0  Easily annoyed or irritable 0 0 1 1  Afraid - awful might happen 0 0 1 0  Total GAD 7 Score 2 4 6 2   Anxiety Difficulty Not difficult at all Not difficult at all Somewhat difficult Not difficult at all   Relevant past medical, surgical, family and social history reviewed and updated as indicated. Interim medical history since our last visit reviewed. Allergies and medications reviewed and updated.  Review of Systems  Constitutional:  Negative for activity change, appetite change, diaphoresis, fatigue and fever.  Respiratory:  Negative for cough, chest tightness and shortness of breath.   Cardiovascular:  Positive for leg swelling. Negative for chest pain and palpitations.  Gastrointestinal: Negative.   Endocrine: Negative for cold intolerance, heat intolerance, polydipsia, polyphagia and polyuria.  Musculoskeletal:  Positive for arthralgias and back pain.  Neurological: Negative.   Psychiatric/Behavioral: Negative.     Per HPI unless specifically indicated above     Objective:    BP 125/79   Pulse 78   Temp 97.8 F (36.6 C) (Oral)   Ht 5' 3.9 (1.623 m)   Wt 278 lb 12.8 oz (126.5 kg)   SpO2 98%   BMI 48.01 kg/m   Wt Readings from Last 3 Encounters:  02/06/24 278 lb 12.8 oz (126.5 kg)  11/26/23 284 lb (128.8 kg)  10/28/23 284 lb (128.8 kg)    Physical Exam Vitals and nursing note reviewed.  Constitutional:      General: She is awake. She is not in acute distress.    Appearance: She is well-developed and well-groomed. She is obese. She is not ill-appearing or toxic-appearing.     Comments: In wheelchair per baseline.  HENT:     Head: Normocephalic.     Right Ear: Hearing and external ear normal.     Left Ear: Hearing and external ear normal.  Eyes:     General: Lids are normal.        Right eye: No discharge.        Left eye: No discharge.     Conjunctiva/sclera: Conjunctivae normal.     Pupils: Pupils are equal, round, and  reactive to light.  Neck:     Thyroid : No thyromegaly.     Vascular: No carotid bruit.  Cardiovascular:     Rate and Rhythm: Normal rate and regular rhythm.     Heart sounds: Normal heart sounds. No murmur heard.    No gallop.     Comments: Dry skin both legs.Wraps in place. Pulmonary:     Effort: Pulmonary effort is normal. No accessory muscle usage or respiratory distress.     Breath sounds: Normal breath sounds.  Abdominal:     General: Bowel sounds are normal. There is no distension.     Palpations: Abdomen is soft.     Tenderness: There is no abdominal tenderness.  Musculoskeletal:  Cervical back: Normal range of motion and neck supple.     Right lower leg: 2+ Edema present.     Left lower leg: 2+ Edema present.  Lymphadenopathy:     Cervical: No cervical adenopathy.  Skin:    General: Skin is warm and dry.  Neurological:     Mental Status: She is alert and oriented to person, place, and time.     Deep Tendon Reflexes: Reflexes are normal and symmetric.     Reflex Scores:      Brachioradialis reflexes are 2+ on the right side and 2+ on the left side.      Patellar reflexes are 2+ on the right side and 2+ on the left side. Psychiatric:        Attention and Perception: Attention normal.        Mood and Affect: Mood normal.        Speech: Speech normal.        Behavior: Behavior normal. Behavior is cooperative.        Thought Content: Thought content normal.    Results for orders placed or performed in visit on 02/06/24  Bayer DCA Hb A1c Waived   Collection Time: 02/06/24  1:52 PM  Result Value Ref Range   HB A1C (BAYER DCA - WAIVED) 6.9 (H) 4.8 - 5.6 %      Assessment & Plan:   Problem List Items Addressed This Visit       Cardiovascular and Mediastinum   White coat syndrome without diagnosis of hypertension   Chronic, stable.  BP at goal in office today.  Recommend she monitor BP at least a few mornings a week at home and document.  DASH diet at home.   Continue current medication regimen and adjust as needed.  Labs today: CMP and lipid.         Relevant Medications   atorvastatin  (LIPITOR) 40 MG tablet     Endocrine   Type 2 diabetes mellitus with proteinuria (HCC)   A1c trend down to 6.9%. Discussed at length with her.  She does not want to start medication, wishes to continue focus on diet reducing carbohydrate and sugar + increasing chair exercises.  Will trial this, if elevated then consider a GLP1, which may offer benefit to weight and A1c -- educated her on all options today.  Would avoid SGLT2 due to her difficulty getting to bathroom and concern for fall.  Urine ALB ordered, she will obtain at home and bring to office. - No ACE/ARB or statin - refuses - Refuses vaccinations - Up To Date eye exam.  Foot exam up to date.      Relevant Medications   atorvastatin  (LIPITOR) 40 MG tablet   Other Relevant Orders   CBC with Differential/Platelet   Urine Microalbumin w/creat. ratio   Type 2 diabetes mellitus with obesity (HCC) - Primary   A1c trend down to 6.9%. Discussed at length with her.  She does not want to start medication, wishes to continue focus on diet reducing carbohydrate and sugar + increasing chair exercises.  Will trial this, if elevated then consider a GLP1, which may offer benefit to weight and A1c -- educated her on all options today.  Would avoid SGLT2 due to her difficulty getting to bathroom and concern for fall.  Urine ALB ordered, she will obtain at home and bring to office. - No ACE/ARB or statin - refuses - Refuses vaccinations - Up To Date eye exam.  Foot exam up  to date.      Relevant Medications   atorvastatin  (LIPITOR) 40 MG tablet   Other Relevant Orders   Bayer DCA Hb A1c Waived (Completed)   Urine Microalbumin w/creat. ratio   Hypothyroidism   Chronic, ongoing.  Continue current medication regimen and adjust regimen as needed.  Thyroid  labs obtained today.      Relevant Orders   T4, free   TSH    Hyperlipidemia associated with type 2 diabetes mellitus (HCC)   Chronic, ongoing.  Continue current medication regimen and adjust as needed.  Lipid panel and CMP today.        Relevant Medications   atorvastatin  (LIPITOR) 40 MG tablet   Other Relevant Orders   Bayer DCA Hb A1c Waived (Completed)   Comprehensive metabolic panel with GFR   Lipid Panel w/o Chol/HDL Ratio     Other   Morbid obesity with body mass index (BMI) of 45.0 to 49.9 in adult [facility]) (Chronic)   BMI 48.01 with some loss present.  Recommended eating smaller high protein, low fat meals more frequently and exercising 30 mins a day 5 times a week with a goal of 10-15lb weight loss in the next 3 months. Patient voiced their understanding and motivation to adhere to these recommendations.       Chronic pain syndrome (Chronic)   Chronic, ongoing. Mainly to lower back and hips. She is following with pain management, continue this collaboration. She would like oral pain meds in future to help pain at night, Tramadol  she reports has never worked. Continue to collaborate with pain management and orthopedics.      Vitamin D  deficiency   Chronic, ongoing.  Continue weekly Vitamin D , refills sent up to date.  Check Vit D level today and recommend she obtain DEXA as ordered last visit.      Relevant Orders   VITAMIN D  25 Hydroxy (Vit-D Deficiency, Fractures)   Lymphedema in adult patient   Chronic, ongoing.  Continue collaboration with vascular.  At this time continue Torsemide  as ordered by previous provider, although limited evidence for this being beneficial in lymphedema. Recommend continue compression wraps at home and take Tylenol  1000 MG TID as needed.          Follow up plan: Return in about 6 months (around 08/08/2024) for T2DM, HTN/HLD.

## 2024-02-06 NOTE — Assessment & Plan Note (Addendum)
 BMI 48.01 with some loss present.  Recommended eating smaller high protein, low fat meals more frequently and exercising 30 mins a day 5 times a week with a goal of 10-15lb weight loss in the next 3 months. Patient voiced their understanding and motivation to adhere to these recommendations.

## 2024-02-06 NOTE — Assessment & Plan Note (Signed)
 Chronic, ongoing. Mainly to lower back and hips. She is following with pain management, continue this collaboration. She would like oral pain meds in future to help pain at night, Tramadol  she reports has never worked. Continue to collaborate with pain management and orthopedics.

## 2024-02-06 NOTE — Assessment & Plan Note (Signed)
 Chronic, ongoing.  Continue weekly Vitamin D , refills sent up to date.  Check Vit D level today and recommend she obtain DEXA as ordered last visit.

## 2024-02-06 NOTE — Assessment & Plan Note (Signed)
 Chronic, ongoing.  Continue collaboration with vascular.  At this time continue Torsemide as ordered by previous provider, although limited evidence for this being beneficial in lymphedema. Recommend continue compression wraps at home and take Tylenol 1000 MG TID as needed.

## 2024-02-06 NOTE — Assessment & Plan Note (Signed)
 Chronic, stable.  BP at goal in office today.  Recommend she monitor BP at least a few mornings a week at home and document.  DASH diet at home.  Continue current medication regimen and adjust as needed.  Labs today: CMP and lipid.

## 2024-02-06 NOTE — Assessment & Plan Note (Signed)
 Chronic, ongoing.  Continue current medication regimen and adjust as needed.  Lipid panel and CMP today.

## 2024-02-07 LAB — CBC WITH DIFFERENTIAL/PLATELET
Basophils Absolute: 0 x10E3/uL (ref 0.0–0.2)
Basos: 1 %
EOS (ABSOLUTE): 0.1 x10E3/uL (ref 0.0–0.4)
Eos: 2 %
Hematocrit: 44.5 % (ref 34.0–46.6)
Hemoglobin: 14.3 g/dL (ref 11.1–15.9)
Immature Grans (Abs): 0 x10E3/uL (ref 0.0–0.1)
Immature Granulocytes: 0 %
Lymphocytes Absolute: 1.4 x10E3/uL (ref 0.7–3.1)
Lymphs: 23 %
MCH: 29.3 pg (ref 26.6–33.0)
MCHC: 32.1 g/dL (ref 31.5–35.7)
MCV: 91 fL (ref 79–97)
Monocytes Absolute: 0.4 x10E3/uL (ref 0.1–0.9)
Monocytes: 6 %
Neutrophils Absolute: 4.3 x10E3/uL (ref 1.4–7.0)
Neutrophils: 68 %
Platelets: 231 x10E3/uL (ref 150–450)
RBC: 4.88 x10E6/uL (ref 3.77–5.28)
RDW: 13.2 % (ref 11.7–15.4)
WBC: 6.3 x10E3/uL (ref 3.4–10.8)

## 2024-02-07 LAB — COMPREHENSIVE METABOLIC PANEL WITH GFR
ALT: 13 IU/L (ref 0–32)
AST: 14 IU/L (ref 0–40)
Albumin: 4.1 g/dL (ref 3.8–4.8)
Alkaline Phosphatase: 165 IU/L — ABNORMAL HIGH (ref 44–121)
BUN/Creatinine Ratio: 29 — ABNORMAL HIGH (ref 12–28)
BUN: 18 mg/dL (ref 8–27)
Bilirubin Total: 0.7 mg/dL (ref 0.0–1.2)
CO2: 26 mmol/L (ref 20–29)
Calcium: 9.3 mg/dL (ref 8.7–10.3)
Chloride: 97 mmol/L (ref 96–106)
Creatinine, Ser: 0.63 mg/dL (ref 0.57–1.00)
Globulin, Total: 2.5 g/dL (ref 1.5–4.5)
Glucose: 123 mg/dL — ABNORMAL HIGH (ref 70–99)
Potassium: 3.3 mmol/L — ABNORMAL LOW (ref 3.5–5.2)
Sodium: 143 mmol/L (ref 134–144)
Total Protein: 6.6 g/dL (ref 6.0–8.5)
eGFR: 94 mL/min/1.73 (ref >59)

## 2024-02-07 LAB — LIPID PANEL W/O CHOL/HDL RATIO
Cholesterol, Total: 160 mg/dL (ref 100–199)
HDL: 51 mg/dL (ref >39)
LDL Chol Calc (NIH): 86 mg/dL (ref 0–99)
Triglycerides: 130 mg/dL (ref 0–149)
VLDL Cholesterol Cal: 23 mg/dL (ref 5–40)

## 2024-02-07 LAB — TSH: TSH: 2.17 u[IU]/mL (ref 0.450–4.500)

## 2024-02-07 LAB — VITAMIN D 25 HYDROXY (VIT D DEFICIENCY, FRACTURES): Vit D, 25-Hydroxy: 23.5 ng/mL — ABNORMAL LOW (ref 30.0–100.0)

## 2024-02-07 LAB — T4, FREE: Free T4: 1.43 ng/dL (ref 0.82–1.77)

## 2024-02-07 NOTE — Progress Notes (Signed)
 Contacted via MyChart -- lab only visit in 2 weeks please  Good morning Michelle Marks, your labs have returned: - Kidney function, creatinine and eGFR, remains normal, as is liver function, AST and ALT.  - Potassium level is a little low, I would like you to increase potassium rich foods in diet like bananas, mangoes, dried fruit, orange juice, raisin bran. I would like to recheck this outpatient in 2 weeks to ensure it trends back up. - Vitamin D  remains a little low, please ensure to take Vitamin D3 supplement weekly as ordered. - Remainder of labs stable with no medication changes needed. Any questions? Keep being amazing!!  Thank you for allowing me to participate in your care.  I appreciate you. Kindest regards, Shakerria Parran

## 2024-02-09 ENCOUNTER — Telehealth: Payer: Self-pay

## 2024-02-09 NOTE — Telephone Encounter (Unsigned)
 Copied from CRM [id number]. Topic: Clinical - Medical Advice >> Feb 09, 2024 12:34 PM Tysheama G wrote: Reason for CRM: Patient stated she wants to know what the Dr suggested she do for her diabetes. She never said in MyChart, would like a nurse to call her [phone]

## 2024-02-10 NOTE — Telephone Encounter (Signed)
 Called and notified patient of Jolene's message. Patient states she is going to think about the medication and will let us  know if she wants to start the Ozempic.

## 2024-02-11 ENCOUNTER — Other Ambulatory Visit: Payer: Self-pay

## 2024-02-11 NOTE — Patient Outreach (Unsigned)
 Complex Care Management   Visit Note  02/11/2024  Name:  Michelle Marks MRN: [id number] DOB: [birth year]  Situation: Referral received for Complex Care Management related to Diabetes and Hypertension. I obtained verbal consent from Patient.  Visit completed with [name] via telephone.  Background:   Past Medical History:  Diagnosis Date   Anxiety    Arthritis    Difficult intubation    Edema, leg    Endometrial polyp    s/p Hysteroscopy D&C with polypectomy in 11/2016   GERD (gastroesophageal reflux disease)    Hyperlipidemia    Morbid obesity (HCC) 10/24/2016    Assessment: Patient Reported Symptoms:  Cognitive        Neurological      HEENT        Cardiovascular      Respiratory      Endocrine      Gastrointestinal        Genitourinary      Integumentary      Musculoskeletal          Psychosocial       Quality of Family Relationships: supportive Do you feel physically threatened by others?: No    02/11/2024    PHQ2-9 Depression Screening   Little interest or pleasure in doing things    Feeling down, depressed, or hopeless    PHQ-2 - Total Score    Trouble falling or staying asleep, or sleeping too much    Feeling tired or having little energy    Poor appetite or overeating     Feeling bad about yourself - or that you are a failure or have let yourself or your family down    Trouble concentrating on things, such as reading the newspaper or watching television    Moving or speaking so slowly that other people could have noticed.  Or the opposite - being so fidgety or restless that you have been moving around a lot more than usual    Thoughts that you would be better off dead, or hurting yourself in some way    PHQ2-9 Total Score    If you checked off any problems, how difficult have these problems made it for you to do your work, take care of things at home, or get along with other people    Depression Interventions/Treatment      There were  no vitals filed for this visit.  Medications Reviewed Today     Reviewed by Karoline Lima, RN (Registered Nurse) on 02/11/24 at 1619  Med List Status: <None>   Medication Order Taking? Sig Documenting Provider Last Dose Status Informant  Accu-Chek FastClix Lancets MISC [id number]  1 each by Does not apply route daily before breakfast. Cannady, Jolene T, NP  Active   atorvastatin  (LIPITOR) 40 MG tablet [id number]  Take 1 tablet (40 mg total) by mouth daily. Cannady, Jolene T, NP  Active   augmented betamethasone  dipropionate (DIPROLENE -AF) 0.05 % cream [id number]  Apply 1 Application topically 2 (two) times daily as needed (to affected skin areas). Cannady, Jolene T, NP  Active   Blood Glucose Monitoring Suppl (ACCU-CHEK GUIDE) w/Device KIT [id number]  To check blood sugar twice a day with goal fasting in morning <130 and goal 2 hours after meal <180.  Write all checks down for provider visits. Cannady, Jolene T, NP  Active   ergocalciferol  (VITAMIN D2) 1.25 MG (50000 UT) capsule [id number]  Take 1 capsule (50,000 Units total) by mouth once a week. Cannady, Jolene T,  NP  Active   esomeprazole  (NEXIUM ) 40 MG capsule [id number]  Take 1 capsule (40 mg total) by mouth daily. Valerio Moris T, NP  Active   glucose blood test strip [id number]  To check blood sugar daily prior to eating in morning, with goal less the 130. Cannady, Jolene T, NP  Active   levothyroxine  (SYNTHROID ) 75 MCG tablet [id number]  Take 1 tablet (75 mcg total) by mouth daily before breakfast. Cannady, Jolene T, NP  Active   torsemide  (DEMADEX ) 100 MG tablet [id number]  Take 1 tablet (100 mg total) by mouth daily. Valerio Moris DASEN, NP  Active   Med List Note Scott Norris, RN 09/22/23 1350): UDS 09/22/23            Recommendation:   {RECOMMENDATONS:32554}  Follow Up Plan:   {FOLLOWUP:32559}  SIG ***

## 2024-02-13 NOTE — Patient Instructions (Signed)
 Thank you for allowing the Complex Care Management team to participate in your care. It was great speaking with you!  We will follow up on March 16, 2024 at 4 pm. Please do not hesitate to contact me if you require assistance prior to our next outreach.   Jackson Acron [facility] RN Care Manager Direct Dial: [phone]  Fax: [phone] Website: [email or web address]

## 2024-02-21 ENCOUNTER — Other Ambulatory Visit: Payer: Self-pay | Admitting: Nurse Practitioner

## 2024-02-23 ENCOUNTER — Other Ambulatory Visit: Payer: Self-pay | Admitting: Nurse Practitioner

## 2024-02-23 NOTE — Telephone Encounter (Signed)
 FYI Only or Action Required?: Action required by provider: medication refill request.  Patient was last seen in primary care on 02/06/2024 by Cannady, Jolene T, NP.  Called Nurse Triage reporting No chief complaint on file..  Symptoms began today.  Interventions attempted: Nothing.  Symptoms are: stable.  Triage Disposition: No disposition on file.  Patient/caregiver understands and will follow disposition?:      Copied from CRM [id number]. Topic: Clinical - Prescription Issue >> Feb 23, 2024  3:40 PM Tobias L wrote: Reason for CRM: Patient states she received message from [facility] stating her levothyroxine  has been denied. [facility] had messaged her to refill prescription and patient agreed. Advised refill requested to soon, last filled 07/30/23 #90 2 RF.   Patient states she did not pick up Rx in August but is getting low on it and needing refill.   Requesting refill to be sent in, [phone]

## 2024-02-25 ENCOUNTER — Telehealth: Payer: Self-pay

## 2024-03-01 ENCOUNTER — Encounter: Payer: Self-pay | Admitting: Obstetrics & Gynecology

## 2024-03-01 ENCOUNTER — Ambulatory Visit: Admitting: Obstetrics & Gynecology

## 2024-03-01 VITALS — BP 130/80 | HR 73 | Wt 278.0 lb

## 2024-03-01 DIAGNOSIS — R9389 Abnormal findings on diagnostic imaging of other specified body structures: Secondary | ICD-10-CM

## 2024-03-01 DIAGNOSIS — N950 Postmenopausal bleeding: Secondary | ICD-10-CM | POA: Diagnosis not present

## 2024-03-01 DIAGNOSIS — Z6841 Body Mass Index (BMI) 40.0 and over, adult: Secondary | ICD-10-CM | POA: Diagnosis not present

## 2024-03-01 NOTE — Progress Notes (Signed)
    GYNECOLOGY PROGRESS NOTE  Subjective:    Patient ID: Michelle Marks, female    DOB: Mar 19, 1951, 73 y.o.   MRN: [id number]  HPI  Patient is a 73 y.o. G1P1001 here with another occasion of PMB, about 2 months ago. She had MRI of her hip 10/2023 and herr endometrium was noted to be 15mm. In 2018 she had a d&c with Dr. Connell for PMB and her pathology showed uterine polyps.   The following portions of the patient's history were reviewed and updated as appropriate: allergies, current medications, past family history, past medical history, past social history, past surgical history, and problem list.  Review of Systems Pertinent items are noted in HPI.   Objective:   Blood pressure 130/80, pulse 73, weight 278 lb (126.1 kg). Body mass index is 47.87 kg/m. Well nourished, well hydrated White female, no apparent distress She is conversing normally. She is wheelchair bound. With extensive maneuvering and placing the speculum upside down with her thighs almost touching, I was able to place a [employer] and I did not see any cervical polyps.    Assessment:   1. PMB (postmenopausal bleeding)   2. Morbid obesity with body mass index (BMI) of 45.0 to 49.9 in adult [facility])      Plan:   1. PMB (postmenopausal bleeding) (Primary) With thickened endometrium  2. Morbid obesity with body mass index (BMI) of 45.0 to 49.9 in adult [facility])  Based on her limited mobility and my suspicion of endometrial cancer,  I think she would be better served by a consult with gyn onc

## 2024-03-10 ENCOUNTER — Inpatient Hospital Stay

## 2024-03-12 ENCOUNTER — Other Ambulatory Visit

## 2024-03-12 NOTE — Patient Outreach (Signed)
 Complex Care Management   Visit Note  03/12/2024  Name:  Michelle Marks MRN: [id number] DOB: [birth year]  Situation: Referral received for Complex Care Management related to Diabetes I obtained verbal consent from Patient.  Visit completed with [name] via telephone.  Background:   Past Medical History:  Diagnosis Date   Anxiety    Arthritis    Difficult intubation    Edema, leg    Endometrial polyp    s/p Hysteroscopy D&C with polypectomy in 11/2016   GERD (gastroesophageal reflux disease)    Hyperlipidemia    Morbid obesity (HCC) 10/24/2016    Assessment: Patient Reported Symptoms: Cognitive Cognitive Status: Alert and oriented to person, place, and time, Normal speech and language skills Cognitive/Intellectual Conditions Management [RPT]: None reported or documented in medical history or problem list Health Maintenance Behaviors: Annual physical exam Healing Pattern: Slow Health Facilitated by: Rest, Stress management  Neurological Neurological Review of Symptoms: Other: [place]her Neurological Symptoms/Conditions [RPT]: Reports chronic weakness. Denies acute changes Neurological Management Strategies: Adequate rest, Medical device  HEENT HEENT Symptoms Reported: No symptoms reported HEENT Management Strategies: Medical device, Routine screening, Coping strategies HEENT Self-Management Outcome: 4 (good)  Cardiovascular Cardiovascular Symptoms Reported: Swelling in legs or feet Other Cardiovascular Symptoms: Reporst chronic swelling to lower extremities secondary to lymphedema. Does patient have uncontrolled Hypertension?: No Cardiovascular Management Strategies: Medical device, Medication therapy, Routine screening, Coping strategies Cardiovascular Self-Management Outcome: 4 (good)  Respiratory Respiratory Symptoms Reported: No symptoms reported Respiratory Self-Management Outcome: 4 (good)  Endocrine Endocrine Symptoms Reported: No symptoms reported Is patient diabetic?:  Yes Is patient checking blood sugars at home?: Yes List most recent blood sugar readings, include date and time of day: Did not have log at time of call. A1C is below 7% however PCP has recommended options for optimal glycemic control. Patient currently declines. Agreed to update PCP if she opts to start medication. Endocrine Self-Management Outcome: 4 (good)  Gastrointestinal Gastrointestinal Symptoms Reported: No symptoms reported Gastrointestinal Self-Management Outcome: 4 (good)  Genitourinary Genitourinary Symptoms Reported: Other Other Genitourinary Symptoms: Reports pending follow up with the Gynecology team regarding concerns for uterine thickening Genitourinary Self-Management Outcome: 4 (good)  Integumentary Integumentary Symptoms Reported: No symptoms reported Skin Self-Management Outcome: 4 (good)  Musculoskeletal Musculoskelatal Symptoms Reviewed: Difficulty walking Additional Musculoskeletal Details: Mobility limited secondary to chronic lymphedema. Requires walker with ambulation Musculoskeletal Self-Management Outcome: 4 (good)  Psychosocial Psychosocial Symptoms Reported: No symptoms reported Quality of Family Relationships: supportive, involved, helpful Do you feel physically threatened by others?: No    03/15/2024    PHQ2-9 Depression Screening   Little interest or pleasure in doing things Not at all  Feeling down, depressed, or hopeless Not at all  PHQ-2 - Total Score 0    There were no vitals filed for this visit.  Medications Reviewed Today     Reviewed by Karoline Lima, RN (Registered Nurse) on 03/12/24 at 1515  Med List Status: <None>   Medication Order Taking? Sig Documenting Provider Last Dose Status Informant  Accu-Chek FastClix Lancets MISC [id number]  1 each by Does not apply route daily before breakfast. Cannady, Jolene T, NP  Active   atorvastatin  (LIPITOR) 40 MG tablet [id number]  Take 1 tablet (40 mg total) by mouth daily. Cannady, Jolene T, NP   Active   augmented betamethasone  dipropionate (DIPROLENE -AF) 0.05 % cream [id number]  Apply 1 Application topically 2 (two) times daily as needed (to affected skin areas). Cannady, Jolene T, NP  Active   Blood Glucose Monitoring Suppl (  ACCU-CHEK GUIDE) w/Device KIT [id number]  To check blood sugar twice a day with goal fasting in morning <130 and goal 2 hours after meal <180.  Write all checks down for provider visits. Cannady, Jolene T, NP  Active   ergocalciferol  (VITAMIN D2) 1.25 MG (50000 UT) capsule [id number]  Take 1 capsule (50,000 Units total) by mouth once a week. Cannady, Jolene T, NP  Active   esomeprazole  (NEXIUM ) 40 MG capsule [id number]  Take 1 capsule (40 mg total) by mouth daily. Valerio Moris T, NP  Active   glucose blood test strip [id number]  To check blood sugar daily prior to eating in morning, with goal less the 130. Cannady, Jolene T, NP  Active   levothyroxine  (SYNTHROID ) 75 MCG tablet [id number]  Take 1 tablet (75 mcg total) by mouth daily before breakfast. Cannady, Jolene T, NP  Active   torsemide  (DEMADEX ) 100 MG tablet [id number]  Take 1 tablet (100 mg total) by mouth daily. Valerio Moris DASEN, NP  Active   Med List Note Scott Norris, RN 09/22/23 1350): UDS 09/22/23            Recommendation:   Continue Current Plan of Care  Follow Up Plan:   Patient has met all care management goals. Patient has been provided contact information should new needs arise.    Jackson Acron [facility] Population [facility] RN Care Manager Direct Dial: [phone]  Fax: [phone] Website: [email or web address]   ]

## 2024-03-15 NOTE — Patient Instructions (Addendum)
 Thank you for allowing the Complex Care Management team to participate in your care. It was great speaking with you!   Reminders: Please be sure to continue monitoring your blood sugar readings and maintain a log. Please be sure to notify Jolene if you opt to start medications for optimal glycemic control.   Jackson Acron [facility] Population [facility] RN Care Manager Direct Dial: [phone]  Fax: [phone] Website: [email or web address]

## 2024-03-16 ENCOUNTER — Telehealth

## 2024-03-17 ENCOUNTER — Encounter: Payer: Self-pay | Admitting: Obstetrics and Gynecology

## 2024-03-17 ENCOUNTER — Inpatient Hospital Stay: Attending: Obstetrics and Gynecology | Admitting: Obstetrics and Gynecology

## 2024-03-17 ENCOUNTER — Inpatient Hospital Stay

## 2024-03-17 ENCOUNTER — Other Ambulatory Visit: Payer: Self-pay | Admitting: Nurse Practitioner

## 2024-03-17 VITALS — BP 135/72 | HR 79 | Temp 98.6°F | Resp 19 | Wt 278.0 lb

## 2024-03-17 DIAGNOSIS — K219 Gastro-esophageal reflux disease without esophagitis: Secondary | ICD-10-CM | POA: Insufficient documentation

## 2024-03-17 DIAGNOSIS — R9389 Abnormal findings on diagnostic imaging of other specified body structures: Secondary | ICD-10-CM | POA: Insufficient documentation

## 2024-03-17 DIAGNOSIS — N840 Polyp of corpus uteri: Secondary | ICD-10-CM | POA: Insufficient documentation

## 2024-03-17 DIAGNOSIS — Z800 Family history of malignant neoplasm of digestive organs: Secondary | ICD-10-CM | POA: Insufficient documentation

## 2024-03-17 DIAGNOSIS — Z809 Family history of malignant neoplasm, unspecified: Secondary | ICD-10-CM | POA: Insufficient documentation

## 2024-03-17 DIAGNOSIS — Z79899 Other long term (current) drug therapy: Secondary | ICD-10-CM | POA: Insufficient documentation

## 2024-03-17 DIAGNOSIS — F419 Anxiety disorder, unspecified: Secondary | ICD-10-CM | POA: Diagnosis not present

## 2024-03-17 DIAGNOSIS — R87610 Atypical squamous cells of undetermined significance on cytologic smear of cervix (ASC-US): Secondary | ICD-10-CM | POA: Diagnosis not present

## 2024-03-17 DIAGNOSIS — N8501 Benign endometrial hyperplasia: Secondary | ICD-10-CM | POA: Insufficient documentation

## 2024-03-17 DIAGNOSIS — Z6841 Body Mass Index (BMI) 40.0 and over, adult: Secondary | ICD-10-CM | POA: Insufficient documentation

## 2024-03-17 DIAGNOSIS — M1611 Unilateral primary osteoarthritis, right hip: Secondary | ICD-10-CM | POA: Diagnosis not present

## 2024-03-17 DIAGNOSIS — Z993 Dependence on wheelchair: Secondary | ICD-10-CM | POA: Insufficient documentation

## 2024-03-17 DIAGNOSIS — Z7189 Other specified counseling: Secondary | ICD-10-CM

## 2024-03-17 DIAGNOSIS — M1711 Unilateral primary osteoarthritis, right knee: Secondary | ICD-10-CM | POA: Insufficient documentation

## 2024-03-17 DIAGNOSIS — Z79890 Hormone replacement therapy (postmenopausal): Secondary | ICD-10-CM | POA: Insufficient documentation

## 2024-03-17 DIAGNOSIS — Z87891 Personal history of nicotine dependence: Secondary | ICD-10-CM | POA: Insufficient documentation

## 2024-03-17 DIAGNOSIS — N950 Postmenopausal bleeding: Secondary | ICD-10-CM | POA: Insufficient documentation

## 2024-03-17 NOTE — Progress Notes (Signed)
 Patient was seen in procedure room 1 of the [facility] GYN ONC Department., where I assisted with pelvic exam. I, Rayfield Jasmine RN, was present as chaperone for the sensitive portion of the exam.

## 2024-03-17 NOTE — Progress Notes (Signed)
 Gynecologic Oncology Consult Visit   Referring Provider: Harland JAYSON Birkenhead, MD   Chief Concern: postmenopausal bleeding and thickened endometrium   Subjective:  Michelle Marks is a 73 y.o. female who is seen in consultation from Dr. Birkenhead for postmenopausal bleeding and thickened endometrium.   Dr. Birkenhead unable to do an exam in clinic. The patient is wheelchair bound.  Pelvic US  has not been ordered.  10/30/2016 Pap NILM; HPV not performed  10/30/2016 She had a prior pelvic US    Findings:  The uterus is anteverted and measures 9.8 x 5.9 x 7.0 cm, slightly enlarged for age. Echo texture is homogenous without evidence of focal masses.   The Endometrium is thickened and measures 26.4 mm.   Right and left ovaries were not visualized. Survey of the adnexa demonstrates no adnexal masses. There is no free fluid in the cul de sac.   * Exam limited by patient body habitus*   Impression: 1. Thickened endometrium in a post menopausal patient.    Recommendations: 1.Clinical correlation with the patient's History and Physical Exam. 2. Patient to follow up with Dr. Connell for possible endometrial biopsy.  11/07/2026  ENDOMETRIUM, BIOPSY:  POLYPOID FRAGMENTS OF PROLIFERATIVE TYPE ENDOMETRIUM SUGGESTIVE  OF BENIGN ENDOMETRIAL POLYPS. STRIPS OF BENIGN SURFACE  ENDOMETRIAL EPITHELIUM. NO HYPERPLASIA OR CARCINOMA.  OOD/11/08/2016   6/18/20218 Dr. Connell did a Hysteroscopy D&C with polypectomy  A. ENDOMETRIAL CURETTINGS AND MULTIPLE POLYPS:  - ENDOMETRIAL POLYPS WITH FOCAL HYPERPLASIA; NEGATIVE FOR ATYPIA.  - INACTIVE BACKGROUND ENDOMETRIUM.  - ACUTELY INFLAMED ENDOCERVICAL GLANDULAR EPITHELIUM.  - NEGATIVE FOR MALIGNANCY.   B. ENDOMETRIAL POLYP:  - ENDOMETRIAL POLYP.  - NEGATIVE FOR ATYPIA AND MALIGNANCY.    11/04/2023 HIP MRI Incidental finding of abnormally thickened and heterogeneous endometrial stripe measuring approximately 15 mm. Recommend further evaluation with pelvic  ultrasound.  Dr. Birkenhead attempted endometrial biopsy however due to habitus unable to perform in clinic.   Last Pap unknown.    Problem List: Patient Active Problem List   Diagnosis Date Noted   Low back pain of over 3 months duration 10/28/2023   Mechanical low back pain 10/28/2023   Multifactorial low back pain 10/28/2023   Osteoarthritis of hip (Right) 10/13/2023   Osteoarthritis of knee (Right) 10/13/2023   Elevated sed rate 10/13/2023   Chronic hip pain (Right) 10/13/2023   Question of aseptic necrosis of bone of hip (Right) (HCC) 10/13/2023    Class: Question of   Pain aggravated by anxiety 10/13/2023   Lumbar facet joint pain 10/13/2023   Lumbar facet joint arthropathy 10/13/2023   Chronic low back pain (1ry area of Pain) (Bilateral) w/o sciatica 09/22/2023   Chronic lower extremity pain (2ry area of Pain) (Right) 09/22/2023   Chronic knee pain (3ry area of Pain) (Right) 09/22/2023   Chronic pain syndrome 09/21/2023   Pharmacologic therapy 09/21/2023   Disorder of skeletal system 09/21/2023   Problems influencing health status 09/21/2023   Type 2 diabetes mellitus with proteinuria (HCC) 09/25/2021   Type 2 diabetes mellitus with obesity 02/12/2021   Lymphedema in adult patient 02/12/2021   White coat syndrome without diagnosis of hypertension 02/12/2021   Hyperlipidemia associated with type 2 diabetes mellitus (HCC) 02/09/2021   Vitamin D  deficiency 02/09/2021   GERD without esophagitis 02/09/2021   Hypothyroidism 02/09/2021   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/2020   Psoriasis 08/21/2020   Rheumatoid factor positive 08/21/2020   Morbid obesity with body mass index (BMI) of 45.0 to 49.9 in  adult (HCC) 10/24/2016   Postmenopausal bleeding 10/24/2016    Past Medical History: Past Medical History:  Diagnosis Date   Anxiety    Arthritis    Difficult intubation    Edema, leg    Endometrial polyp    s/p Hysteroscopy D&C with polypectomy in  11/2016   GERD (gastroesophageal reflux disease)    Hyperlipidemia    Morbid obesity (HCC) 10/24/2016    Past Surgical History: Past Surgical History:  Procedure Laterality Date   BREAST CYST EXCISION Right age 42   benign   BREAST SURGERY Right    Cyst Removal   CERVICAL POLYPECTOMY N/A 12/02/2016   Procedure: CERVICAL POLYPECTOMY;  Surgeon: Connell Davies, MD;  Location: [facility] ORS;  Service: Gynecology;  Laterality: N/A;   DILATION AND CURETTAGE OF UTERUS     HYSTEROSCOPY     HYSTEROSCOPY WITH D & C N/A 12/02/2016   Procedure: DILATATION AND CURETTAGE /HYSTEROSCOPY;  Surgeon: Connell Davies, MD;  Location: [facility] ORS;  Service: Gynecology;  Laterality: N/A;   JOINT REPLACEMENT Left    Total Knee Replacement, Dr. Mardee, [facility]   KNEE ARTHROSCOPY Left    TONSILLECTOMY     as a child   As per HPI  OB History:  OB History  Gravida Para Term Preterm AB Living  1 1 1   1   SAB IAB Ectopic Multiple Live Births      1    # Outcome Date GA Lbr Len/2nd Weight Sex Type Anes PTL Lv  1 Term 53    M Vag-Spont   LIV    Family History: Family History  Problem Relation Age of Onset   Cancer Mother    Diabetes Mother    Hypertension Mother    Diabetes Father    Pancreatitis Father    Cancer Sister     Social History: Social History   Socioeconomic History   Marital status: Single    Spouse name: Not on file   Number of children: 1   Years of education: Not on file   Highest education level: Not on file  Occupational History   Not on file  Tobacco Use   Smoking status: Former    Current packs/day: 0.00    Average packs/day: 0.5 packs/day for 32.2 years (16.1 ttl pk-yrs)    Types: Cigarettes    Start date: 76    Quit date: 09/16/2003    Years since quitting: 20.5   Smokeless tobacco: Never  Vaping Use   Vaping status: Never Used  Substance and Sexual Activity   Alcohol use: Yes    Comment: 1-2 times per year   Drug use: No   Sexual activity: Not Currently    Birth  control/protection: None  Other Topics Concern   Not on file  Social History Narrative   Not on file   Social Drivers of Health   Financial Resource Strain: High Risk (01/27/2024)   Received from [facility]   Overall Financial Resource Strain (CARDIA)    Difficulty of Paying Living Expenses: Very hard  Food Insecurity: No Food Insecurity (03/17/2024)   Hunger Vital Sign    Worried About Running Out of Food in the Last Year: Never true    Ran Out of Food in the Last Year: Never true  Recent Concern: Food Insecurity - Food Insecurity Present (01/27/2024)   Received from [facility]   Hunger Vital Sign    Within the past 12 months, you worried that your  food would run out before you got the money to buy more.: Sometimes true    Within the past 12 months, the food you bought just didn't last and you didn't have money to get more.: Sometimes true  Transportation Needs: No Transportation Needs (03/17/2024)   PRAPARE - Administrator, Civil Service (Medical): No    Lack of Transportation (Non-Medical): No  Physical Activity: Inactive (10/16/2023)   Exercise Vital Sign    Days of Exercise per Week: 0 days    Minutes of Exercise per Session: 0 min  Stress: Stress Concern Present (07/31/2023)   [employer] of Occupational Health - Occupational Stress Questionnaire    Feeling of Stress : Rather much  Social Connections: Socially Isolated (10/16/2023)   Social Connection and Isolation Panel    Frequency of Communication with Friends and Family: More than three times a week    Frequency of Social Gatherings with Friends and Family: More than three times a week    Attends Religious Services: Never    Database administrator or Organizations: No    Attends Banker Meetings: Never    Marital Status: Never married  Intimate Partner Violence: Not At Risk (03/17/2024)   Humiliation, Afraid, Rape, and Kick questionnaire    Fear of Current  or Ex-Partner: No    Emotionally Abused: No    Physically Abused: No    Sexually Abused: No    Allergies: Allergies  Allergen Reactions   Sulfa Antibiotics Nausea Only    Current Medications: Current Outpatient Medications  Medication Sig Dispense Refill   Accu-Chek FastClix Lancets MISC 1 each by Does not apply route daily before breakfast. 100 each 3   atorvastatin  (LIPITOR) 40 MG tablet Take 1 tablet (40 mg total) by mouth daily. 90 tablet 3   augmented betamethasone  dipropionate (DIPROLENE -AF) 0.05 % cream Apply 1 Application topically 2 (two) times daily as needed (to affected skin areas). 50 g 4   Blood Glucose Monitoring Suppl (ACCU-CHEK GUIDE) w/Device KIT To check blood sugar twice a day with goal fasting in morning <130 and goal 2 hours after meal <180.  Write all checks down for provider visits. 1 kit 0   ergocalciferol  (VITAMIN D2) 1.25 MG (50000 UT) capsule Take 1 capsule (50,000 Units total) by mouth once a week. 12 capsule 4   esomeprazole  (NEXIUM ) 40 MG capsule Take 1 capsule (40 mg total) by mouth daily. 90 capsule 2   glucose blood test strip To check blood sugar daily prior to eating in morning, with goal less the 130. 100 each 12   levothyroxine  (SYNTHROID ) 75 MCG tablet Take 1 tablet (75 mcg total) by mouth daily before breakfast. 90 tablet 2   torsemide  (DEMADEX ) 100 MG tablet Take 1 tablet (100 mg total) by mouth daily. 90 tablet 2   No current facility-administered medications for this visit.    Review of Systems General: negative for fevers, changes in weight or night sweats Skin: negative for changes in moles or sores or rash Eyes: negative for changes in vision HEENT: negative for change in hearing, tinnitus, voice changes Pulmonary: negative for dyspnea, orthopnea, productive cough, wheezing Cardiac: negative for palpitations, pain Gastrointestinal: negative for nausea, vomiting, constipation, diarrhea, hematemesis, hematochezia Genitourinary/Sexual:  incontinence Ob/Gyn:  one episode of abnormal bleeding Musculoskeletal: leg pain with walking, joint pain, back pain Hematology: negative for easy bruising, abnormal bleeding Neurologic/Psych: negative for headaches, seizures, paralysis, weakness, numbness   Objective:  Physical Examination:  BP 135/72  Pulse 79   Temp 98.6 F (37 C)   Resp 19   Wt 278 lb (126.1 kg) Comment: Patient verbilized weight. states she is unable  SpO2 98%   BMI 47.87 kg/m    ECOG Performance Status: 2 - Symptomatic with joint pain; uses a wheechair  GENERAL: No acute distress HEENT:  atraumatic normocephalic NODES:  No  inguinal lymphadenopathy palpated, but limited exam  LUNGS:  normal respiratory effort ABDOMEN:  Protuberant. Limited exam due to habitus.  EXTREMITIES:  bilateral lymphedema with compression wraps NEURO:  Nonfocal. Well oriented.  Appropriate affect.  Pelvic: required four team members. Chaperoned by NP EGBUS: erythema purplish, swollen. No gross lesion Cervix: no lesions, nontender. Long at least 4 cm.  Vagina: no lesions, no discharge or bleeding Uterus: unable to determine size Adnexa: unable to assess due to habitus   EMBx Procedure  The risks and benefits of the procedure were reviewed and informed consent obtained. Time out was performed. The patient received pre-procedure teaching and expressed understanding. The post-procedure instructions were reviewed with the patient and she expressed understanding. The patient does not have any barriers to learning.  Speculum placed in the vaginal vault and cervix identified. Cervix cleansed with Betadine. The vaginal was retracted with the ring forceps on the left to improved exposure. The os was entered with the pipelle was inserted to at least 12 cm. The specimen was obtained without difficult. Hemostasis was excellent.   Post-procedure evaluation the patient was stable without complaints.   Lab Review Labs on site  today: N/A  Radiologic Imaging: N/A    Assessment:  STATIA BURDICK is a 73 y.o. female diagnosed with postmenopausal bleeding and thickened endometrium.    Medical co-morbidities complicating care: Hypothyroidism, wheelchair bound, diabetes and Body mass index is 47.87 kg/m.   Plan:   Problem List Items Addressed This Visit       Other   Postmenopausal bleeding - Primary   Relevant Orders   IGP, Aptima HPV   Surgical pathology   Other Visit Diagnoses       Endometrial polyp       Relevant Orders   IGP, Aptima HPV   Surgical pathology     Thickened endometrium       Relevant Orders   IGP, Aptima HPV   Surgical pathology     Counseling and coordination of care           We discussed options for management and recommended follow up of endometrial biopsy to determine next steps.   Suggested return to clinic in  2-3 weeks.    The patient's diagnosis, an outline of the further diagnostic and laboratory studies which will be required, the recommendation, and alternatives were discussed.  All questions were answered to the patient's satisfaction.  A total of 60 minutes were spent with the patient/family today; >50% was spent in education, counseling and coordination of care for postmenopausal bleeding and thickened endometrium. Level of service based on medical decision making and counseling.   Anberlin Diez Isidor Constable, MD    CC:  Starla Harland BROCKS, MD [address] [place],  [place] [address] [phone]

## 2024-03-19 ENCOUNTER — Telehealth: Payer: Self-pay | Admitting: Obstetrics and Gynecology

## 2024-03-19 LAB — IGP, APTIMA HPV: HPV Aptima: NEGATIVE

## 2024-03-19 LAB — SURGICAL PATHOLOGY

## 2024-03-19 NOTE — Telephone Encounter (Signed)
 Pt called and wanted to know what time her GYN appt was for on 10/22.  She has her AVS and it says 2pm but she has mychart and it is showing 1pm.   The appt is 1pm and it is showing that yes, the original appt was 2pm but the time was changed.   Pt stated no one called her to update.  I have confirmed the 1pm appt time and pt states she will be there but she doesn't want to get here and then be told no its at 2pm  I reassured pt that the appt is 1pm now and she will be good to check in at that time.

## 2024-03-29 ENCOUNTER — Telehealth

## 2024-04-07 ENCOUNTER — Inpatient Hospital Stay: Admitting: Obstetrics and Gynecology

## 2024-04-07 ENCOUNTER — Encounter: Payer: Self-pay | Admitting: Obstetrics and Gynecology

## 2024-04-07 VITALS — BP 135/65 | HR 79 | Temp 98.6°F | Resp 20 | Wt 279.9 lb

## 2024-04-07 DIAGNOSIS — R87610 Atypical squamous cells of undetermined significance on cytologic smear of cervix (ASC-US): Secondary | ICD-10-CM | POA: Diagnosis not present

## 2024-04-07 DIAGNOSIS — N8501 Benign endometrial hyperplasia: Secondary | ICD-10-CM

## 2024-04-07 DIAGNOSIS — N950 Postmenopausal bleeding: Secondary | ICD-10-CM | POA: Diagnosis not present

## 2024-04-07 NOTE — Progress Notes (Signed)
 Gynecologic Oncology Interval Visit   Referring Provider: Harland JAYSON Birkenhead, MD   Chief Concern: postmenopausal bleeding and thickened endometrium   Subjective:  Michelle Marks is a 73 y.o. female who is seen in consultation from Dr. Birkenhead for postmenopausal bleeding and thickened endometrium.   She presents today to discuss her biopsy result noted below.       -  DISORDERED PROLIFERATIVE ENDOMETRIUM WITH FOCAL COMPLEX HYPERPLASIA WITHOUT ATYPIA.       -  NEGATIVE FOR ENDOMETRIAL INTRAEPITHELIAL NEOPLASIA AND CARCINOMA ON SECTIONS EXAMINED.   Pap: ASCUS/HRHPV negative  Gynecologic History  Michelle Marks is a pleasant female who is seen in consultation from Dr. Birkenhead for postmenopausal bleeding and thickened endometrium.   Dr. Birkenhead unable to do an exam in clinic. The patient is wheelchair bound.  Pelvic US  has not been ordered.  10/30/2016 Pap NILM; HPV not performed  10/30/2016 She had a prior pelvic US    Findings:  The uterus is anteverted and measures 9.8 x 5.9 x 7.0 cm, slightly enlarged for age. Echo texture is homogenous without evidence of focal masses.   The Endometrium is thickened and measures 26.4 mm.   Right and left ovaries were not visualized. Survey of the adnexa demonstrates no adnexal masses. There is no free fluid in the cul de sac.   * Exam limited by patient body habitus*   Impression: 1. Thickened endometrium in a post menopausal patient.    Recommendations: 1.Clinical correlation with the patient's History and Physical Exam. 2. Patient to follow up with Dr. Connell for possible endometrial biopsy.  11/07/2026  ENDOMETRIUM, BIOPSY:  POLYPOID FRAGMENTS OF PROLIFERATIVE TYPE ENDOMETRIUM SUGGESTIVE  OF BENIGN ENDOMETRIAL POLYPS. STRIPS OF BENIGN SURFACE  ENDOMETRIAL EPITHELIUM. NO HYPERPLASIA OR CARCINOMA.  OOD/11/08/2016   6/18/20218 Dr. Connell did a Hysteroscopy D&C with polypectomy  A. ENDOMETRIAL CURETTINGS AND MULTIPLE POLYPS:  - ENDOMETRIAL POLYPS  WITH FOCAL HYPERPLASIA; NEGATIVE FOR ATYPIA.  - INACTIVE BACKGROUND ENDOMETRIUM.  - ACUTELY INFLAMED ENDOCERVICAL GLANDULAR EPITHELIUM.  - NEGATIVE FOR MALIGNANCY.   B. ENDOMETRIAL POLYP:  - ENDOMETRIAL POLYP.  - NEGATIVE FOR ATYPIA AND MALIGNANCY.    11/04/2023 HIP MRI Incidental finding of abnormally thickened and heterogeneous endometrial stripe measuring approximately 15 mm. Recommend further evaluation with pelvic ultrasound.  Dr. Birkenhead attempted endometrial biopsy however due to habitus unable to perform in clinic.      Problem List: Patient Active Problem List   Diagnosis Date Noted   Complex endometrial hyperplasia without atypia 04/07/2024   Low back pain of over 3 months duration 10/28/2023   Mechanical low back pain 10/28/2023   Multifactorial low back pain 10/28/2023   Osteoarthritis of hip (Right) 10/13/2023   Osteoarthritis of knee (Right) 10/13/2023   Elevated sed rate 10/13/2023   Chronic hip pain (Right) 10/13/2023   Question of aseptic necrosis of bone of hip (Right) (HCC) 10/13/2023    Class: Question of   Pain aggravated by anxiety 10/13/2023   Lumbar facet joint pain 10/13/2023   Lumbar facet joint arthropathy 10/13/2023   Chronic low back pain (1ry area of Pain) (Bilateral) w/o sciatica 09/22/2023   Chronic lower extremity pain (2ry area of Pain) (Right) 09/22/2023   Chronic knee pain (3ry area of Pain) (Right) 09/22/2023   Chronic pain syndrome 09/21/2023   Pharmacologic therapy 09/21/2023   Disorder of skeletal system 09/21/2023   Problems influencing health status 09/21/2023   Type 2 diabetes mellitus with proteinuria (HCC) 09/25/2021   Type 2 diabetes mellitus  with obesity 02/12/2021   Lymphedema in adult patient 02/12/2021   White coat syndrome without diagnosis of hypertension 02/12/2021   Hyperlipidemia associated with type 2 diabetes mellitus (HCC) 02/09/2021   Vitamin D  deficiency 02/09/2021   GERD without esophagitis 02/09/2021    Hypothyroidism 02/09/2021   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/2020   Psoriasis 08/21/2020   Rheumatoid factor positive 08/21/2020   Morbid obesity with body mass index (BMI) of 45.0 to 49.9 in adult [facility]) 10/24/2016   Postmenopausal bleeding 10/24/2016    Past Medical History: Past Medical History:  Diagnosis Date   Anxiety    Arthritis    Difficult intubation    Edema, leg    Endometrial polyp    s/p Hysteroscopy D&C with polypectomy in 11/2016   GERD (gastroesophageal reflux disease)    Hyperlipidemia    Morbid obesity (HCC) 10/24/2016    Past Surgical History: Past Surgical History:  Procedure Laterality Date   BREAST CYST EXCISION Right age 46   benign   BREAST SURGERY Right    Cyst Removal   CERVICAL POLYPECTOMY N/A 12/02/2016   Procedure: CERVICAL POLYPECTOMY;  Surgeon: Connell Davies, MD;  Location: [facility] ORS;  Service: Gynecology;  Laterality: N/A;   DILATION AND CURETTAGE OF UTERUS     HYSTEROSCOPY     HYSTEROSCOPY WITH D & C N/A 12/02/2016   Procedure: DILATATION AND CURETTAGE /HYSTEROSCOPY;  Surgeon: Connell Davies, MD;  Location: [facility] ORS;  Service: Gynecology;  Laterality: N/A;   JOINT REPLACEMENT Left    Total Knee Replacement, Dr. Mardee, [facility]   KNEE ARTHROSCOPY Left    TONSILLECTOMY     as a child   As per HPI  OB History:  OB History  Gravida Para Term Preterm AB Living  1 1 1   1   SAB IAB Ectopic Multiple Live Births      1    # Outcome Date GA Lbr Len/2nd Weight Sex Type Anes PTL Lv  1 Term 36    M Vag-Spont   LIV    Family History: Family History  Problem Relation Age of Onset   Cancer Mother    Diabetes Mother    Hypertension Mother    Diabetes Father    Pancreatitis Father    Cancer Sister     Social History: Social History   Socioeconomic History   Marital status: Single    Spouse name: Not on file   Number of children: 1   Years of education: Not on file   Highest education level: Not on file   Occupational History   Not on file  Tobacco Use   Smoking status: Former    Current packs/day: 0.00    Average packs/day: 0.5 packs/day for 32.2 years (16.1 ttl pk-yrs)    Types: Cigarettes    Start date: 98    Quit date: 09/16/2003    Years since quitting: 20.5   Smokeless tobacco: Never  Vaping Use   Vaping status: Never Used  Substance and Sexual Activity   Alcohol use: Yes    Comment: 1-2 times per year   Drug use: No   Sexual activity: Not Currently    Birth control/protection: None  Other Topics Concern   Not on file  Social History Narrative   Not on file   Social Drivers of Health   Financial Resource Strain: High Risk (01/27/2024)   Received from [facility]   Overall Financial Resource Strain (CARDIA)  Difficulty of Paying Living Expenses: Very hard  Food Insecurity: No Food Insecurity (03/17/2024)   Hunger Vital Sign    Worried About Running Out of Food in the Last Year: Never true    Ran Out of Food in the Last Year: Never true  Recent Concern: Food Insecurity - Food Insecurity Present (01/27/2024)   Received from [facility]   Hunger Vital Sign    Within the past 12 months, you worried that your food would run out before you got the money to buy more.: Sometimes true    Within the past 12 months, the food you bought just didn't last and you didn't have money to get more.: Sometimes true  Transportation Needs: No Transportation Needs (03/17/2024)   PRAPARE - Administrator, Civil Service (Medical): No    Lack of Transportation (Non-Medical): No  Physical Activity: Inactive (10/16/2023)   Exercise Vital Sign    Days of Exercise per Week: 0 days    Minutes of Exercise per Session: 0 min  Stress: Stress Concern Present (07/31/2023)   [employer] of Occupational Health - Occupational Stress Questionnaire    Feeling of Stress : Rather much  Social Connections: Socially Isolated (10/16/2023)   Social Connection  and Isolation Panel    Frequency of Communication with Friends and Family: More than three times a week    Frequency of Social Gatherings with Friends and Family: More than three times a week    Attends Religious Services: Never    Database administrator or Organizations: No    Attends Banker Meetings: Never    Marital Status: Never married  Intimate Partner Violence: Not At Risk (03/17/2024)   Humiliation, Afraid, Rape, and Kick questionnaire    Fear of Current or Ex-Partner: No    Emotionally Abused: No    Physically Abused: No    Sexually Abused: No    Allergies: Allergies  Allergen Reactions   Sulfa Antibiotics Nausea Only     Objective:  Physical Examination:  BP 135/65   Pulse 79   Temp 98.6 F (37 C)   Resp 20   Wt 279 lb 14.4 oz (127 kg)   SpO2 100%   BMI 48.20 kg/m    ECOG Performance Status: 2 - Symptomatic with joint pain; uses a wheechair  GENERAL: No acute distress HEENT:  atraumatic normocephalic   Pelvic: deferred 03/17/2024  required four team members. Chaperoned by NP EGBUS: erythema purplish, swollen. No gross lesion Cervix: no lesions, nontender. Long at least 4 cm.  Vagina: no lesions, no discharge or bleeding Uterus: unable to determine size Adnexa: unable to assess due to habitus EMBx The os was entered with the pipelle was inserted to at least 12 cm.    Lab Review Labs on site today: N/A  Radiologic Imaging: N/A    Assessment:  Michelle Marks is a 73 y.o. female diagnosed with postmenopausal bleeding and thickened endometrium with endometrial biopsy showing complex hyperplasia without atypia.   ASCUS/HRHPV pap   Medical co-morbidities complicating care: Hypothyroidism, wheelchair bound, diabetes and Body mass index is 48.2 kg/m.   Plan:   Problem List Items Addressed This Visit       Genitourinary   Complex endometrial hyperplasia without atypia - Primary   Other Visit Diagnoses       Atypical squamous  cell changes of undetermined significance (ASCUS) on cervical cytology with negative high risk human papilloma virus (HPV) test result  We discussed the pathology results. Since she does not have a malignancy she will see Dr. Starla regarding management options (either oral hormonal therapy or the Mirena IUD). I did personally recommend the LNG-IUD but the patient was too excited about this approach. I spoke with Dr. Starla and she will provide counseling for Megace and other options. The patient will contact Dr. Danie office to schedule an appointment.   Follow up pap based on ASCCP guidelines.  Suggested return to clinic in 4 months for repeat EMBx.   The patient's diagnosis, an outline of the further diagnostic and laboratory studies which will be required, the recommendation, and alternatives were discussed.  All questions were answered to the patient's satisfaction.  A total of 20 minutes were spent with the patient/family today; >50% was spent in education, counseling and coordination of care for postmenopausal bleeding and thickened endometrium due to complex hyperplasia without atypia. Level of service based on medical decision making and counseling.   Sissy Goetzke Isidor Constable, MD

## 2024-04-07 NOTE — Patient Instructions (Addendum)
 Please call [facility] to make an appointment with Dr. Harland Birkenhead. [phone]

## 2024-04-28 ENCOUNTER — Ambulatory Visit: Admitting: Obstetrics & Gynecology

## 2024-04-28 VITALS — BP 139/78 | HR 74 | Ht 63.9 in | Wt 278.0 lb

## 2024-04-28 DIAGNOSIS — N950 Postmenopausal bleeding: Secondary | ICD-10-CM | POA: Diagnosis not present

## 2024-04-28 DIAGNOSIS — R9389 Abnormal findings on diagnostic imaging of other specified body structures: Secondary | ICD-10-CM

## 2024-04-28 DIAGNOSIS — N8501 Benign endometrial hyperplasia: Secondary | ICD-10-CM | POA: Diagnosis not present

## 2024-04-28 DIAGNOSIS — Z6841 Body Mass Index (BMI) 40.0 and over, adult: Secondary | ICD-10-CM

## 2024-04-28 MED ORDER — MEGESTROL ACETATE 40 MG PO TABS: 40.0000 mg | ORAL_TABLET | Freq: Two times a day (BID) | ORAL | 3 refills | Status: DC

## 2024-04-28 NOTE — Progress Notes (Signed)
    GYNECOLOGY PROGRESS NOTE  Subjective:    Patient ID: Michelle Marks, female    DOB: [birth year], 73 y.o.   MRN: [id number]  HPI  Patient is a 73 y.o. G1P1001 here to discuss management of her endometrial hyperplasia without atypia. She had an EMBX at gyn onc last month. A Mirena IUD was rec'd to her but she declined that option.   The following portions of the patient's history were reviewed and updated as appropriate: allergies, current medications, past family history, past medical history, past social history, past surgical history, and problem list.  Review of Systems negative   Objective:   Blood pressure 139/78, pulse 74, height 5' 3.9 (1.623 m), weight 278 lb (126.1 kg). Body mass index is 47.87 kg/m. Well nourished, well hydrated White female, no apparent distress She is conversing normally. She is wheelchair bound.    Assessment:   1. PMB (postmenopausal bleeding)   2. Morbid obesity with body mass index (BMI) of 45.0 to 49.9 in adult (HCC)   3. Thickened endometrium   4. Complex endometrial hyperplasia without atypia      Plan:   1. PMB (postmenopausal bleeding) (Primary)   2. Morbid obesity with body mass index (BMI) of 45.0 to 49.9 in adult (HCC)   3. Thickened endometrium   4. Complex endometrial hyperplasia without atypia - she will start taking megace 40 mg BID and has an appt with gyn onc for a repeat EMBX in about 4 months. I encouraged weight loss.

## 2024-05-03 ENCOUNTER — Telehealth: Payer: Self-pay | Admitting: Nurse Practitioner

## 2024-05-03 NOTE — Telephone Encounter (Signed)
 Copied from CRM #[id number]. Topic: Clinical - Medication Question >> May 03, 2024  2:57 PM Fonda T wrote: Reason for CRM: Pt calling with medication questions would like to ask provider.   Pt states she has to start taking a hormone medication, Megestrol 40mg , prescribed by gynecologist.  Side effect indcates it will raise blood sugars, wants to know if this is still ok to take. and would like to know from pcp what her receommendations are, as well as starting Ozempic.  Pt is concerned about starting several new medications at the same time with different side effects.  Pt requesting a return call back to [phone].

## 2024-05-03 NOTE — Telephone Encounter (Signed)
 Scheduled for virtual visit for 05/05/2024.

## 2024-05-05 ENCOUNTER — Encounter: Payer: Self-pay | Admitting: Nurse Practitioner

## 2024-05-05 ENCOUNTER — Telehealth: Admitting: Nurse Practitioner

## 2024-05-05 ENCOUNTER — Ambulatory Visit: Attending: Pain Medicine | Admitting: Pain Medicine

## 2024-05-05 ENCOUNTER — Encounter: Payer: Self-pay | Admitting: Pain Medicine

## 2024-05-05 VITALS — BP 139/69 | HR 76 | Temp 97.3°F | Resp 16 | Ht 64.0 in | Wt 278.0 lb

## 2024-05-05 DIAGNOSIS — E790 Hyperuricemia without signs of inflammatory arthritis and tophaceous disease: Secondary | ICD-10-CM | POA: Diagnosis present

## 2024-05-05 DIAGNOSIS — R7309 Other abnormal glucose: Secondary | ICD-10-CM | POA: Diagnosis present

## 2024-05-05 DIAGNOSIS — E119 Type 2 diabetes mellitus without complications: Secondary | ICD-10-CM | POA: Diagnosis not present

## 2024-05-05 DIAGNOSIS — M1611 Unilateral primary osteoarthritis, right hip: Secondary | ICD-10-CM | POA: Diagnosis present

## 2024-05-05 DIAGNOSIS — M25551 Pain in right hip: Secondary | ICD-10-CM | POA: Insufficient documentation

## 2024-05-05 DIAGNOSIS — M79604 Pain in right leg: Secondary | ICD-10-CM | POA: Insufficient documentation

## 2024-05-05 DIAGNOSIS — R7689 Other specified abnormal immunological findings in serum: Secondary | ICD-10-CM | POA: Diagnosis present

## 2024-05-05 DIAGNOSIS — M5459 Other low back pain: Secondary | ICD-10-CM | POA: Diagnosis present

## 2024-05-05 DIAGNOSIS — R935 Abnormal findings on diagnostic imaging of other abdominal regions, including retroperitoneum: Secondary | ICD-10-CM | POA: Diagnosis present

## 2024-05-05 DIAGNOSIS — M87051 Idiopathic aseptic necrosis of right femur: Secondary | ICD-10-CM | POA: Insufficient documentation

## 2024-05-05 DIAGNOSIS — M25561 Pain in right knee: Secondary | ICD-10-CM | POA: Insufficient documentation

## 2024-05-05 DIAGNOSIS — Z6841 Body Mass Index (BMI) 40.0 and over, adult: Secondary | ICD-10-CM | POA: Diagnosis present

## 2024-05-05 DIAGNOSIS — N8501 Benign endometrial hyperplasia: Secondary | ICD-10-CM

## 2024-05-05 DIAGNOSIS — M545 Low back pain, unspecified: Secondary | ICD-10-CM | POA: Diagnosis present

## 2024-05-05 DIAGNOSIS — M1A0710 Idiopathic chronic gout, right ankle and foot, without tophus (tophi): Secondary | ICD-10-CM | POA: Diagnosis present

## 2024-05-05 DIAGNOSIS — L4050 Arthropathic psoriasis, unspecified: Secondary | ICD-10-CM | POA: Diagnosis present

## 2024-05-05 DIAGNOSIS — M47816 Spondylosis without myelopathy or radiculopathy, lumbar region: Secondary | ICD-10-CM | POA: Diagnosis present

## 2024-05-05 DIAGNOSIS — M1711 Unilateral primary osteoarthritis, right knee: Secondary | ICD-10-CM | POA: Insufficient documentation

## 2024-05-05 DIAGNOSIS — G8929 Other chronic pain: Secondary | ICD-10-CM | POA: Diagnosis present

## 2024-05-05 DIAGNOSIS — E1169 Type 2 diabetes mellitus with other specified complication: Secondary | ICD-10-CM

## 2024-05-05 DIAGNOSIS — E669 Obesity, unspecified: Secondary | ICD-10-CM | POA: Diagnosis not present

## 2024-05-05 DIAGNOSIS — E559 Vitamin D deficiency, unspecified: Secondary | ICD-10-CM | POA: Diagnosis present

## 2024-05-05 DIAGNOSIS — R700 Elevated erythrocyte sedimentation rate: Secondary | ICD-10-CM | POA: Diagnosis present

## 2024-05-05 DIAGNOSIS — R936 Abnormal findings on diagnostic imaging of limbs: Secondary | ICD-10-CM | POA: Diagnosis present

## 2024-05-05 NOTE — Assessment & Plan Note (Signed)
 A1c 6.9% in August. Discussed at length with her.  She does not want to start medication as of yet, wishes to continue focus on diet reducing carbohydrate and sugar + increasing chair exercises.  Will trial this, but if started to have elevations in glucose or weight gain with the Megace  she is starting she will alert provider and we will then start Ozempic or Trulicity.  Would avoid SGLT2 due to her difficulty getting to bathroom and concern for fall.  Urine ALB ordered, but she has not returned this to office as of yet. - No ACE/ARB or statin - refuses - Refuses vaccinations - Not Up To Date eye exam.  Foot exam up to date.

## 2024-05-05 NOTE — Patient Instructions (Signed)
 ______________________________________________________________________    Procedure instructions  Stop blood-thinners  Do not eat or drink fluids (other than water ) for 6 hours before your procedure  No water  for 2 hours before your procedure  Take your blood pressure medicine with a sip of water   Arrive 30 minutes before your appointment  If sedation is planned, bring suitable driver. [place], [place], & public transportation are NOT APPROVED)  Carefully read the Preparing for your procedure detailed instructions  If you have questions call us  at [phone]  Procedure appointments are for procedures only.   NO medication refills or new problem evaluations will be done on procedure days.   Only the scheduled, pre-approved procedure and side will be done.   ______________________________________________________________________     ______________________________________________________________________    Preparing for your procedure  Appointments: If you think you may not be able to keep your appointment, call 24-48 hours in advance to cancel. We need time to make it available to others.  Procedure visits are for procedures only. During your procedure appointment there will be: NO Prescription Refills*. NO medication changes or discussions*. NO discussion of disability issues*. NO unrelated pain problem evaluations*. NO evaluations to order other pain procedures*. *These will be addressed at a separate and distinct evaluation encounter on the provider's evaluation schedule and not during procedure days.  Instructions: Food intake: Avoid eating anything solid for at least 8 hours prior to your procedure. Clear liquid intake: You may take clear liquids such as water  up to 2 hours prior to your procedure. (No carbonated drinks. No soda.) Transportation: Unless otherwise stated by your physician, bring a driver. (Driver cannot be a Market researcher, Pharmacist, community, or any other form of public  transportation.) Morning Medicines: Except for blood thinners, take all of your other morning medications with a sip of water . Make sure to take your heart and blood pressure medicines. If your blood pressure's lower number is above 100, the case will be rescheduled. Blood thinners: Make sure to stop your blood thinners as instructed.  If you take a blood thinner, but were not instructed to stop it, call our office [phone] and ask to talk to a nurse. Not stopping a blood thinner prior to certain procedures could lead to serious complications. Diabetics on insulin : Notify the staff so that you can be scheduled 1st case in the morning. If your diabetes requires high dose insulin , take only  of your normal insulin  dose the morning of the procedure and notify the staff that you have done so. Preventing infections: Shower with an antibacterial soap the morning of your procedure.  Build-up your immune system: Take 1000 mg of Vitamin C with every meal (3 times a day) the day prior to your procedure. Antibiotics: Inform the nursing staff if you are taking any antibiotics or if you have any conditions that may require antibiotics prior to procedures. (Example: recent joint implants)   Pregnancy: If you are pregnant make sure to notify the nursing staff. Not doing so may result in injury to the fetus, including death.  Sickness: If you have a cold, fever, or any active infections, call and cancel or reschedule your procedure. Receiving steroids while having an infection may result in complications. Arrival: You must be in the facility at least 30 minutes prior to your scheduled procedure. Tardiness: Your scheduled time is also the cutoff time. If you do not arrive at least 15 minutes prior to your procedure, you will be rescheduled.  Children: Do not bring any children with  you. Make arrangements to keep them home. Dress appropriately: There is always a possibility that your clothing may get soiled. Avoid  long dresses. Valuables: Do not bring any jewelry or valuables.  Reasons to call and reschedule or cancel your procedure: (Following these recommendations will minimize the risk of a serious complication.) Surgeries: Avoid having procedures within 2 weeks of any surgery. (Avoid for 2 weeks before or after any surgery). Flu Shots: Avoid having procedures within 2 weeks of a flu shots or . (Avoid for 2 weeks before or after immunizations). Barium: Avoid having a procedure within 7-10 days after having had a radiological study involving the use of radiological contrast. (Myelograms, Barium swallow or enema study). Heart attacks: Avoid any elective procedures or surgeries for the initial 6 months after a Myocardial Infarction (Heart Attack). Blood thinners: It is imperative that you stop these medications before procedures. Let us  know if you if you take any blood thinner.  Infection: Avoid procedures during or within two weeks of an infection (including chest colds or gastrointestinal problems). Symptoms associated with infections include: Localized redness, fever, chills, night sweats or profuse sweating, burning sensation when voiding, cough, congestion, stuffiness, runny nose, sore throat, diarrhea, nausea, vomiting, cold or Flu symptoms, recent or current infections. It is specially important if the infection is over the area that we intend to treat. Heart and lung problems: Symptoms that may suggest an active cardiopulmonary problem include: cough, chest pain, breathing difficulties or shortness of breath, dizziness, ankle swelling, uncontrolled high or unusually low blood pressure, and/or palpitations. If you are experiencing any of these symptoms, cancel your procedure and contact your primary care physician for an evaluation.  Remember:  Regular Business hours are:  Monday to Thursday 8:00 AM to 4:00 PM  Provider's Schedule: Eric Como, MD:  Procedure days: Tuesday and Thursday 7:30  AM to 4:00 PM  Wallie Sherry, MD:  Procedure days: Monday and Wednesday 7:30 AM to 4:00 PM Last  Updated: 05/27/2023 ______________________________________________________________________     ______________________________________________________________________    General Risks and Possible Complications  Patient Responsibilities: It is important that you read this as it is part of your informed consent. It is our duty to inform you of the risks and possible complications associated with treatments offered to you. It is your responsibility as a patient to read this and to ask questions about anything that is not clear or that you believe was not covered in this document.  Patient's Rights: You have the right to refuse treatment. You also have the right to change your mind, even after initially having agreed to have the treatment done. However, under this last option, if you wait until the last second to change your mind, you may be charged for the materials used up to that point.  Introduction: Medicine is not an Visual merchandiser. Everything in Medicine, including the lack of treatment(s), carries the potential for danger, harm, or loss (which is by definition: Risk). In Medicine, a complication is a secondary problem, condition, or disease that can aggravate an already existing one. All treatments carry the risk of possible complications. The fact that a side effects or complications occurs, does not imply that the treatment was conducted incorrectly. It must be clearly understood that these can happen even when everything is done following the highest safety standards.  No treatment: You can choose not to proceed with the proposed treatment alternative. The "PRO(s)" would include: avoiding the risk of complications associated with the therapy. The "CON(s)" would include:  not getting any of the treatment benefits. These benefits fall under one of three categories: diagnostic; therapeutic; and/or  palliative. Diagnostic benefits include: getting information which can ultimately lead to improvement of the disease or symptom(s). Therapeutic benefits are those associated with the successful treatment of the disease. Finally, palliative benefits are those related to the decrease of the primary symptoms, without necessarily curing the condition (example: decreasing the pain from a flare-up of a chronic condition, such as incurable terminal cancer).  General Risks and Complications: These are associated to most interventional treatments. They can occur alone, or in combination. They fall under one of the following six (6) categories: no benefit or worsening of symptoms; bleeding; infection; nerve damage; allergic reactions; and/or death. No benefits or worsening of symptoms: In Medicine there are no guarantees, only probabilities. No healthcare provider can ever guarantee that a medical treatment will work, they can only state the probability that it may. Furthermore, there is always the possibility that the condition may worsen, either directly, or indirectly, as a consequence of the treatment. Bleeding: This is more common if the patient is taking a blood thinner, either prescription or over the counter (example: Goody Powders, Fish oil, Aspirin, Garlic, etc.), or if suffering a condition associated with impaired coagulation (example: Hemophilia, cirrhosis of the liver, low platelet counts, etc.). However, even if you do not have one on these, it can still happen. If you have any of these conditions, or take one of these drugs, make sure to notify your treating physician. Infection: This is more common in patients with a compromised immune system, either due to disease (example: diabetes, cancer, human immunodeficiency virus [HIV], etc.), or due to medications or treatments (example: therapies used to treat cancer and rheumatological diseases). However, even if you do not have one on these, it can still  happen. If you have any of these conditions, or take one of these drugs, make sure to notify your treating physician. Nerve Damage: This is more common when the treatment is an invasive one, but it can also happen with the use of medications, such as those used in the treatment of cancer. The damage can occur to small secondary nerves, or to large primary ones, such as those in the spinal cord and brain. This damage may be temporary or permanent and it may lead to impairments that can range from temporary numbness to permanent paralysis and/or brain death. Allergic Reactions: Any time a substance or material comes in contact with our body, there is the possibility of an allergic reaction. These can range from a mild skin rash (contact dermatitis) to a severe systemic reaction (anaphylactic reaction), which can result in death. Death: In general, any medical intervention can result in death, most of the time due to an unforeseen complication. ______________________________________________________________________      ______________________________________________________________________    Steroid injections  Common steroids for injections Triamcinolone: Used by many sports medicine physicians for large joint and bursal injections, often combined with a local anesthetic like lidocaine . A study focusing on coccydynia (tailbone pain) found triamcinolone was more effective than betamethasone , suggesting it may also be preferable for other localized inflammation conditions. Methylprednisolone: A common alternative to triamcinolone that is also a strong anti-inflammatory. It is available in different formulations, with the acetate suspension being the long-acting option for intra-articular injections. Dexamethasone : This is a non-particulate steroid, meaning it has a lower risk of tissue damage compared to particulate steroids like triamcinolone and methylprednisolone. While less common for this specific  use,  it is an option for targeted injections.   Considerations for physicians Particulate vs. non-particulate steroids: Triamcinolone and methylprednisolone are particulate, meaning they can clump together. Dexamethasone  is non-particulate. Particulate steroids are often preferred for their longer-lasting effects but carry a theoretical higher risk for certain injections (though this is less of a concern in the costochondral joints). Combined injectate: Corticosteroids are typically mixed with a local anesthetic like lidocaine  to provide both immediate pain relief (from the anesthetic) and longer-term inflammation reduction (from the steroid). Imaging guidance: To ensure accurate placement of the needle and medication, physicians may use ultrasound or fluoroscopic guidance for the injection, especially in complex or refractory cases.   Patient guidance Before undergoing a steroid injection, discuss the options with your physician. They will determine the best steroid, dosage, and procedure for your specific case based on factors like: Severity of your condition History of response to other treatments Your overall health status Experience and preference of the physician  Last  Updated: 02/10/2024 ______________________________________________________________________

## 2024-05-05 NOTE — Progress Notes (Signed)
 PROVIDER NOTE: Interpretation of information contained herein should be left to medically-trained personnel. Specific patient instructions are provided elsewhere under Patient Instructions section of medical record. This document was created in part using AI and STT-dictation technology, any transcriptional errors that may result from this process are unintentional.  Patient: Michelle Marks  Service: E/M   PCP: Michelle Melanie DASEN, NP  DOB: 09-18-1950  DOS: 05/05/2024  Provider: Eric DELENA Como, MD  MRN: [id number]  Delivery: Face-to-face  Specialty: Interventional Pain Management  Type: Established Patient  Setting: Ambulatory outpatient facility  Specialty designation: 09  Referring Prov.: Michelle Melanie DASEN, NP  Location: Outpatient office facility       History of present illness (HPI) Michelle Marks, a 73 y.o. year old female, is here today because of her Chronic bilateral low back pain without sciatica [M54.50, G89.29]. 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  Pain Assessment: Severity of Chronic pain is reported as a 0-No pain/10. Location: Back Lower, Right, Left/? into right hip  and down right leg.. Onset: More than a month ago (starts at night approx 1 hour into sleep). Quality: Discomfort, Sharp. Timing: Intermittent. Modifying factor(s): getting out of bed and sitting up.  then gets back to bed and the pain cycle starts over. Vitals:  height is 5' 4 (1.626 m) and weight is 278 lb (126.1 kg). Her temporal temperature is 97.3 F (36.3 C) (abnormal). Her blood pressure is 139/69 and her pulse is 76. Her respiration is 16 and oxygen saturation is 100%.  BMI: Estimated body mass index is 47.72 kg/m as calculated from the following:   Height as of this encounter: 5' 4 (1.626 m).   Weight as of this encounter: 278 lb (126.1 kg).  Last encounter: 11/26/2023.  (11/04/2023) MRI of the right knee and right hip. Last procedure: 10/28/2023.  (Diagnostic right Lumbar facet MBB #1) (100/100/60/60)   Reason for encounter: evaluation of worsening, or previously known (established) problem.   (11/25/2023) RIGHT HIP MRI FINDINGS: IMPRESSION: 1. Severe degenerative changes of the right hip with prominent subchondral bone marrow edema along both sides of the joint. There is some flattening of the right femoral head contour. Underlying avascular necrosis would be difficult to exclude.  2. Small-moderate complex right hip joint effusion, likely reactive.  3. Circumscribed rounded 2.2 cm lesion in the intertrochanteric aspect of the proximal right femur. This is favored to represent a benign fibro-osseous lesion such as a liposclerosing myxofibrous tumor. Correlation with prior cross-sectional imaging of the pelvis or hips would be helpful to assess the stability of this finding. If no prior imaging is available, a follow-up MRI in 6 months could be considered to ensure stability.  4. Abnormally thickened and heterogeneous endometrial stripe measuring approximately 15  mm. Recommend further evaluation with pelvic ultrasound.  (11/25/2023 RIGHT KNEE MRI FINDINGS: IMPRESSION: 1. Severe  tricompartmental osteoarthritis of the right knee. 2. Complex degenerative tearing of the medial and lateral menisci. 3. Small complex knee joint effusion containing several small intra-articular loose bodies.  Discussed the use of AI scribe software for clinical note transcription with the patient, who gave verbal consent to proceed.  History of Present Illness   Michelle Marks is a 73 year old female with severe osteoarthritis and degenerative changes who presents for follow-up evaluation of pain management.  She experienced 100% relief from a diagnostic right lumbar facet medial branch block on 10/28/23 during the local anesthetic's duration, with ongoing 60% improvement. Persistent back pain, localized to the middle to right side of the lower back, has worsened over the last two months, affecting sleep and limiting it to about an hour at a time.  MRI of the right hip shows severe degenerative changes, subchondral bone marrow edema, flattening of the femoral head, and a small to moderate joint effusion. A 2.2 cm lesion is present in the proximal right femur. MRI of the right knee reveals severe tricompartmental osteoarthritis, complex degenerative tears of the menisci, and a small joint effusion with loose bodies.  She uses a walker for mobility and has a ramp at home. Referred pain in the right leg extends to the knee, particularly through the back of the leg, worsening upon waking and movement.       Pharmacotherapy Assessment   Analgesic: None MME/day: 0 mg/day   Monitoring: [place] PMP: PDMP reviewed during this encounter.       Pharmacotherapy: No side-effects or adverse reactions reported. Compliance: No problems identified. Effectiveness: Clinically acceptable.  No notes on file  UDS:  Summary  Date Value Ref Range Status  09/22/2023 FINAL  Final    Comment:    ==================================================================== Compliance Drug Analysis,  [place] ==================================================================== Test                             Result       Flag       Units    NO DRUGS DETECTED. ==================================================================== Test                      Result    Flag   Units      Ref Range   Creatinine              60               mg/dL      >=79 ==================================================================== Declared Medications:  Medication list was not provided. ==================================================================== For clinical consultation, please call [phone]. ====================================================================     No results found for: CBDTHCR No results found for: D8THCCBX No results found for: D9THCCBX  ROS  Constitutional: Denies any fever or chills Gastrointestinal: No reported hemesis, hematochezia, vomiting, or acute GI distress Musculoskeletal: Denies any acute onset joint swelling, redness, loss of ROM, or weakness Neurological: No reported episodes of acute onset apraxia, aphasia, dysarthria, agnosia, amnesia, paralysis, loss of coordination, or loss of consciousness  Medication Review  Accu-Chek FastClix Lancets, Accu-Chek Guide, atorvastatin , augmented betamethasone  dipropionate, ergocalciferol , esomeprazole , glucose blood, levothyroxine , and torsemide   History Review  Allergy: Michelle Marks is allergic to sulfa antibiotics. Drug: Michelle Marks  reports no history of drug use. Alcohol:  reports current alcohol use. Tobacco:  reports that she quit smoking about 20  years ago. Her smoking use included cigarettes. She started smoking about 52 years ago. She has a 16.1 pack-year smoking history. She has never used smokeless tobacco. Social: Michelle Marks  reports that she quit smoking about 20 years ago. Her smoking use included cigarettes. She started smoking about 52 years ago. She has a 16.1 pack-year smoking history. She  has never used smokeless tobacco. She reports current alcohol use. She reports that she does not use drugs. Medical:  has a past medical history of Anxiety, Arthritis, Difficult intubation, Edema, leg, Endometrial polyp, GERD (gastroesophageal reflux disease), Hyperlipidemia, and Morbid obesity (HCC) (10/24/2016). Surgical: Michelle Marks  has a past surgical history that includes Dilation and curettage of uterus; Hysteroscopy; Joint replacement (Left); Knee arthroscopy (Left); Tonsillectomy; Breast surgery (Right); Hysteroscopy with D & C (N/A, 12/02/2016); Cervical polypectomy (N/A, 12/02/2016); and Breast cyst excision (Right, age 22). Family: family history includes Cancer in her mother and sister; Diabetes in her father and mother; Hypertension in her mother; Pancreatitis in her father.  Laboratory Chemistry Profile   Renal Lab Results  Component Value Date   BUN 18 02/06/2024   CREATININE 0.63 02/06/2024   BCR 29 (H) 02/06/2024    Hepatic Lab Results  Component Value Date   AST 14 02/06/2024   ALT 13 02/06/2024   ALBUMIN 4.1 02/06/2024   ALKPHOS 165 (H) 02/06/2024    Electrolytes Lab Results  Component Value Date   NA 143 02/06/2024   K 3.3 (L) 02/06/2024   CL 97 02/06/2024   CALCIUM  9.3 02/06/2024   MG CANCELED 09/22/2023   MG 2.2 09/22/2023    Bone Lab Results  Component Value Date   VD25OH 23.5 (L) 02/06/2024   25OHVITD1 CANCELED 09/22/2023   25OHVITD1 30 09/22/2023   25OHVITD2 19 09/22/2023   25OHVITD3 11 09/22/2023    Inflammation (CRP: Acute Phase) (ESR: Chronic Phase) Lab Results  Component Value Date   CRP CANCELED 09/22/2023   CRP 4 09/22/2023   ESRSEDRATE CANCELED 09/22/2023   ESRSEDRATE 54 (H) 09/22/2023         Note: Above Lab results reviewed.  Recent Imaging Review  MR KNEE RIGHT WO CONTRAST CLINICAL DATA:  Chronic knee pain  EXAM: MRI OF THE RIGHT KNEE WITHOUT CONTRAST  TECHNIQUE: Multiplanar, multisequence MR imaging of the knee was  performed. No intravenous contrast was administered.  COMPARISON:  X-ray 09/22/2023  FINDINGS: MENISCI  Medial meniscus: Complex degenerative tearing of the medial meniscal body and posterior horn.  Lateral meniscus: Complex tearing/maceration of the lateral meniscal body and anterior horn.  LIGAMENTS  Cruciates: Attenuated appearance of the ACL. Intact PCL with mucoid degeneration.  Collaterals: Intact MCL. Lateral collateral ligament complex intact.  CARTILAGE  Patellofemoral: High-grade cartilage loss of the lateral patellar facet and lateral trochlea.  Medial: Severe medial compartment osteoarthritis with extensive full-thickness cartilage loss.  Lateral: Severe lateral compartment osteoarthritis with extensive full-thickness cartilage loss.  MISCELLANEOUS  Joint: Small complex knee joint effusion containing several small intra-articular loose bodies. Fat pads within normal limits.  Popliteal Fossa:  No Baker's cyst. Intact popliteus tendon.  Extensor Mechanism:  Intact quadriceps and patellar tendons.  Bones: No acute fracture. No dislocation. Tricompartmental joint space narrowing with marginal osteophyte formation. No bone marrow edema. No marrow replacing bone lesion.  Other: Generalized subcutaneous edema about the knee.  IMPRESSION: 1. Severe tricompartmental osteoarthritis of the right knee. 2. Complex degenerative tearing of the medial and lateral menisci. 3. Small complex knee joint effusion containing several small intra-articular loose  bodies.  Electronically Signed   By: Mabel Converse D.O.   On: 11/25/2023 10:18 MR HIP RIGHT WO CONTRAST CLINICAL DATA:  Chronic right hip pain  EXAM: MR OF THE RIGHT HIP WITHOUT CONTRAST  TECHNIQUE: Multiplanar, multisequence MR imaging was performed. No intravenous contrast was administered.  COMPARISON:  X-ray 09/22/2023  FINDINGS: Bones: No acute fracture. No dislocation. Severe  degenerative changes of the right hip with prominent subchondral bone marrow edema along both sides of the joint. Bone marrow edema extends through the right femoral neck. There is some flattening of the right femoral head contour. Underlying avascular necrosis would be difficult to exclude. Mild degenerative changes of the contralateral left hip. No left femoral head avascular necrosis. Bony pelvis intact without diastasis. SI joints and pubic symphysis within normal limits. Circumscribed rounded 2.2 cm T1 hypointense lesion in the intertrochanteric aspect of the proximal right femur. No additional bone lesions.  Articular cartilage and labrum  Articular cartilage: Complete cartilage loss along both sides of the right hip joint with prominent subchondral marrow edema.  Labrum:  Diffusely degenerated and torn.  Joint or bursal effusion  Joint effusion:  Small-moderate complex right hip joint effusion.  Bursae: No abnormal bursal fluid collection.  Muscles and tendons  Muscles and tendons: The gluteal, hamstring, iliopsoas, rectus femoris, and adductor tendons appear intact without tear or significant tendinosis. Normal muscle bulk and signal intensity without edema, atrophy, or fatty infiltration.  Other findings  Miscellaneous: Abnormally thickened and heterogeneous endometrial stripe measuring approximately 15 mm. Colonic diverticulosis. No soft tissue edema or fluid collection. No inguinal lymphadenopathy.  IMPRESSION: 1. Severe degenerative changes of the right hip with prominent subchondral bone marrow edema along both sides of the joint. There is some flattening of the right femoral head contour. Underlying avascular necrosis would be difficult to exclude. 2. Small-moderate complex right hip joint effusion, likely reactive. 3. Circumscribed rounded 2.2 cm lesion in the intertrochanteric aspect of the proximal right femur. This is favored to represent a benign  fibro-osseous lesion such as a liposclerosing myxofibrous tumor. Correlation with prior cross-sectional imaging of the pelvis or hips would be helpful to assess the stability of this finding. If no prior imaging is available, a follow-up MRI in 6 months could be considered to ensure stability. 4. Abnormally thickened and heterogeneous endometrial stripe measuring approximately 15 mm. Recommend further evaluation with pelvic ultrasound.  Electronically Signed   By: Mabel Converse D.O.   On: 11/25/2023 10:12 Note: Reviewed        Physical Exam  Vitals: BP 139/69 (BP Location: Right Arm, Patient Position: Sitting, Cuff Size: Normal)   Pulse 76   Temp (!) 97.3 F (36.3 C) (Temporal)   Resp 16   Ht 5' 4 (1.626 m)   Wt 278 lb (126.1 kg)   SpO2 100%   BMI 47.72 kg/m  BMI: Estimated body mass index is 47.72 kg/m as calculated from the following:   Height as of this encounter: 5' 4 (1.626 m).   Weight as of this encounter: 278 lb (126.1 kg). Ideal: Ideal body weight: 54.7 kg (120 lb 9.5 oz) Adjusted ideal body weight: 83.3 kg (183 lb 8.9 oz) General appearance: Well nourished, well developed, and well hydrated. In no apparent acute distress Mental status: Alert, oriented x 3 (person, place, & time)       Respiratory: No evidence of acute respiratory distress Eyes: PERLA   Assessment   Diagnosis Status  1. Chronic low back pain (1ry area of Pain) (  Bilateral) w/o sciatica   2. Low back pain of over 3 months duration   3. Lumbar facet joint pain   4. Multifactorial low back pain   5. Lumbar facet joint arthropathy   6. Chronic lower extremity pain (2ry area of Pain) (Right)   7. Chronic knee pain (3ry area of Pain) (Right)   8. Osteoarthritis of knee (Right)   9. Abnormal MRI, knee (Right) (11/25/2023)   10. Chronic hip pain (Right)   11. Osteoarthritis of hip (Right)   12. Avascular necrosis of bone of hip, right (HCC)   13. Abnormal MRI, hip (Right) (11/25/2023)   14.  Idiopathic chronic gout of right foot without tophus   15. Elevated uric acid in blood   16. Psoriatic arthritis (HCC)   17. Elevated rheumatoid factor   18. Elevated sed rate   19. Rheumatoid factor positive   20. Vitamin D  deficiency   21. Elevated hemoglobin A1c   22. Morbid obesity with body mass index (BMI) of 45.0 to 49.9 in adult (HCC)   23. Type 2 diabetes mellitus in patient with obesity (HCC)    Controlled Controlled Controlled   Updated Problems: Problem  Abnormal MRI, hip (Right) (11/25/2023)   (11/25/2023) RIGHT HIP MRI FINDINGS: Bones: No acute fracture. No dislocation. Severe degenerative changes of the right hip with prominent subchondral bone marrow edema along both sides of the joint. Bone marrow edema extends through the right femoral neck. There is some flattening of the right femoral head contour. Underlying avascular necrosis would be difficult to exclude. Mild degenerative changes of the contralateral left hip. No left femoral head avascular necrosis. Bony pelvis intact without diastasis. SI joints and pubic symphysis within normal limits. Circumscribed rounded 2.2 cm T1 hypointense lesion in the intertrochanteric aspect of the proximal right femur. No additional bone lesions.   Articular cartilage and labrum   Articular cartilage: Complete cartilage loss along both sides of the right hip joint with prominent subchondral marrow edema.   Labrum:  Diffusely degenerated and torn.   Joint or bursal effusion   Joint effusion:  Small-moderate complex right hip joint effusion.   Bursae: No abnormal bursal fluid collection.   Muscles and tendons   Muscles and tendons: The gluteal, hamstring, iliopsoas, rectus femoris, and adductor tendons appear intact without tear or significant tendinosis. Normal muscle bulk and signal intensity without edema, atrophy, or fatty infiltration.   Other findings   Miscellaneous: Abnormally thickened and heterogeneous  endometrial stripe measuring approximately 15 mm. Colonic diverticulosis. No soft tissue edema or fluid collection. No inguinal lymphadenopathy.   IMPRESSION: 1. Severe degenerative changes of the right hip with prominent subchondral bone marrow edema along both sides of the joint. There is some flattening of the right femoral head contour. Underlying avascular necrosis would be difficult to exclude.  2. Small-moderate complex right hip joint effusion, likely reactive.  3. Circumscribed rounded 2.2 cm lesion in the intertrochanteric aspect of the proximal right femur. This is favored to represent a benign fibro-osseous lesion such as a liposclerosing myxofibrous tumor. Correlation with prior cross-sectional imaging of the pelvis or hips would be helpful to assess the stability of this finding. If no prior imaging is available, a follow-up MRI in 6 months could be considered to ensure stability.  4. Abnormally thickened and heterogeneous endometrial stripe measuring approximately 15 mm. Recommend further evaluation with pelvic ultrasound.   Abnormal MRI, knee (Right) (11/25/2023)   (11/25/2023 RIGHT KNEE MRI FINDINGS: MENISCI   Medial meniscus: Complex degenerative  tearing of the medial meniscal body and posterior horn.   Lateral meniscus: Complex tearing/maceration of the lateral meniscal body and anterior horn.   LIGAMENTS   Cruciates: Attenuated appearance of the ACL. Intact PCL with mucoid degeneration.   Collaterals: Intact MCL. Lateral collateral ligament complex intact.   CARTILAGE   Patellofemoral: High-grade cartilage loss of the lateral patellar facet and lateral trochlea.   Medial: Severe medial compartment osteoarthritis with extensive full-thickness cartilage loss.   Lateral: Severe lateral compartment osteoarthritis with extensive full-thickness cartilage loss.   MISCELLANEOUS   Joint: Small complex knee joint effusion containing several small intra-articular loose  bodies. Fat pads within normal limits.   Popliteal Fossa:  No Baker's cyst. Intact popliteus tendon.   Extensor Mechanism:  Intact quadriceps and patellar tendons.   Bones: No acute fracture. No dislocation. Tricompartmental joint space narrowing with marginal osteophyte formation. No bone marrow edema. No marrow replacing bone lesion.   Other: Generalized subcutaneous edema about the knee.   IMPRESSION: 1. Severe tricompartmental osteoarthritis of the right knee. 2. Complex degenerative tearing of the medial and lateral menisci. 3. Small complex knee joint effusion containing several small intra-articular loose bodies.   Psoriatic Arthritis (Hcc)  Osteoarthritis of hip (Right)   (11/25/2023) RIGHT HIP MRI FINDINGS: IMPRESSION: 1. Severe degenerative changes of the right hip with prominent subchondral bone marrow edema along both sides of the joint. There is some flattening of the right femoral head contour. Underlying avascular necrosis would be difficult to exclude.  2. Small-moderate complex right hip joint effusion, likely reactive.  3. Circumscribed rounded 2.2 cm lesion in the intertrochanteric aspect of the proximal right femur. This is favored to represent a benign fibro-osseous lesion such as a liposclerosing myxofibrous tumor. Correlation with prior cross-sectional imaging of the pelvis or hips would be helpful to assess the stability of this finding. If no prior imaging is available, a follow-up MRI in 6 months could be considered to ensure stability.   Osteoarthritis of knee (Right)   (11/25/2023 RIGHT KNEE MRI FINDINGS: IMPRESSION: 1. Severe tricompartmental osteoarthritis of the right knee. 2. Complex degenerative tearing of the medial and lateral menisci. 3. Small complex knee joint effusion containing several small intra-articular loose bodies.   Chronic hip pain (Right)   (11/25/2023) RIGHT HIP MRI FINDINGS: IMPRESSION: 1. Severe degenerative changes of the right  hip with prominent subchondral bone marrow edema along both sides of the joint. There is some flattening of the right femoral head contour. Underlying avascular necrosis would be difficult to exclude.  2. Small-moderate complex right hip joint effusion, likely reactive.  3. Circumscribed rounded 2.2 cm lesion in the intertrochanteric aspect of the proximal right femur. This is favored to represent a benign fibro-osseous lesion such as a liposclerosing myxofibrous tumor. Correlation with prior cross-sectional imaging of the pelvis or hips would be helpful to assess the stability of this finding. If no prior imaging is available, a follow-up MRI in 6 months could be considered to ensure stability.    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 Severe degenerative changes of the right hip with prominent subchondral bone marrow edema along both sides of the joint. There is some flattening of the right femoral head contour. Underlying avascular necrosis would be difficult to exclude.    Chronic knee pain (3ry area of Pain) (Right)   (11/25/2023 RIGHT KNEE MRI FINDINGS: IMPRESSION: 1. Severe tricompartmental osteoarthritis of the right knee. 2. Complex degenerative tearing  of the medial and lateral menisci. 3. Small complex knee joint effusion containing several small intra-articular loose bodies.   Idiopathic Chronic Gout of Right Foot Without Tophus  Elevated Rheumatoid Factor  Elevated Hemoglobin A1c  Elevated Uric Acid in Blood  Type 2 Diabetes Mellitus in Patient With Obesity (Hcc)   Initial diagnosis 05/01/22 = 6.8%     Plan of Care  Problem-specific:  Assessment and Plan    Lumbar facet osteoarthritis with chronic low back pain and right leg referred pain   Chronic low back pain with referred pain to the right leg has worsened over the last two months. A previous diagnostic right lumbar facet medial branch block provided 100%  relief during the local anesthetic duration and ongoing 60% improvement. Pain is now more severe, localized to the middle to right side of the lower back, with referred pain to the right leg up to the knee. It is influenced by weight and activities involving lifting and twisting. The differential suggests referred pain from facet joints rather than a pinched nerve. A repeat right lumbar facet medial branch block with sedation is ordered to confirm the diagnosis and assess benefit. Radiofrequency ablation will be considered if the repeat block provides significant relief, as it may offer longer-term pain relief (3-18 months). She is advised on weight management and activity modification to reduce pain exacerbation.       Michelle Marks has a current medication list which includes the following long-term medication(s): atorvastatin , esomeprazole , levothyroxine , and torsemide .  Pharmacotherapy (Medications Ordered): No orders of the defined types were placed in this encounter.  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 2.Pain present for longer than 3 months (Chronic) documented to have failed noninvasive conservative therapies. 3.Absence of untreated radiculopathy. 4.There is no radiological evidence of untreated fractures, tumor, infection, or deformity.  Physical Examination Findings: Positive Kemp Maneuver: (Y)  Positive Lumbar Hyperextension-Rotation provocative test: (Y)    Standing Status:   Future    Expiration Date:   08/05/2024    Scheduling Instructions:     Procedure: Lumbar facet Block     Type: Medial Branch Block     Side: Bilateral     Purpose:  Diagnostic/Therapeutic     Level(s): L3-4, L4-5, and L5-S1 Facets (L2, L3, L4, L5, and S1 Medial Branch)     Sedation: With Sedation.     Timeframe: ASAP    Where will this procedure be performed?:   [facility]     Interventional Therapies  Risk Factors  Considerations  Medical Comorbidities:  MO (BMI>40)  RA  DM  gout  psoriatic arthritis  (+) RF Factor        Planned  Pending:   Diagnostic bilateral lumbar facet MBB #2  Referral to orthopedic surgery for evaluation of right knee and right hip arthropathy.  (05/05/2024)   Under consideration:   Diagnostic bilateral lumbar facet MBB #2 with possible follow-up RFA once the patient brings her BMI down closer to 30 kg/m Diagnostic/therapeutic right IA knee inj. #1 (by Dr. Mardee)  Diagnostic/therapeutic right IA hip inj. #1 (by Dr. Mardee)    Completed:   Diagnostic bilateral lumbar facet MBB x1 (10/28/2023) (100/100/60/60)    Therapeutic  Palliative (PRN) options:  None established   Completed by other providers:   Rheumatology evaluation (08/21/2020) by Dr. Tobie [facility] rheumatology)  Orthopedic evaluation and treatment of the right knee and right hip by Dr. Mardee.  We have agreed to let Dr. Mardee take care of the injections while they are in the process of deciding whether to proceed with hip and/or knee replacement.  She is not really crazy about doing that secondary to her age.  If by any chance they decide not to proceed with surgery, I would be more than glad to then take over the long-term management of her right hip and right knee.      Return for (ECT): (B) L-FCT BLK #2.    Recent Visits No visits were found meeting these conditions. Showing recent visits within past 90 days and meeting all other requirements Today's Visits Date Type Provider Dept  05/05/24 Office Visit Tanya Glisson, MD [facility]  Showing today's visits and meeting all other requirements Future Appointments No  visits were found meeting these conditions. Showing future appointments within next 90 days and meeting all other requirements  I discussed the assessment and treatment plan with the patient. The patient was provided an opportunity to ask questions and all were answered. The patient agreed with the plan and demonstrated an understanding of the instructions.  Patient advised to call back or seek an in-person evaluation if the symptoms or condition worsens.  Duration of encounter: 68 minutes.  Total time on encounter, as per AMA guidelines included both the face-to-face and non-face-to-face time personally spent by the physician and/or other qualified health care professional(s) on the day of the encounter (includes time in activities that require the physician or other qualified health care professional and does not include time in activities normally performed by clinical staff). Physician's time may include the following activities when performed: Preparing to see the patient (e.g., pre-charting review of records, searching for previously ordered imaging, lab work, and nerve conduction tests) Review of prior analgesic pharmacotherapies. Reviewing PMP Interpreting ordered tests (e.g., lab work, imaging, nerve conduction tests) Performing post-procedure evaluations, including interpretation of diagnostic procedures Obtaining and/or reviewing separately obtained history Performing a medically appropriate examination and/or evaluation Counseling and educating the patient/family/caregiver Ordering medications, tests, or procedures Referring and communicating with other health care professionals (when not separately reported) Documenting clinical information in the electronic or other health record Independently interpreting results (not separately reported) and communicating results to the patient/ family/caregiver Care coordination (not separately reported)  Note by: Glisson DELENA Tanya, MD (TTS  and AI technology used. I apologize for any typographical errors that were not detected and corrected.) Date: 05/05/2024; Time: 1:44 PM

## 2024-05-05 NOTE — Progress Notes (Signed)
 There were no vitals taken for this visit.   Subjective:    Patient ID: Michelle Marks, female    DOB: 25-Mar-1951, 73 y.o.   MRN: [id number]  HPI: Michelle Marks is a 73 y.o. female  Chief Complaint  Patient presents with   Follow-up    Wants to discuss starts DM meds, consider Ozempic. Most recent was 130 around fasting.    Virtual Visit via Video Note  I connected with Michelle Marks on 05/05/24 at  4:20 PM EST by a video enabled telemedicine application and verified that I am speaking with the correct person using two identifiers.  Location: Patient: home Provider: work   I discussed the limitations of evaluation and management by telemedicine and the availability of in person appointments. The patient expressed understanding and agreed to proceed.  I discussed the assessment and treatment plan with the patient. The patient was provided an opportunity to ask questions and all were answered. The patient agreed with the plan and demonstrated an understanding of the instructions.   The patient was advised to call back or seek an in-person evaluation if the symptoms worsen or if the condition fails to improve as anticipated.  I provided 25 minutes of non-face-to-face time during this encounter.   Michelle Yarbro T Athalia Setterlund, NP   DIABETES A1c in August was 6.9%. At time discussed adding on a GLP1 to assist with diabetes and weight loss, especially with her chronic pain, but she wanted to focus on diet and chair exercises at the time. Saw pain management last 11/26/23 and ortho 01/27/24.   Currently following with GYN due to post menopausal bleeding, full work-up done. Ordered Megace  40 MG BID and encouraged weight loss. She is concerned about weight gain, which is possible side effect, with Megace . Does not want to start Ozempic yet, but would like to try it if has side effects like hyperglycemia or weight gain with Megace . Hypoglycemic episodes:no Polydipsia/polyuria: no Visual  disturbance: no Chest pain: no Paresthesias: no Glucose Monitoring: yes  Accucheck frequency: every other day  Fasting glucose: 130 to 140  Post prandial:  Evening:  Before meals: Taking Insulin?: no  Long acting insulin:  Short acting insulin: Blood Pressure Monitoring: not checking Retinal Examination: Not up to Date Foot Exam: Up to Date Diabetic Education: Not Completed Pneumovax: refused Influenza: refused Aspirin: no   Relevant past medical, surgical, family and social history reviewed and updated as indicated. Interim medical history since our last visit reviewed. Allergies and medications reviewed and updated.  Review of Systems  Constitutional:  Negative for activity change, appetite change, diaphoresis, fatigue and fever.  Respiratory:  Negative for cough, chest tightness and shortness of breath.   Cardiovascular:  Positive for leg swelling. Negative for chest pain and palpitations.  Gastrointestinal: Negative.   Endocrine: Negative for cold intolerance, heat intolerance, polydipsia, polyphagia and polyuria.  Musculoskeletal:  Positive for arthralgias.  Neurological: Negative.   Psychiatric/Behavioral: Negative.      Per HPI unless specifically indicated above     Objective:    There were no vitals taken for this visit.  Wt Readings from Last 3 Encounters:  05/05/24 278 lb (126.1 kg)  04/28/24 278 lb (126.1 kg)  04/07/24 279 lb 14.4 oz (127 kg)    Physical Exam Vitals and nursing note reviewed.  Constitutional:      General: She is awake. She is not in acute distress.    Appearance: She is well-developed and well-groomed. She is obese. She  is not ill-appearing or toxic-appearing.  HENT:     Head: Normocephalic.     Right Ear: Hearing normal.     Left Ear: Hearing normal.  Eyes:     General: Lids are normal.        Right eye: No discharge.        Left eye: No discharge.     Conjunctiva/sclera: Conjunctivae normal.  Pulmonary:     Effort: Pulmonary  effort is normal. No accessory muscle usage or respiratory distress.  Musculoskeletal:     Cervical back: Normal range of motion.  Neurological:     Mental Status: She is alert and oriented to person, place, and time.  Psychiatric:        Attention and Perception: Attention normal.        Mood and Affect: Mood normal.        Behavior: Behavior normal. Behavior is cooperative.        Thought Content: Thought content normal.        Judgment: Judgment normal.     Results for orders placed or performed in visit on 03/17/24  Surgical pathology   Collection Time: 03/17/24 12:00 AM  Result Value Ref Range   SURGICAL PATHOLOGY      SURGICAL PATHOLOGY [facility] [address] [place], [place] [address] Telephone [phone] or [phone] Fax [phone]  REPORT OF SURGICAL PATHOLOGY   Accession #: [id number] Patient Name: Michelle Marks Visit # :   MRN: [id number] Physician: Michelle Marks DOB/Age 07-Jan-1951 (Age: 70) Gender: F Collected Date: 03/17/2024 Received Date: 03/17/2024  FINAL DIAGNOSIS       1. Endometrium, biopsy,  :       -  DISORDERED PROLIFERATIVE ENDOMETRIUM WITH FOCAL COMPLEX HYPERPLASIA WITHOUT      ATYPIA.      -  NEGATIVE FOR ENDOMETRIAL INTRAEPITHELIAL NEOPLASIA AND CARCINOMA ON SECTIONS      EXAMINED.       DATE SIGNED OUT: 03/19/2024 ELECTRONIC SIGNATURE : Michelle Marks, Pathologist, Electronic Signature  MICROSCOPIC DESCRIPTION  CASE COMMENTS STAINS USED IN DIAGNOSIS: H&E    CLINICAL HISTORY  SPECIMEN(S) OBTAINED 1. Endometrium, biopsy,  SPECIMEN COMMENTS: SPECIMEN CLINICAL INFORMATION: 1. PMB     Gross Description 1. Received in formalin is a 2 x 1.8 x 0.3 cm aggregate of red-brown clot material and tan tissue submitted entirely in 1 block. mb 03-18-24        Report signed out from the following location(s) [place] [address], [place]  [address] CLIA #: [id number]  [facility] [address] [place], [place] [address] CLIA #: [id number]       Assessment & Plan:   Problem List Items Addressed This Visit       Endocrine   Type 2 diabetes mellitus in patient with obesity (HCC) - Primary   A1c 6.9% in August. Discussed at length with her.  She does not want to start medication as of yet, wishes to continue focus on diet reducing carbohydrate and sugar + increasing chair exercises.  Will trial this, but if started to have elevations in glucose or weight gain with the Megace she is starting she will alert provider and we will then start Ozempic or Trulicity.  Would avoid SGLT2 due to her difficulty getting to bathroom and concern for fall.  Urine ALB ordered, but she has not returned this to office as of yet. - No ACE/ARB  or statin - refuses - Refuses vaccinations - Not Up To Date eye exam.  Foot exam up to date.        Genitourinary   Complex endometrial hyperplasia without atypia   Will continue to collaborate with GYN. She has picked up Megace but not started yet, plans on starting this week. She will alert PCP if increased sugars or weight gain with this, then will start GLP1 due to her diabetes.        Follow up plan: Return for as scheduled in February 2026.

## 2024-05-05 NOTE — Patient Instructions (Signed)

## 2024-05-05 NOTE — Assessment & Plan Note (Signed)
 Will continue to collaborate with GYN. She has picked up Megace  but not started yet, plans on starting this week. She will alert PCP if increased sugars or weight gain with this, then will start GLP1 due to her diabetes.

## 2024-05-12 NOTE — Progress Notes (Signed)
 Michelle Marks                                          MRN: [id number]   05/12/2024   The [facility] Quality Team Specialist reviewed this patient medical record for the purposes of chart review for care gap closure. The following were reviewed: chart review for care gap closure-kidney health evaluation for diabetes:eGFR  and uACR.    [facility]BCI Quality Team

## 2024-05-18 ENCOUNTER — Ambulatory Visit: Admitting: Pain Medicine

## 2024-05-25 ENCOUNTER — Ambulatory Visit: Admitting: Pain Medicine

## 2024-06-01 ENCOUNTER — Ambulatory Visit: Admitting: Pain Medicine

## 2024-06-02 NOTE — Progress Notes (Signed)
 Michelle Marks                                          MRN: [id number]   06/02/2024   The [facility] Quality Team Specialist reviewed this patient medical record for the purposes of chart review for care gap closure. The following were reviewed: chart review for care gap closure-kidney health evaluation for diabetes:eGFR  and uACR.    [facility]BCI Quality Team

## 2024-06-08 ENCOUNTER — Telehealth: Payer: Self-pay | Admitting: Nurse Practitioner

## 2024-06-08 NOTE — Telephone Encounter (Signed)
 Copied from CRM #[id number]. Topic: Appointments - Scheduling Inquiry for Clinic >> Jun 08, 2024  3:26 PM Willma R wrote: Reason for CRM: Patient was scheduled for a telephone call for her AWV on 02/26, got a message today it was changed to in-person. Patient is calling to inquire why it was changed and if it can go back to a telephone call.  Can be reached at [phone]

## 2024-07-03 NOTE — Patient Instructions (Signed)

## 2024-07-05 ENCOUNTER — Ambulatory Visit: Admitting: Nurse Practitioner

## 2024-07-06 ENCOUNTER — Ambulatory Visit (INDEPENDENT_AMBULATORY_CARE_PROVIDER_SITE_OTHER): Admitting: Nurse Practitioner

## 2024-07-06 ENCOUNTER — Encounter: Payer: Self-pay | Admitting: Nurse Practitioner

## 2024-07-06 DIAGNOSIS — M87051 Idiopathic aseptic necrosis of right femur: Secondary | ICD-10-CM

## 2024-07-06 DIAGNOSIS — R252 Cramp and spasm: Secondary | ICD-10-CM | POA: Diagnosis not present

## 2024-07-06 DIAGNOSIS — E1169 Type 2 diabetes mellitus with other specified complication: Secondary | ICD-10-CM

## 2024-07-06 DIAGNOSIS — L4050 Arthropathic psoriasis, unspecified: Secondary | ICD-10-CM

## 2024-07-06 DIAGNOSIS — E559 Vitamin D deficiency, unspecified: Secondary | ICD-10-CM | POA: Diagnosis not present

## 2024-07-06 DIAGNOSIS — E1129 Type 2 diabetes mellitus with other diabetic kidney complication: Secondary | ICD-10-CM | POA: Diagnosis not present

## 2024-07-06 DIAGNOSIS — I890 Lymphedema, not elsewhere classified: Secondary | ICD-10-CM

## 2024-07-06 DIAGNOSIS — E039 Hypothyroidism, unspecified: Secondary | ICD-10-CM

## 2024-07-06 DIAGNOSIS — N950 Postmenopausal bleeding: Secondary | ICD-10-CM | POA: Diagnosis not present

## 2024-07-06 DIAGNOSIS — R030 Elevated blood-pressure reading, without diagnosis of hypertension: Secondary | ICD-10-CM | POA: Diagnosis not present

## 2024-07-06 DIAGNOSIS — G894 Chronic pain syndrome: Secondary | ICD-10-CM | POA: Diagnosis not present

## 2024-07-06 LAB — BAYER DCA HB A1C WAIVED: HB A1C (BAYER DCA - WAIVED): 6.9 % — ABNORMAL HIGH (ref 4.8–5.6)

## 2024-07-06 MED ORDER — LEVOTHYROXINE SODIUM 75 MCG PO TABS: 75.0000 ug | ORAL_TABLET | Freq: Every day | ORAL | 2 refills | Status: AC

## 2024-07-06 MED ORDER — TORSEMIDE 100 MG PO TABS: 100.0000 mg | ORAL_TABLET | Freq: Every day | ORAL | 2 refills | Status: AC

## 2024-07-06 NOTE — Assessment & Plan Note (Signed)
 Stable, currently follows with pain management. Continue this collaboration.

## 2024-07-06 NOTE — Assessment & Plan Note (Signed)
 Chronic, ongoing.  Continue current medication regimen and adjust as needed.  Lipid panel and CMP today.

## 2024-07-06 NOTE — Assessment & Plan Note (Signed)
 Follows with pain management and ortho, continue these collaborations. Recent notes reviewed.

## 2024-07-06 NOTE — Assessment & Plan Note (Signed)
 Chronic, ongoing.  Continue weekly Vitamin D , refills sent up to date.  Check Vit D level today and recommend she obtain DEXA as ordered last visit.

## 2024-07-06 NOTE — Assessment & Plan Note (Signed)
 Intermittent. Check labs today, if reassuring will get her into neurology for further assessment of this.

## 2024-07-06 NOTE — Assessment & Plan Note (Signed)
Chronic, ongoing.  Continue current medication regimen and adjust regimen as needed.  Thyroid labs up to date.

## 2024-07-06 NOTE — Assessment & Plan Note (Signed)
 A1c 6.9% today on labs.  She does not want to start medication as of yet, wishes to continue focus on diet reducing carbohydrate and sugar + increasing chair exercises.  Will trial this, but if started to have elevations in glucose or weight gain with the Megace  she is starting she will alert provider and we will then start Ozempic or Trulicity.  Would avoid SGLT2 due to her difficulty getting to bathroom and concern for fall.  Urine ALB ordered, but she has not returned this to office as of yet. - No ACE/ARB or statin - refuses - Refuses vaccinations - Not Up To Date eye exam.  Foot exam up to date.

## 2024-07-06 NOTE — Assessment & Plan Note (Signed)
 Ongoing, will continue current medication regimen and collaboration with gynecology.

## 2024-07-06 NOTE — Assessment & Plan Note (Signed)
 Chronic, ongoing.  Continue collaboration with vascular.  At this time continue Torsemide as ordered by previous provider, although limited evidence for this being beneficial in lymphedema. Recommend continue compression wraps at home and take Tylenol 1000 MG TID as needed.

## 2024-07-06 NOTE — Assessment & Plan Note (Signed)
 Chronic, stable.  BP at goal in office today.  Recommend she monitor BP at least a few mornings a week at home and document.  DASH diet at home.  Continue current medication regimen and adjust as needed.  Labs today: CMP and lipid.  Urine ALB ordered and will obtain outpatient.

## 2024-07-06 NOTE — Assessment & Plan Note (Signed)
 Chronic, ongoing. Mainly to lower back and hips. She is following with pain management, continue this collaboration. She would like oral pain meds in future to help pain at night, Tramadol  she reports has never worked. Continue to collaborate with pain management and orthopedics.

## 2024-07-06 NOTE — Progress Notes (Signed)
 "  BP 118/71 (BP Location: Right Arm, Cuff Size: Large)   Pulse 79   Temp 97.6 F (36.4 C) (Oral)   Ht 5' 4 (1.626 m)   SpO2 97%   BMI 47.72 kg/m    Subjective:    Patient ID: Michelle Marks, female    DOB: 1951/01/08, 74 y.o.   MRN: [id number]  HPI: Michelle Marks is a 74 y.o. female  Chief Complaint  Patient presents with   Diabetes   Hyperlipidemia   Hypertension   Body Twitching    Patient states that she has been experiencing involuntary body twitching or wiggling in the last month. States she has experienced this previously but the feelings stopped. States they came back a month ago.    DIABETES Last A1c was 6.9% in August. She is taking Megestrol  40 MG daily from GYN for postmenopausal, goes for biopsy 25th of next month. Continues to spot. Hypoglycemic episodes:no Polydipsia/polyuria: no Visual disturbance: no Chest pain: no Paresthesias: no Glucose Monitoring: no  Accucheck frequency: every other day  Fasting glucose: 120 range  Post prandial:  Evening: 130 to 160  Before meals: Taking Insulin?: no  Long acting insulin:  Short acting insulin: Blood Pressure Monitoring: not checking Retinal Examination: Up To Date Foot Exam: Up to Date Diabetic Education: Not Completed Pneumovax: Up to Date Influenza: Up to Date Aspirin: no   HYPERTENSION / HYPERLIPIDEMIA Takes Torsemide  100 MG daily.    Reports having twitching/spasm episodes that are involuntary. Had these in the past and they improved, but over past month they returned. Recently had episode to right foot, about two weeks ago. Sometimes the whole body will tremor/spasm.  Feels like arms are jello and wavy. No episodes of passing out with this. Recent K+ level was low. She reports drinking V8 juice to try to get levels up. Initially had these episodes a good 7 years ago.  Spasms are not painful. Often these last seconds and go away. They have gotten stronger. May have 0 to 2 episodes a day. Satisfied  with current treatment? yes Duration of hypertension: chronic BP monitoring frequency: not checking BP range:  BP medication side effects: no Duration of hyperlipidemia: chronic Cholesterol medication side effects: no Cholesterol supplements: none Medication compliance: good compliance Aspirin: no Recent stressors: no Recurrent headaches: no Visual changes: no Palpitations: no Dyspnea: no Chest pain: no Lower extremity edema: at baseline Dizzy/lightheaded: no   HYPOTHYROIDISM Taking Levothyroxine  75 MCG daily. Thyroid  control status:stable Satisfied with current treatment? yes Medication side effects: no Medication compliance: good compliance Etiology of hypothyroidism: unknown Recent dose adjustment:no Fatigue: no Cold intolerance: no Heat intolerance: no Weight gain: no Weight loss: no Constipation: occasional Palpitations: no Lower extremity edema: yes Anxiety/depressed mood: no   BACK PAIN Underlying lymphedema, did go to vascular with last visit on 07/23/22. Uses compression pumps and wraps. Saw pain management on 05/05/24, has received injections.  Tramadol  did not offer benefit in past. Tried Gabapentin , Lyrica , Tizanidine  all without benefit to pain.  Does have elevation of uric acid on labs, did not try Allopurinol . Takes Vit D for low levels.  MRI lumbar spine 2006 did note lateral disc extrusion L5-S1 with nerve root compression + degenerative changes to multiple areas and ligamentum flavum.   Duration: weeks Mechanism of injury: unknown Location: bilateral and low back Onset: gradual Severity: 6/10 Quality: sharp, shooting, and throbbing Frequency: constant Radiation: buttocks Aggravating factors: lying down Alleviating factors: nothing at present Status: fluctuating Treatments attempted: Tramadol , Gabapentin ,  Lyrica , Tylenol , Hempvana Relief with NSAIDs?: No NSAIDs Taken Nighttime pain:  no Paresthesias / decreased sensation:  no Bowel / bladder  incontinence:  no Fevers:  no Dysuria / urinary frequency:  no      07/06/2024    2:14 PM 05/05/2024    4:23 PM 03/17/2024   11:28 AM 03/12/2024    3:53 PM 02/06/2024    1:13 PM  Depression screen PHQ 2/9  Decreased Interest 0 0 0 0 0  Down, Depressed, Hopeless 1 0 0 0 0  PHQ - 2 Score 1 0 0 0 0  Altered sleeping 0 0   3  Tired, decreased energy 1 0   2  Change in appetite 0 0   0  Feeling bad or failure about yourself  0 0   0  Trouble concentrating 0 0   0  Moving slowly or fidgety/restless 0 0   0  Suicidal thoughts 0 0   0  PHQ-9 Score 2 0   5   Difficult doing work/chores Not difficult at all    Not difficult at all     Data saved with a previous flowsheet row definition       07/06/2024    2:14 PM 05/05/2024    4:23 PM 02/06/2024    1:14 PM 10/16/2023    4:15 PM  GAD 7 : Generalized Anxiety Score  Nervous, Anxious, on Edge 0 0  1  1   Control/stop worrying 1 0  1  1   Worry too much - different things 0 0  0  1   Trouble relaxing 0 0  0  1   Restless 0 0  0  0   Easily annoyed or irritable 1 0  0  0   Afraid - awful might happen 0 0  0  0   Total GAD 7 Score 2 0 2 4  Anxiety Difficulty Somewhat difficult  Not difficult at all Not difficult at all     Data saved with a previous flowsheet row definition   Relevant past medical, surgical, family and social history reviewed and updated as indicated. Interim medical history since our last visit reviewed. Allergies and medications reviewed and updated.  Review of Systems  Constitutional:  Negative for activity change, appetite change, diaphoresis, fatigue and fever.  Respiratory:  Negative for cough, chest tightness and shortness of breath.   Cardiovascular:  Positive for leg swelling. Negative for chest pain and palpitations.  Gastrointestinal: Negative.   Endocrine: Negative for cold intolerance, heat intolerance, polydipsia, polyphagia and polyuria.  Musculoskeletal:  Positive for arthralgias and back pain.   Neurological: Negative.   Psychiatric/Behavioral: Negative.     Per HPI unless specifically indicated above     Objective:    BP 118/71 (BP Location: Right Arm, Cuff Size: Large)   Pulse 79   Temp 97.6 F (36.4 C) (Oral)   Ht 5' 4 (1.626 m)   SpO2 97%   BMI 47.72 kg/m   Wt Readings from Last 3 Encounters:  05/05/24 278 lb (126.1 kg)  04/28/24 278 lb (126.1 kg)  04/07/24 279 lb 14.4 oz (127 kg)    Physical Exam Vitals and nursing note reviewed.  Constitutional:      General: She is awake. She is not in acute distress.    Appearance: She is well-developed and well-groomed. She is obese. She is not ill-appearing or toxic-appearing.     Comments: In wheelchair per baseline.  HENT:  Head: Normocephalic.     Right Ear: Hearing and external ear normal.     Left Ear: Hearing and external ear normal.  Eyes:     General: Lids are normal.        Right eye: No discharge.        Left eye: No discharge.     Conjunctiva/sclera: Conjunctivae normal.     Pupils: Pupils are equal, round, and reactive to light.  Neck:     Thyroid : No thyromegaly.     Vascular: No carotid bruit.  Cardiovascular:     Rate and Rhythm: Normal rate and regular rhythm.     Heart sounds: Normal heart sounds. No murmur heard.    No gallop.     Comments: Wraps in place. Pulmonary:     Effort: Pulmonary effort is normal. No accessory muscle usage or respiratory distress.     Breath sounds: Normal breath sounds. No decreased breath sounds, wheezing or rales.  Abdominal:     General: Bowel sounds are normal. There is no distension.     Palpations: Abdomen is soft.     Tenderness: There is no abdominal tenderness.  Musculoskeletal:     Cervical back: Normal range of motion and neck supple.     Right lower leg: 2+ Edema present.     Left lower leg: 2+ Edema present.  Lymphadenopathy:     Cervical: No cervical adenopathy.  Skin:    General: Skin is warm and dry.  Neurological:     Mental Status: She  is alert and oriented to person, place, and time.     Deep Tendon Reflexes: Reflexes are normal and symmetric.     Reflex Scores:      Brachioradialis reflexes are 2+ on the right side and 2+ on the left side.      Patellar reflexes are 2+ on the right side and 2+ on the left side. Psychiatric:        Attention and Perception: Attention normal.        Mood and Affect: Mood normal.        Speech: Speech normal.        Behavior: Behavior normal. Behavior is cooperative.        Thought Content: Thought content normal.    Results for orders placed or performed in visit on 03/17/24  Surgical pathology   Collection Time: 03/17/24 12:00 AM  Result Value Ref Range   SURGICAL PATHOLOGY      SURGICAL PATHOLOGY [facility] [address], [address] [place], [place] [address] Telephone [phone] or [phone] Fax [phone]  REPORT OF SURGICAL PATHOLOGY   Accession #: [id number] Patient Name: JOLEIGH, MINEAU Visit # :   MRN: [id number] Physician: Dasie Tinnie MORTON DOB/Age 10/02/1950 (Age: 75) Gender: F Collected Date: 03/17/2024 Received Date: 03/17/2024  FINAL DIAGNOSIS       1. Endometrium, biopsy,  :       -  DISORDERED PROLIFERATIVE ENDOMETRIUM WITH FOCAL COMPLEX HYPERPLASIA WITHOUT      ATYPIA.      -  NEGATIVE FOR ENDOMETRIAL INTRAEPITHELIAL NEOPLASIA AND CARCINOMA ON SECTIONS      EXAMINED.       DATE SIGNED OUT: 03/19/2024 ELECTRONIC SIGNATURE : Legolvan Do, Mark, Pathologist, Electronic Signature  MICROSCOPIC DESCRIPTION  CASE COMMENTS STAINS USED IN DIAGNOSIS: H&E    CLINICAL HISTORY  SPECIMEN(S) OBTAINED 1. Endometrium, biopsy,  SPECIMEN COMMENTS: SPECIMEN CLINICAL INFORMATION: 1. PMB     Gross Description 1.  Received in formalin is a 2 x 1.8 x 0.3 cm aggregate of red-brown clot material and tan tissue submitted entirely in 1 block. mb 03-18-24        Report signed out from the following location(s) . [facility] [address] [place], [place] [address] CLIA #: [id number]  [facility] [address] [place], [place] [address] CLIA #: [id number]       Assessment & Plan:   Problem List Items Addressed This Visit       Cardiovascular and Mediastinum   White coat syndrome without diagnosis of hypertension   Chronic, stable.  BP at goal in office today.  Recommend she monitor BP at least a few mornings a week at home and document.  DASH diet at home.  Continue current medication regimen and adjust as needed.  Labs today: CMP and lipid.  Urine ALB ordered and will obtain outpatient.       Relevant Medications   torsemide  (DEMADEX ) 100 MG tablet   Other Relevant Orders   Microalbumin, Urine Waived     Endocrine   Type 2 diabetes mellitus with proteinuria (HCC)   A1c 6.9% today on labs.  She does not want to start medication as of yet, wishes to continue focus on diet reducing carbohydrate and sugar + increasing chair exercises.  Will trial this, but if started to have elevations in glucose or weight gain with the Megace  she is starting she will alert provider and we will then start Ozempic or Trulicity.  Would avoid SGLT2 due to her difficulty getting to bathroom and concern for fall.  Urine ALB ordered, but she has not returned this to office as of yet. - No ACE/ARB or statin - refuses - Refuses vaccinations - Not Up To Date eye exam.  Foot exam up to date.      Relevant Orders   Bayer DCA Hb A1c Waived   Microalbumin, Urine Waived   Type 2 diabetes mellitus associated with morbid obesity (HCC) - Primary   A1c 6.9% today on labs.  She does not want to start medication as of yet, wishes to continue focus on diet reducing carbohydrate and sugar + increasing chair exercises.  Will trial this, but if started to have elevations in glucose or weight gain with the Megace  she is starting she will alert provider and we will then start Ozempic or Trulicity.  Would avoid  SGLT2 due to her difficulty getting to bathroom and concern for fall.  Urine ALB ordered, but she has not returned this to office as of yet. - No ACE/ARB or statin - refuses - Refuses vaccinations - Not Up To Date eye exam.  Foot exam up to date.      Relevant Orders   Bayer DCA Hb A1c Waived   Microalbumin, Urine Waived   Hypothyroidism   Chronic, ongoing.  Continue current medication regimen and adjust regimen as needed.  Thyroid  labs up to date.      Relevant Medications   levothyroxine  (SYNTHROID ) 75 MCG tablet   Hyperlipidemia associated with type 2 diabetes mellitus (HCC)   Chronic, ongoing.  Continue current medication regimen and adjust as needed.  Lipid panel and CMP today.        Relevant Medications   torsemide  (DEMADEX ) 100 MG tablet   Other Relevant Orders   Bayer DCA Hb A1c Waived   Comprehensive metabolic panel with GFR   Lipid Panel w/o Chol/HDL Ratio  Musculoskeletal and Integument   Psoriatic arthritis (HCC) (Chronic)   Stable, currently follows with pain management. Continue this collaboration.      Avascular necrosis of bone of hip (HCC) (Right) (Chronic)   Follows with pain management and ortho, continue these collaborations. Recent notes reviewed.        Other   Chronic pain syndrome (Chronic)   Chronic, ongoing. Mainly to lower back and hips. She is following with pain management, continue this collaboration. She would like oral pain meds in future to help pain at night, Tramadol  she reports has never worked. Continue to collaborate with pain management and orthopedics.      Vitamin D  deficiency   Chronic, ongoing.  Continue weekly Vitamin D , refills sent up to date.  Check Vit D level today and recommend she obtain DEXA as ordered last visit.      Relevant Orders   VITAMIN D  25 Hydroxy (Vit-D Deficiency, Fractures)   Postmenopausal bleeding   Ongoing, will continue current medication regimen and collaboration with gynecology.       Lymphedema in adult patient   Chronic, ongoing.  Continue collaboration with vascular.  At this time continue Torsemide  as ordered by previous provider, although limited evidence for this being beneficial in lymphedema. Recommend continue compression wraps at home and take Tylenol  1000 MG TID as needed.        Hand or foot spasms   Intermittent. Check labs today, if reassuring will get her into neurology for further assessment of this.        Follow up plan: Return in about 3 months (around 10/04/2024) for T2DM, HTN/HLD, Hypothyroid, Chronic Pain.      "

## 2024-07-07 ENCOUNTER — Ambulatory Visit: Payer: Self-pay | Admitting: Nurse Practitioner

## 2024-07-07 LAB — LIPID PANEL W/O CHOL/HDL RATIO
Cholesterol, Total: 158 mg/dL (ref 100–199)
HDL: 39 mg/dL — ABNORMAL LOW
LDL Chol Calc (NIH): 95 mg/dL (ref 0–99)
Triglycerides: 137 mg/dL (ref 0–149)
VLDL Cholesterol Cal: 24 mg/dL (ref 5–40)

## 2024-07-07 LAB — COMPREHENSIVE METABOLIC PANEL WITH GFR
ALT: 15 IU/L (ref 0–32)
AST: 14 IU/L (ref 0–40)
Albumin: 4 g/dL (ref 3.8–4.8)
Alkaline Phosphatase: 139 IU/L — ABNORMAL HIGH (ref 49–135)
BUN/Creatinine Ratio: 25 (ref 12–28)
BUN: 18 mg/dL (ref 8–27)
Bilirubin Total: 0.6 mg/dL (ref 0.0–1.2)
CO2: 25 mmol/L (ref 20–29)
Calcium: 9.3 mg/dL (ref 8.7–10.3)
Chloride: 98 mmol/L (ref 96–106)
Creatinine, Ser: 0.72 mg/dL (ref 0.57–1.00)
Globulin, Total: 2.8 g/dL (ref 1.5–4.5)
Glucose: 116 mg/dL — ABNORMAL HIGH (ref 70–99)
Potassium: 3.4 mmol/L — ABNORMAL LOW (ref 3.5–5.2)
Sodium: 143 mmol/L (ref 134–144)
Total Protein: 6.8 g/dL (ref 6.0–8.5)
eGFR: 88 mL/min/1.73

## 2024-07-07 LAB — VITAMIN D 25 HYDROXY (VIT D DEFICIENCY, FRACTURES): Vit D, 25-Hydroxy: 25.6 ng/mL — ABNORMAL LOW (ref 30.0–100.0)

## 2024-07-07 NOTE — Progress Notes (Signed)
 Contacted via MyChart  Good afternoon Michelle Marks, your labs have returned: - Kidney function, creatinine and eGFR, remains normal, as is liver function, AST and ALT. Potassium remains low and I would recommend a supplement again, would you like to try powder supplement instead that you can mix in water? - Lipid panel continues to show LDL above goal. I do continue to recommend a statin daily and if you would like to try a low dose one let me know. - Vitamin D  remains a little low, ensure you are taking Vitamin D3 2000 units daily. Any questions? Keep being amazing!!  Thank you for allowing me to participate in your care.  I appreciate you. Kindest regards, Bernisha Verma

## 2024-07-09 MED ORDER — ATORVASTATIN CALCIUM 80 MG PO TABS: 80.0000 mg | ORAL_TABLET | Freq: Every day | ORAL | 3 refills | Status: AC

## 2024-07-23 ENCOUNTER — Telehealth: Payer: Self-pay | Admitting: *Deleted

## 2024-07-23 NOTE — Telephone Encounter (Signed)
" °  Patient called to report a new onset of vaginal cramps; denies any heavy bleeding at this time.. She is scheduled on the 25th to see Dr. Elby to obtain an endometrial biopsy to follow-up on the post menopausal bleeding and hyperplasia. She has been taking the megace  as directed. She is inquiring is a sooner apt is needed with Dr. Elby given the new onset of vaginal cramps. Pt aware that Dr. Mancil is scheduled for next week to be in the clinic. She was quite clear that she would only want to see Dr. Elby. I told the patient that I would send her concerns to Tinnie Dawn, NP to advise if a sooner apt is needed at this time. "

## 2024-08-09 ENCOUNTER — Ambulatory Visit: Admitting: Nurse Practitioner

## 2024-08-11 ENCOUNTER — Inpatient Hospital Stay

## 2024-08-12 ENCOUNTER — Ambulatory Visit: Payer: Medicare Other

## 2024-10-06 ENCOUNTER — Ambulatory Visit: Admitting: Nurse Practitioner
